# Patient Record
Sex: Male | Born: 1970 | Race: White | Hispanic: No | Smoking: Never smoker
Health system: Northeastern US, Academic
[De-identification: ages and names within clinical notes are randomized; demographics above are authoritative.]

## PROBLEM LIST (undated history)

## (undated) ENCOUNTER — Encounter

---

## 2015-11-04 ENCOUNTER — Ambulatory Visit

## 2015-11-17 ENCOUNTER — Ambulatory Visit

## 2016-01-11 ENCOUNTER — Ambulatory Visit

## 2016-01-11 NOTE — Progress Notes (Signed)
Visit Type:  Annual Physical  Referring Provider:  [name]  Primary Provider:  [name]      History of Present Illness:  This very pleasant 45 year old Morris came in for an annual checkup. Since his last visit he stopped his blood pressure medications. He has been losing some hair in a male pattern and read that some of the pills might contribute to this. Nevertheless his blood pressure readings have been high. His biggest complaint is that of fatigue. He is able to work. He sleeps well at night. He does not give a typical symptoms consequent to possible sleep apnea. He denies any chest pain or palpitations. He would like to lose some weight. He would like his testosterone checked again. He has had no gouty attacks on the allopurinol. His bowels have been good. He is not happy that I am retiring.  He continues to be the primary caregiver to his young daughters ages 74 and 27.      Current Problems- Reviewed during today's visit  ARTHRITIS  LIBIDO, DECREASED  ANXIETY  GOUT  UMBILICAL HERNIA  COLECTOMY, HX OF  HYPERTENSION  DIVERTICULOSIS, COLON  FATTY LIVER DISEASE  HYPERCHOLESTEROLEMIA  ROTATOR CUFF TEAR  PILONIDAL CYST    Current Medications- Reviewed during today's visit  VIAGRA TAB 100MG  (SILDENAFIL CITRATE): 1/2 - 1 by mouth daily as needed 1 hour prior to intercourse  AMLODIPINE BESY-BENAZEPRIL HCL 10-40 MG CAPS: one p.o. daily  ALLOPURINOL 300 MG TABS: take one by mouth daily  Current Allergies- Reviewed during today's visit  NO KNOWN ALLERGIES        Review of Systems   General: marked fatigue. Nevertheless he gets everything done for himself, his daughters and the household  Eyes: Denies visual change or blurring, eye pain.   Ears/Nose/Throat: Denies earache, decreased hearing, difficulty swallowing.   Cardiovascular: Denies chest pain or pressure, palpitations, shortness of breath.   Respiratory: Denies dry cough, productive cough, shortness of breath, wheezing.   Gastrointestinal: Denies acid indigestion,  nausea, vomiting, diarrhea, abdominal pain, change in bowel habits, constipation, mucous or blood in stools.   Musculoskeletal: Denies muscle cramps or aches, muscle weakness, morning stiffness, joint pain, joint swelling.   Skin: Denies dry skin, rash, skin ulcers, suspicious lesions, hx of skin cancer.   Psychiatric: Denies anxiety, depression, insomnia.     Vital Signs     Weight: 238 lb. Height: 70.5  in.    BMI: 33.79  BSA: 2.26    Wt chg: -1  Weight: 239 lbs   BMI: 33.93 on 08/21/2014  Pulse rate: 57    Pulse rhythm: regular    Respirations: 14  On Oxygen? No  BP: 164/108 - off medication, sitting right arm      Patient is not experiencing pain    Comments: electrocardiogram shows a sinus bradycardia. There is minor T-wave blunting. No diagnostic changes noted  Medications and Allergies Reviewed    Signed: Luz Brazen MD....January 11, 2016 3:12 PM  PHQ 2    Over the last 2 weeks, how often have you been bothered by any of the following problems?  1. Little interest or pleasure in doing things:  0   - Not at all  2. Feeling down, depressed, or hopeless:  0   - Not at all        Physical Exam    General:      pleasant, slightly heavyset Morris in no acute distress.  Head:      normocephalic and atraumatic.  Eyes:      anicteric  Ears:      normal conversational hearing  Nose:      no deformity, discharge, inflammation, or lesions.    Mouth:      no deformity or lesions with good dentition.    Neck:      no masses, thyromegaly, or abnormal cervical nodes.    Chest Wall:      no deformities or breast masses noted.    Breasts:      no masses or gynecomastia noted.  slightly endomorphic  Lungs:      clear bilaterally to auscultation.    Heart:      non-displaced PMI, chest non-tender; regular rate and rhythm, S1, S2 without murmurs, rubs, or gallops  Abdomen:      well healed scar from surgery. Nontender  Genitalia:      normal male, testes descended bilaterally without masses, no hernias, no varicoceles noted.   circumcised.    Msk:      no deformity or scoliosis noted of thoracic or lumbar spine.    Pulses:      pulses normal in all 4 extremities.    Extremities:      no clubbing, cyanosis, edema, or deformity noted with normal full range of motion of all joints.    Neurologic:      no focal deficits, cranial nerves II-XII grossly intact with normal sensation, reflexes, coordination, muscle strength and tone.    Skin:      intact without lesions or rashes.  tattoos on right arm and upper back  Cervical Nodes:      no significant adenopathy.    Axillary Nodes:      no significant adenopathy.    Inguinal Nodes:      no significant adenopathy.    Psych:      declines feeling depressed but he certainly has complaints of low energy level    Test Results:     WBC:     5.9    (08/28/2014)  Hemoglobin:    14.6    (08/28/2014)  Hematocrit:    41.9    (08/28/2014)  MCV:     92.7    (08/28/2014)  MCH:     32.3    (08/28/2014)  PLatelets:    189    (08/28/2014)  ESR:     12    (09/25/2013)  TSH Ult:    0.77    (08/28/2014)  Vit D 25-OH:    30    (01/20/2010)  Total Prot:    7.6    (08/28/2014)  Albumin:    4.4    (08/28/2014)  AST:     30    (08/28/2014)  ALT:     66    (08/28/2014)  ALK PHOS:    57    (08/28/2014)  Total Bilirubin:   0.4    (08/28/2014)  Total Cholesterol:   190    (08/28/2014)  LDL:     96    (08/28/2014)  LDL Direct:    121    09/25/2013)  HDL:     23    (08/28/2014)  Non HDL:    167    08/28/2014)  Triglycerides:    356    (08/28/2014)  Cardiac CRP:    2.7 mg/L    (01/20/2010)  Glucose:    98    (08/28/2014)  Sodium:    141    (08/28/2014)  Potassium:  4.4    (08/28/2014)  Chloride:    104    (08/28/2014)  Bicarbonate:    23    (08/28/2014)  BUN:     20    (08/28/2014)  Creatinine:    1.2    (08/28/2014)  eGFR:     65    (08/28/2014)  Serum Calcium:   9.3    (08/28/2014)  Uric Acid:    4.1    (08/28/2014)       Assessment and Plan:      ~ LIBIDO, DECREASED (R37):    will recheck a testosterone and thyroid  level     ~ GOUT (M10.9):    no problems on allopurinol alone     ~ HYPERTENSION (I10):    terrible blood pressure control off medication. He is willing to resume        Medications Removed Today:   PREDNISONE 10 MG TABS (PREDNISONE) 3 by mouth daily for 5 days for gout attack  PROBENECID 500 MG TABS (PROBENECID) one by mouth twice a day  METOPROLOL SUCCINATE 100 MG  TB24 (METOPROLOL SUCCINATE) take one by mouth each day    New/Revised  Medications Today:   AMLODIPINE BESY-BENAZEPRIL HCL 10-40 MG CAPS (AMLODIPINE BESY-BENAZEPRIL HCL) one p.o. daily          Care Plan  Plan: check labs and hormone level. Resume Lotrel. Weight reduction.    Prescriptions:  ALLOPURINOL 300 MG TABS (ALLOPURINOL) take one by mouth daily  #90 x 3   Entered and Authorized by: Luz Brazen MD   Signed by: Luz Brazen MD on 01/11/2016   Method used: Print then Give to Patient   RxID: [id number]  VIAGRA TAB 100MG  (SILDENAFIL CITRATE) 1/2 - 1 by mouth daily as needed 1 hour prior to intercourse  #18 x 3   Entered and Authorized by: Luz Brazen MD   Signed by: Luz Brazen MD on 01/11/2016   Method used: Print then Give to Patient   RxID: [id number]  AMLODIPINE BESY-BENAZEPRIL HCL 10-40 MG CAPS (AMLODIPINE BESY-BENAZEPRIL HCL) one p.o. daily  #90 x 3   Entered and Authorized by: Luz Brazen MD   Signed by: Luz Brazen MD on 01/11/2016   Method used: Print then Give to Patient   RxID: [id number]

## 2016-01-11 NOTE — Progress Notes (Signed)
Orders Added    Orders:  Added new Test order of CBC -CBC Only (No Diff)** (CBCO) - Signed  Added new Test order of BMP - Basic Metabolic Panel (BMP) - Signed  Added new Test order of UAR -Routine Urine (Micro Reflex) (UAR) - Signed  Added new Test order of TSHR -TSHR with Reflex** (TSHR) - Signed  Added new Test order of TESTO - Testosterone (TESTO) - Signed  Added new Test order of URIC - Uric Acid (URIC) - Signed  Added new Test order of LIVER - Liver Function Panel (LIVER) - Signed  Added new Test order of LIPID -Lipid Panel** (LIPR) - Signed  Added new Test order of GLYCO - A1C** (GLYCO.) - Signed

## 2016-01-15 ENCOUNTER — Ambulatory Visit

## 2016-01-15 LAB — HX RTN. URINE WITH REFLEX
HX ASCORBIC ACID URINE: NEGATIVE
HX URINE BILE: NEGATIVE
HX URINE BLOOD: NEGATIVE
HX URINE ESTERASE: NEGATIVE
HX URINE GLUCOSE: NEGATIVE
HX URINE KETONES: NEGATIVE
HX URINE NITRITE: NEGATIVE
HX URINE PH: 6 (ref 5.0–8.0)
HX URINE PROTEIN: NEGATIVE
HX URINE SPECIFIC GRAVITY: 1.018 (ref 1.003–1.03)
HX UROBILINOGEN, URINE: NEGATIVE

## 2016-01-15 LAB — HX LIVER FUNCTION PANELX
HX ALBUMIN: 4.3 g/dL (ref 3.2–4.9)
HX ALKALINE PHOSPHATASE: 58 U/L (ref 30.0–117.0)
HX ALT: 72 U/L — ABNORMAL HIGH (ref 0.0–40.0)
HX AST: 38 U/L — ABNORMAL HIGH (ref 0.0–37.0)
HX DIRECT BILIRUBIN: <0.2 (ref 0.0–0.3)
HX TOTAL BILIRUBIN: 0.4 mg/dL (ref 0.2–1.2)
HX TOTAL PROTEIN: 7.7 g/dL (ref 6.5–8.4)

## 2016-01-15 LAB — HX BASIC METABOLIC PANELX
HX ANION GAP: 14 mmol/L (ref 9.0–19.0)
HX BICARBONATE: 23 mmol/L (ref 22.0–29.0)
HX BUN: 20 mg/dL (ref 6.0–20.0)
HX CALCIUM: 9.1 mg/dL (ref 8.5–10.5)
HX CHLORIDE: 99 mmol/L (ref 98.0–110.0)
HX CREATININE: 1.1 mg/dL (ref 0.4–1.2)
HX GLOMERULAR FILTRATION RATE: 72
HX GLUCOSE: 105 mg/dL — ABNORMAL HIGH (ref 70.0–100.0)
HX HEMOLYSIS INDEX: 16 mg/dL (ref 0.0–50.0)
HX ICTERIC INDEX: 1 (ref 0.0–2.0)
HX LIPEMIC INDEX: 14 mg/dL (ref 0.0–40.0)
HX POTASSIUM: 4.3 mmol/L (ref 3.6–5.3)
HX SODIUM: 136 mmol/L — ABNORMAL LOW (ref 137.0–146.0)

## 2016-01-15 LAB — HX  COMPLETE BLOOD COUNT
HX HEMATOCRIT: 44.3 % (ref 41.0–53.0)
HX HEMOGLOBIN: 15.3 g/dL (ref 13.5–17.5)
HX MEAN CORP.HEMO.CONC.: 34.5 g/dL (ref 31.0–37.0)
HX MEAN CORPUSCULAR HEMOGLOBIN: 32.3 pg (ref 26.0–34.0)
HX MEAN CORPUSCULAR VOLUME: 93.7 fL (ref 80.0–100.0)
HX MEAN PLATELET VOLUME: 12.3 fL (ref 9.4–12.4)
HX PLATELET COUNT: 163 10*3/uL (ref 150.0–400.0)
HX RED BLOOD COUNT: 4.7 M/uL (ref 4.5–5.9)
HX RED CELL DISTRIBUTION WIDTH SD: 45.7 fL (ref 35.0–51.0)
HX WHITE BLOOD COUNT: 5.7 10*3/uL (ref 4.5–11.0)

## 2016-01-15 LAB — HX LIPID PANEL FASTINGX
HX CHD RISK ASSESMENT FACTORX: 7.7
HX CHOLESTEROL (LIPR): 192 mg/dL (ref <200)
HX HDL CHOLESTEROLX: 25 mg/dL — ABNORMAL LOW (ref 35.0–55.0)
HX LDL CHOLESTEROLX: 101 mg/dL (ref <130)
HX NON HDL CHOLESTEROLX: 167 mg/dL — ABNORMAL HIGH (ref <130)
HX TRIGLYCERIDES: 328 mg/dL — ABNORMAL HIGH (ref <150)

## 2016-01-15 LAB — HX URIC ACID (SERUM): HX URIC ACID (SERUM): 5.1 mg/dL (ref 3.4–7.0)

## 2016-01-15 LAB — HX TESTOSTERONE, TOTAL: HX TESTOSTERONE, TOTAL: 337 ng/dL (ref 249.0–836.0)

## 2016-01-15 LAB — HX GLYCOHEMOGLOBIN
HX ESTIMATED AVERAGE GLUCOSE: 100 mg/dL
HX GLYCOHEMOGLOBIN EQUIVALENT: 0.627
HX HEMOGLOBIN A1C: 5.1 % (ref 4.2–5.8)

## 2016-01-15 LAB — HX TSH WITH REFLEX: HX TSH WITH REFLEX: 0.94 u[IU]/mL (ref 0.27–4.2)

## 2016-01-18 ENCOUNTER — Ambulatory Visit

## 2016-01-18 NOTE — Progress Notes (Signed)
Luz Brazen, MD   [address]   [place], [place] [address]  Office: [phone] Fax: [phone]    January 18, 2016      Timothy Morris  [address]  [place], [place] [address]    Dear Timothy Morris,    I have received the results of your most recent labwork. The results are listed below:     Labs Your Value Normal Result Date   Total Cholesterol: 192 Goal: less than 200 01/15/2016   HDL (good cholesterol):  25 Normal male: 84 - 55   Normal male: 46 - 65 01/15/2016   LDL (bad cholesterol):  101 Goal: less than 130 01/15/2016   Triglycerides: 328 Goal: less than 200 01/15/2016   LDL-direct (bad cholesterol): 121 Goal: less than 130 09/25/2013   Cardiac CRP (helps predict risk of heart disease) 2.7 mg/L 0-1 = low risk  1-3 = average risk  >3 = high risk 01/20/2010   Hemoglobin A1C   (3 month sugar test) 5.1 Normal: 4.2  5.8 01/15/2016   Estimated Average Glucose  (3 month Average) 100 Goal: less than 150 01/15/2016   Urine microalbumin     Normal: < 20       Blood sugar 105 Normal Newborn to 10yr: 60 - 110   Normal 71yr and older: 70-100 01/15/2016   Creatinine (kidney function) 1.1 Normal: 0.4  1.2   01/15/2016   ALT (liver test) 72 Normal male: 0  40   Normal male: 0 - 31 01/15/2016   AST (liver test) 38 Normal male: 0  37   Normal male: 0 - 31 01/15/2016   Hematocrit   (blood count) 44.3 Normal male: 15  52   Normal male: 35- 47 01/15/2016   Vitamin D Level 30 Normal: 30 or more 01/20/2010   TSH (thyroid test)  Normal: 0.27  4.20    TSH (ultra thyroid test) 0.94 Normal: 0.27  4.20 01/15/2016   PSA (prostate test)  Normal: less than 4.0    Uric Acid (high in people with gout) 5.1 Normal male: 3.4 - 7.0   Normal male: 2.4 - 6.0 01/15/2016     Overall stable.  Slight elevation of Triglyceries and liver tests are consistent with some degree of fatty liver disorder.  Testosterone level is fine.  Weight loss and exercise will ameliorate,          Sincerely,        Luz Brazen MD

## 2016-03-08 ENCOUNTER — Ambulatory Visit: Admitting: Internal Medicine

## 2016-03-08 ENCOUNTER — Ambulatory Visit

## 2016-03-08 NOTE — Progress Notes (Signed)
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 N/A      Pt declined Flu and Tdap Injections today            Current Medications- Reviewed during today's visit  VIAGRA TAB 100MG  (SILDENAFIL CITRATE): 1/2 - 1 by mouth daily as needed 1 hour prior to intercourse  AMLODIPINE BESY-BENAZEPRIL HCL 10-40 MG CAPS: Daily for blood pressure  ALLOPURINOL 300 MG TABS: take one by mouth daily  HYDROCHLOROTHIAZIDE 12.5 MG  TABS: take one by mouth once daily in AM for blood pressure    Past Medical History  Hypertension  Gout   ED  Surgical History  Right Arthroscopy x 2   Right Rottor Cuff  RIH  Colectomy Sigmoid Dr [name]   Pilonodal Cyst  Family History  Father Healthy  Estranged  Mother Breast Cancer  RN at [employer]  Siblings  Sisters healthy  2017  Social History  Marital Status: Divorced.Marland KitchenHe ares for the children  Children:  2    Lives With: Childrens  Occupation:  [employer]       Risk Factors  Smoking Status:never smoked  Drug use: no  Alcohol use: no  HIV high risk behavior: no    Exercise: Yes  Exercise Comments:  job aerobic     Caffeine (drinks/day): 1  Sun exposure: rarely  Seatbelt use (%): 100  Family History MI in male age < 76: no  Family History MI in male age < 29: no          Review of Systems   General: Denies fever, chills, sweats, anorexia, fatigue, weakness, malaise, weight loss.   Eyes: Denies visual change or blurring, eye pain.   Ears/Nose/Throat: Denies earache, decreased hearing, difficulty swallowing.   Cardiovascular: Denies chest pain or pressure, palpitations, shortness of breath.   Respiratory: Denies dry cough, productive cough, shortness of breath, wheezing.   Gastrointestinal: Denies acid indigestion, nausea, vomiting, diarrhea, abdominal pain, change in bowel  habits, constipation, mucous or blood in stools.   Musculoskeletal: Complains of hx of gout. Denies muscle cramps or aches, muscle weakness, morning stiffness, joint pain, joint swelling. no joint issues since starting Allopurinol    Skin: Denies dry skin, rash, skin ulcers, suspicious lesions, hx of skin cancer.   Psychiatric: Denies anxiety, depression, insomnia.     Vital Signs     Weight: 237 lb. Height: 70.5  in.    BMI: 33.65  BSA: 2.26    Wt chg: -1  Weight: 238 lbs   BMI: 33.79 on 01/11/2016  Pulse rate: 62  On Oxygen? No  Pulse Ox (SpO2): 97 BP: 124/78 - large cuff, sitting left arm      Patient is not experiencing pain    Medications and Allergies Reviewed    Signed: [name].Marland KitchenMarland KitchenMarland KitchenSeptember 26, 2017 2:58 PM  PHQ 2    Over the last 2 weeks, how often have you been bothered by any of the following problems?  1. Little interest or pleasure in doing things:  0   - Not at all  2. Feeling down, depressed, or hopeless:  0   - Not at all        Physical Exam    General:  well developed, well nourished, in no acute distress.    Lungs:      clear bilaterally to auscultation.    Heart:      regular rhythm and normal rate.      Test Results:     Hemoglobin:    15.3    (01/15/2016)  Hematocrit:    44.3    (01/15/2016)  LDL:     101    (01/15/2016)  Hgb A1C:    5.1    (01/15/2016)  Glucose:    105    (01/15/2016)  Potassium:    4.3    (01/15/2016)  Creatinine:    1.1    (01/15/2016)       Assessment and Plan:      ~ GOUT (M10.9) - Improved    Medications:  ALLOPURINOL 300 MG TABS: take one by mouth daily.      ~ HYPERTENSION (I10) - Improved    blood pressure is monitored and maintained at optimal levels per JNC 8 Guidelines. Meds reviewed for appropriateness.   add HCTZ   Medications:  AMLODIPINE BESY-BENAZEPRIL HCL 10-40 MG CAPS: Daily for blood pressure,  ALLOPURINOL 300 MG TABS: take one by mouth daily.       Problems Reviewed  Med Compliance and SE's: Pt is compliant with meds with no side effects    Patient/Caregiver understand instructions and plan.    New/Revised  Medications Today:   AMLODIPINE BESY-BENAZEPRIL HCL 10-40 MG CAPS (AMLODIPINE BESY-BENAZEPRIL HCL) Daily for blood pressure  HYDROCHLOROTHIAZIDE 12.5 MG  TABS (HYDROCHLOROTHIAZIDE) take one by mouth once daily in AM for blood pressure            Patient Instructions    follow up up in three months    Prescriptions:  HYDROCHLOROTHIAZIDE 12.5 MG  TABS (HYDROCHLOROTHIAZIDE) take one by mouth once daily in AM for blood pressure  #90 x 4   Entered and Authorized by: [name] MD   Signed by: [name] MD on 03/08/2016   Method used: Electronically to      [id number] (retail)     [address]     [place], [place]  [address]      Ph: [phone]     Fax: [phone]   RxID: [id number]

## 2016-06-20 ENCOUNTER — Ambulatory Visit

## 2016-06-20 ENCOUNTER — Ambulatory Visit: Admitting: Internal Medicine

## 2016-06-20 LAB — HX LIVER FUNCTION PANELX
HX ALBUMIN: 4.3 g/dL (ref 3.2–4.9)
HX ALKALINE PHOSPHATASE: 70 U/L (ref 30.0–117.0)
HX ALT: 102 U/L — ABNORMAL HIGH (ref 0.0–40.0)
HX AST: 58 U/L — ABNORMAL HIGH (ref 0.0–37.0)
HX DIRECT BILIRUBIN: <0.2 (ref 0.0–0.3)
HX TOTAL BILIRUBIN: 0.4 mg/dL (ref 0.2–1.2)
HX TOTAL PROTEIN: 7.9 g/dL (ref 6.5–8.4)

## 2016-06-20 LAB — HX LIPID PANEL FASTINGX
HX CHD RISK ASSESMENT FACTORX: 7.9
HX CHOLESTEROL (LIPR): 205 mg/dL — ABNORMAL HIGH (ref <200)
HX HDL CHOLESTEROLX: 26 mg/dL — ABNORMAL LOW (ref 35.0–55.0)
HX NON HDL CHOLESTEROLX: 179 mg/dL — ABNORMAL HIGH (ref <130)
HX TRIGLYCERIDES: 549 mg/dL — ABNORMAL HIGH (ref <150)

## 2016-06-20 LAB — HX CREATININE & EGFRX
HX CREATININE: 1.1 mg/dL (ref 0.4–1.2)
HX GLOMERULAR FILTRATION RATE: 72
HX HEMOLYSIS INDEX: 10 mg/dL (ref 0.0–50.0)
HX ICTERIC INDEX: 1 (ref 0.0–2.0)
HX LIPEMIC INDEX: 53 mg/dL — ABNORMAL HIGH (ref 0.0–40.0)

## 2016-06-20 LAB — HX  COMPLETE BLOOD COUNT
HX HEMATOCRIT: 42.5 % (ref 41.0–53.0)
HX HEMOGLOBIN: 15.2 g/dL (ref 13.5–17.5)
HX MEAN CORP.HEMO.CONC.: 35.8 g/dL (ref 31.0–37.0)
HX MEAN CORPUSCULAR HEMOGLOBIN: 32.8 pg (ref 26.0–34.0)
HX MEAN CORPUSCULAR VOLUME: 91.8 fL (ref 80.0–100.0)
HX MEAN PLATELET VOLUME: 12.7 fL — ABNORMAL HIGH (ref 9.4–12.4)
HX PLATELET COUNT: 180 10*3/uL (ref 150.0–400.0)
HX RED BLOOD COUNT: 4.6 M/uL (ref 4.5–5.9)
HX RED CELL DISTRIBUTION WIDTH SD: 45.8 fL (ref 35.0–51.0)
HX WHITE BLOOD COUNT: 6.4 10*3/uL (ref 4.5–11.0)

## 2016-06-20 LAB — HX ELECTROLYTESX
HX ANION GAP: 12 mmol/L (ref 9.0–19.0)
HX BICARBONATE: 29 mmol/L (ref 22.0–29.0)
HX CHLORIDE: 101 mmol/L (ref 98.0–110.0)
HX POTASSIUM: 4.1 mmol/L (ref 3.6–5.3)
HX SODIUM: 142 mmol/L (ref 137.0–146.0)

## 2016-06-20 LAB — HX BUN: HX BUN: 14 mg/dL (ref 6.0–20.0)

## 2016-06-20 LAB — HX GLUCOSEX: HX GLUCOSE: 86 mg/dL (ref 70.0–100.0)

## 2016-06-20 LAB — HX LDL CHOLESTEROL (DIRECT): HX LDL CHOLESTEROL (DIRECT): 103 mg/dL (ref <130)

## 2016-06-20 NOTE — Progress Notes (Signed)
Visit Type:  Follow-up Visit  Referring Provider:  [name]  Primary Provider:  [name] MD      History of Present Illness:  Timothy Morris is a 47 Years Old Male who presents today for: Follow up  Specialists seen since last visit? None      Pt declined Flu and Tdap Injections today      Past Medical History  Hypertension  Gout   ED  Surgical History  Right Arthroscopy x 2   Right Rottor Cuff  RIH  Colectomy Sigmoid Dr [name]   Pilonodal Cyst  Family History  Father Healthy  Estranged  Mother Breast Cancer  RN at [employer]  Siblings  Sisters Healthy  2018  Social History  Marital Status: Divorced.Marland KitchenHe for the children  Children:  2    Lives With: Childrens  Occupation:  [employer]       Risk Factors  Smoking Status:never smoked  Drug use: no  Alcohol use: no  HIV high risk behavior: no    Exercise: Yes  Exercise Comments:  job aerobic     Caffeine (drinks/day): 1  Sun exposure: rarely  Seatbelt use (%): 100  Family History MI in male age < 11: no  Family History MI in male age < 52: no            Vital Signs     Weight: 246 lb. Height: 70.5  in.    BMI: 34.92  BSA: 2.29    Wt chg: 9  Weight: 237 lbs   BMI: 33.65 on 03/08/2016  Pulse rate: 66  On Oxygen? No  Pulse Ox (SpO2): 98 BP: 164/99 - large cuff, sitting right arm      Patient is not experiencing pain    Medications and Allergies Reviewed    Signed: [name]....June 20, 2016 3:10 PM  PHQ 2    Over the last 2 weeks, how often have you been bothered by any of the following problems?  1. Little interest or pleasure in doing things:  0   - Not at all  2. Feeling down, depressed, or hopeless:  0   - Not at all             Assessment and Plan:

## 2016-06-21 ENCOUNTER — Ambulatory Visit: Admitting: Internal Medicine

## 2016-06-21 NOTE — Telephone Encounter (Signed)
Phone Note -     Outgoing Call    Initial call taken by: [name] MD,  June 21, 2016 7:29 AM  Summary of Call: call and let this patient know all of the recent blood tests were normal except the cholesterol which is high  He should lose weight as we discussed and this will be rechecked in 6 months to see if medication to lower the cholesterol will be necessary         Follow-up #1  Action: Phone call completed, Patient Notified  By: Talbert Cage Lubeck ~ June 21, 2016 9:32 AM

## 2016-12-19 ENCOUNTER — Ambulatory Visit

## 2016-12-19 ENCOUNTER — Ambulatory Visit: Admitting: Internal Medicine

## 2016-12-19 NOTE — Progress Notes (Signed)
Visit Type:  Follow-up Visit  Referring Provider:  [name]  Primary Provider:  [name] MD      History of Present Illness:  Timothy Morris is a 46 Years Old Male who presents today for: Follow up fpor hypertension  Recently by following a weight loss program "[employer]" he has lost over 25 pounds in the last 3 months    Specialists seen since last visit? None      prior to visit chart was reviewed and needed clinical screening, lab testing,  discussed with medical assistant        Current Medications- Reviewed during today's visit  VIAGRA 100 MG ORAL TABLET (SILDENAFIL CITRATE): 1/2 - 1 by mouth daily as needed 1 hour prior to intercourse  AMLODIPINE BESY-BENAZEPRIL HCL 10-40 MG ORAL CAPSULE: Daily for blood pressure  ALLOPURINOL 300 MG ORAL TABLET: take one by mouth daily  HYDROCHLOROTHIAZIDE 12.5 MG ORAL TABLET: take one by mouth once daily in AM for blood pressure    Past Medical History  Hypertension  Gout   ED  Surgical History  Right Arthroscopy x 2   Right Rottor Cuff  RIH  Colectomy Sigmoid Dr [name]   Pilonodal Cyst  Family History  Father Healthy  Estranged  Mother Breast Cancer  RN at [employer]  Siblings  Sisters Healthy  2018  Social History  Marital Status: Divorced.Marland KitchenHe for the children  Children:  2    Lives With: Childrens  Occupation:  [employer]       Risk Factors  Tobacco User: no  Smoking Status:never smoked  Drug use: no  Alcohol use: no  HIV high risk behavior: no    Exercise: Yes  Exercise Comments:  job aerobic     Caffeine (drinks/day): 1  Sun exposure: rarely  Seatbelt use (%): 100  Family History MI in male age < 70: no  Family History MI in male age < 45: no          Review of Systems   General: Denies fever, chills, sweats, anorexia, fatigue, weakness, malaise, weight loss.   Cardiovascular: Denies chest pain or pressure, palpitations, shortness of breath.   Respiratory: Denies dry cough, productive cough, shortness of breath, wheezing.   Gastrointestinal: Denies acid indigestion,  nausea, vomiting, diarrhea, abdominal pain, change in bowel habits, constipation, mucous or blood in stools.   Musculoskeletal: Complains of morning stiffness, back pain. Denies muscle cramps or aches, muscle weakness, joint pain, joint swelling. mid and lower back pain     Vital Signs     Patient: 46 Years Old Male  Height:  70.5 in.  Weight: 209 lbs      Wt Chg: -37 since 06/20/2016  BMI:  29.67        34.92 on 06/20/2016  BP:  124/79 left arm, large cuff, seated     164/99 on 06/20/2016   Pulse:  72         Pulse Ox: 98 %  On Oxygen: No    Patient is experiencing Pain  Location: back/hand    Type: atrain/cramping   Duration: intermittant    Medications and Allergies Reviewed    Signed: [name]....December 19, 2016 2:24 PM    PHQ 2    Over the last 2 weeks, how often have you been bothered by any of the following problems?  1. Little interest or pleasure in doing things:  0   - Not at all  2. Feeling down, depressed, or hopeless:  0   -  Not at all        Physical Exam    General:      well developed, well nourished, in no acute distress.   Lungs:      clear bilaterally to auscultation.    Heart:      regular rhythm and normal rate.    Msk:      no deformity or scoliosis noted of thoracic or lumbar spine.  decreased ROM.  flexion "tight"          Assessment and Plan:      ~ HYPERTENSION (I10) :    today's blood pressure was good and meets JNC 8 guidelines.  Patient is compliant with medications and lifestyle recommendations.  No change to current regimen made today        ~ BACK PAIN, ACUTE (M54.89) :    stretching program provided       Problems Reviewed            Patient Instructions    Follow up in 6 months  Limit Salt intake to 3 grams daily  Exercise at aerobic capacity 150 minutes weekly  A healthy Body Mass Index is 26 or lower  Follow a Mediterranean Diet Plan  Please be sure to register and correspond with me  through the [facility] Patient Portal at [email or web address]  Help Number  [phone]                 ]

## 2017-01-31 ENCOUNTER — Ambulatory Visit

## 2017-02-03 ENCOUNTER — Ambulatory Visit

## 2017-02-03 ENCOUNTER — Ambulatory Visit: Admitting: Internal Medicine

## 2017-02-03 LAB — HX GLUCOSEX: HX GLUCOSE: 83 mg/dL (ref 70.0–100.0)

## 2017-02-03 LAB — HX  COMPLETE BLOOD COUNT
HX HEMATOCRIT: 42.1 % (ref 41.0–53.0)
HX HEMOGLOBIN: 14.3 g/dL (ref 13.5–17.5)
HX MEAN CORP.HEMO.CONC.: 34 g/dL (ref 31.0–37.0)
HX MEAN CORPUSCULAR HEMOGLOBIN: 31.5 pg (ref 26.0–34.0)
HX MEAN CORPUSCULAR VOLUME: 92.7 fL (ref 80.0–100.0)
HX MEAN PLATELET VOLUME: 13.3 fL — ABNORMAL HIGH (ref 9.4–12.4)
HX PLATELET COUNT: 171 10*3/uL (ref 150.0–400.0)
HX RED BLOOD COUNT: 4.5 M/uL (ref 4.5–5.9)
HX RED CELL DISTRIBUTION WIDTH SD: 45.5 fL (ref 35.0–51.0)
HX WHITE BLOOD COUNT: 6 10*3/uL (ref 4.5–11.0)

## 2017-02-03 LAB — HX CALCIUM: HX CALCIUM: 9.5 mg/dL (ref 8.5–10.5)

## 2017-02-03 LAB — HX ELECTROLYTESX
HX ANION GAP: 17 mmol/L (ref 9.0–19.0)
HX BICARBONATE: 23 mmol/L (ref 22.0–29.0)
HX CHLORIDE: 103 mmol/L (ref 98.0–110.0)
HX POTASSIUM: 4.4 mmol/L (ref 3.5–5.1)
HX SODIUM: 143 mmol/L (ref 137.0–146.0)

## 2017-02-03 LAB — HX CREATININE & EGFRX
HX CREATININE: 1.2 mg/dL (ref 0.4–1.2)
HX GLOMERULAR FILTRATION RATE: 65
HX HEMOLYSIS INDEX: 5 mg/dL (ref 0.0–50.0)
HX ICTERIC INDEX: 1 (ref 0.0–2.0)
HX LIPEMIC INDEX: 13 mg/dL (ref 0.0–40.0)

## 2017-02-03 LAB — HX BUN: HX BUN: 21 mg/dL — ABNORMAL HIGH (ref 6.0–20.0)

## 2017-02-03 LAB — HX LIVER FUNCTION PANELX
HX ALBUMIN: 4.5 g/dL (ref 3.2–4.9)
HX ALKALINE PHOSPHATASE: 56 U/L (ref 30.0–117.0)
HX ALT: 22 U/L (ref 0.0–40.0)
HX AST: 23 U/L (ref 0.0–37.0)
HX DIRECT BILIRUBIN: <0.2 (ref 0.0–0.3)
HX TOTAL BILIRUBIN: 0.3 mg/dL (ref 0.2–1.2)
HX TOTAL PROTEIN: 7.4 g/dL (ref 6.5–8.4)

## 2017-02-03 LAB — HX URIC ACID (SERUM): HX URIC ACID (SERUM): 6.6 mg/dL (ref 3.4–7.0)

## 2017-02-03 NOTE — Progress Notes (Signed)
Visit Type:  Follow-up Visit  Referring Provider:  [name]  Primary Provider:  [name] MD      History of Present Illness:  Timothy Morris is a 46 Years Old Male who presents today for: Follow up  really becasue he is worried that a diet supplement he is taking to help him lose weight may be dangerous  We went over the product last visit and determined it was mostly caffeine at high doses  He would like blood tests to be done to insure no metabolic damage has been done  Also he is having more or less chronic back pain He has been seeing a chiropractor who told him after xray that "his back was loaded with arthritis" and wants to see him weekly for the foreseeable future   He does feels better after their sessions but it only last a day or so He is not on any rehab PT program     Specialists seen since last visit? None        Current Medications- Reviewed during today's visit  VIAGRA 100 MG ORAL TABLET (SILDENAFIL CITRATE): 1/2 - 1 by mouth daily as needed 1 hour prior to intercourse  AMLODIPINE BESY-BENAZEPRIL HCL 10-40 MG ORAL CAPSULE: Daily for blood pressure  ALLOPURINOL 300 MG ORAL TABLET: take one by mouth daily  HYDROCHLOROTHIAZIDE 12.5 MG ORAL TABLET: take one by mouth once daily in AM for blood pressure    Past Medical History  Hypertension  Gout   ED  Surgical History  Right Arthroscopy x 2   Right Rottor Cuff  RIH  Colectomy Sigmoid Dr [name]   Pilonodal Cyst  Family History  Father Healthy  Estranged  Mother Breast Cancer  RN at [employer]  Siblings  Sisters Healthy  2018  Social History  Marital Status: Divorced.Marland KitchenHe for the children  Children:  2    Lives With: Childrens  Occupation:  [employer]       Risk Factors  Tobacco User: no  Smoking Status:never smoked  Passive smoke exposure: No    Drug use: no  Alcohol use: no  HIV high risk behavior: no    Exercise: Yes  Exercise Comments:  job aerobic     Caffeine (drinks/day): 1  Sun exposure: rarely  Seatbelt use (%): 100  Family History MI in male age  < 74: no  Family History MI in male age < 79: no        Review of Systems   General: Denies fever, chills, sweats, anorexia, fatigue, weakness, malaise, weight loss. never felt better   Eyes: Denies visual change or blurring, eye pain.   Ears/Nose/Throat: Denies earache, decreased hearing, difficulty swallowing.   Cardiovascular: Denies chest pain or pressure, palpitations, shortness of breath.   Respiratory: Denies dry cough, productive cough, shortness of breath, wheezing.   Gastrointestinal: Denies acid indigestion, nausea, vomiting, diarrhea, abdominal pain, change in bowel habits, constipation, mucous or blood in stools.   Musculoskeletal: Complains of back pain. Denies muscle cramps or aches, muscle weakness, morning stiffness, joint pain, joint swelling. back xray done ?arthritis  Skin: Denies dry skin, rash, skin ulcers, suspicious lesions.   Psychiatric: Denies anxiety, depression, insomnia.     Vital Signs     Patient: 46 Years Old Male  Height:  70.5 in.  Weight: 201 lbs      Wt Chg: -8 since 12/19/2016  BMI:  28.54        29.67 on 12/19/2016  BP:  114/70 left arm, normal  cuff, seated     124/79 on 12/19/2016   Pulse:  55         Pulse Ox: 97 %  On Oxygen: No    Patient is experiencing Pain  Location: back    Type: stabbing   Duration: constant    Medications and Allergies Reviewed    Signed: [name].Marland KitchenMarland KitchenMarland KitchenAugust 24, 2018 1:11 PM    PHQ 2    Over the last 2 weeks, how often have you been bothered by any of the following problems?  1. Little interest or pleasure in doing things:  0   - Not at all  2. Feeling down, depressed, or hopeless:  0   - Not at all        Physical Exam    General:      well developed, well nourished, in no acute distress.   Lungs:      clear bilaterally to auscultation.    Heart:      regular rhythm and normal rate.    Neurologic:      no focal deficits  Skin:      intact without lesions or rashes.  tattoos on right arm and upper back  Psych:      declines feeling depressed  but he certainly has complaints of low energy level         Assessment and Plan:     As for the supplemet I told him that he may be getting a extra sense of well being from the high dose of caffiene which may be addictive and problematic as time goes on, and since he is at  [employer] this would be a good time to cut back on the product        ~ BACK PAIN, LUMBAR (M54.5) :    suggested home PT program through handout provided nad that yoga might be a great exercise to get incolved in  Orders: Lumbar Spine Xray - LSPINE with follow up per results      ~ HYPERTENSION (I10) :    today's blood pressure was good and meets JNC 8 guidelines.  Patient is compliant with medications and lifestyle recommendations.  No change to current regimen made today         Problems Reviewed            Patient Instructions      Limit Salt intake to 3 grams daily  Exercise at aerobic capacity 150 minutes weekly  A healthy Body Mass Index is 26 or lower  Follow a Mediterranean Diet Plan  Please be sure to register and correspond with me  through the [facility] Patient Portal at [email or web address]  Help Number [phone]

## 2017-02-04 ENCOUNTER — Ambulatory Visit: Admitting: Internal Medicine

## 2017-02-04 ENCOUNTER — Ambulatory Visit

## 2017-02-04 NOTE — Telephone Encounter (Signed)
Phone Note -       Initial call taken by: [name] MD,  February 04, 2017 7:54 AM  Initial Details of Call:  call and let this patient know all of the recent blood tests were normal  and the back xray showed only milder arthritis         Follow-up #1  Action: Phone call completed, Patient Notified  By: Fonnie Jarvis [place] ~ February 06, 2017 1:27 PM

## 2017-05-01 ENCOUNTER — Ambulatory Visit: Admitting: Medical

## 2017-05-01 ENCOUNTER — Ambulatory Visit

## 2017-05-02 ENCOUNTER — Ambulatory Visit

## 2017-05-02 NOTE — Telephone Encounter (Signed)
Phone Note -     Outgoing Call    Follow up Call:: Post ER  Initial call taken by: Theodoro Kos,  May 02, 2017 8:58 AM  Detail: paronychia left index finger  Summary of Call: pt went to er has infection on finger they popped whatever was there he feels fine    Action Taken: Phone Call Completed

## 2017-05-08 ENCOUNTER — Ambulatory Visit

## 2017-05-08 NOTE — Progress Notes (Signed)
Main [address]   [address], [place] [address]  Office: [phone] Fax: [phone]              May 08, 2017    Dear Vernia Buff,    I am following up on your visit to the emergency room on 05-01-17.   Please contact my office to set up an appointment if you need further follow up.   It appears from the report I received, that your symptoms could have been treated in a more convenient and less costly location of care.    As your Primary Care Physician, I strive to provide you with the highest quality and most convenient care possible.  During my office hours, I will make every effort to see you on the same day when you are sick or I will instruct you what to do until I can see you.      I also want to make you aware that I, or my covering physician, are available to you at all times. If you are sick after hours, during the night, on weekends or holidays, please call my number, leave a message with the answering service and my on-call covering physician will call you back to review your symptoms over the phone.  The covering physician may offer you a treatment plan or will advise you if they think you should go to our Urgent Care Center or the Emergency Room.     The [facility] are very convenient and offer care for non-emergent needs when I am not available.  The Center offers quick access to care and for most health plans, the co-pay is much lower than the co-pay for an emergency room visit.  Following are the locations of our centers:     [facility], [place] at [facility]   Phone: [phone]  [address] Floor ([address])  Open every day  Monday - Friday 9 am - 9 pm  Weekends & Holidays - 9 am - 5 pm    [facility] [place] / [address]  Phone: [phone]  [address], [place]  [address]  Open every day  Monday - Friday 8 am - 7pm  Weekends & Holidays - 9am - 5pm    As always,  for life-threatening emergencies, call 911 or GO IMMEDIATELY TO THE NEAREST EMERGENCY ROOM.    Sincerely,    Dr. Haydee Salter

## 2017-06-20 ENCOUNTER — Ambulatory Visit

## 2017-06-20 NOTE — Progress Notes (Signed)
Visit Type:  Follow-up Visit  Referring Provider:  [name]  Primary Provider:  [name] MD      History of Present Illness:  Timothy Morris is a 47 Years Old Male who presents today for: Follow up for blood pressure check   Specialists seen since last visit? None    States he is here to follow up on Blood pressure. He does not check at home. He is active at work on his feet all day. He does not exercise outside of work. His diet has not been good with the holidays but states he is now trying to eat more consciously.     No complaints with paronychia of left index finger, quickly resolved after visit to ED.     Pt declined Flu Injection today      Past Medical History  Hypertension  Gout   ED  Surgical History  Right Arthroscopy x 2   Right Rottor Cuff  RIH  Colectomy Sigmoid Dr [name]   Pilonodal Cyst  Family History  Father Healthy  Estranged  Mother Breast Cancer  RN at [employer]  Siblings  Sisters Healthy  2018  Social History  Marital Status: Divorced.  Children:  2    Lives With: Childrens  Occupation:  [employer]       Risk Factors  Tobacco User: no  Smoking Status:never smoked  Passive smoke exposure: No    Drug use: no  Alcohol use: no  HIV high risk behavior: no    Exercise: Yes  Exercise Comments:  job aerobic     Caffeine (drinks/day): 1  Sun exposure: rarely  Seatbelt use (%): 100  Family History MI in male age < 67: no  Family History MI in male age < 92: no        Review of Systems   General: Denies fever, chills, sweats, anorexia, fatigue, weakness, malaise, weight loss.   Eyes: Denies visual change or blurring, eye pain.   Ears/Nose/Throat: Denies earache, decreased hearing, difficulty swallowing.   Cardiovascular: Denies chest pain or pressure, palpitations, shortness of breath.   Respiratory: Denies dry cough, productive cough, shortness of breath, wheezing.   Gastrointestinal: Denies acid indigestion, nausea, vomiting, diarrhea, abdominal pain, change in bowel habits, constipation, mucous or  blood in stools.   Genitourinary: Denies dysuria, decreased urinary stream, nocturia, erectile dysfunction, testicular pain or masses.   Musculoskeletal: Denies muscle cramps or aches, muscle weakness, morning stiffness, joint swelling. Joint pain "all over" unchanged from past     Vital Signs     Patient: 47 Years Old Male  Height:  70.5 in.  Weight: 219 lbs      Wt Chg: 18 since 02/03/2017  BMI:  31.09        28.54 on 02/03/2017  BP:  124/78 left arm, normal cuff, seated     114/70 on 02/03/2017   Pulse:  66         Pulse Ox: 98 %  On Oxygen: No    Patient is not experiencing pain    Medications and Allergies Reviewed    Signed: [name]....June 20, 2017 8:16 AM    PHQ 2    Over the last 2 weeks, how often have you been bothered by any of the following problems?  1. Little interest or pleasure in doing things:  0   - Not at all  2. Feeling down, depressed, or hopeless:  0   - Not at all    Laboratory Data  Glucose: 94    Lipid Panel   Cholesterol: 224  HDL: 35  LDL: 135  Triglycerides: 265  Cholesterol/HDL Ratio: 6.3   New Orders:  Patient Encounter [[id number]]  TDaP (Boostrix, Adacel) [[id number]]  OV Est Level III [[id number]]        Vaccines Ordered: Tdap  Ordering Provider:  [name] MD  Vaccine Counselling by Provider     Vaccines Given  Tdap     Dose:  0.5 mL  Given By:  [name]  Manufacturer:  [employer] P[employer]     Lot Number:  [id number]     Expiration Date:  05/28/2019  Vaccine Type:  Adacel [CVX115]  NDC Number:  [id number]  VIS Version Provided, Date:  06/20/2017     Source:  Private Purchase  Route:  IM     Site:  Right Deltoid    Nursing comments: Adacel Injection was administered by Dr Nathaneil Canary and documented by Cecilie Kicks [place]        Physical Exam    General:      well developed, well nourished, in no acute distress.    Head:      normocephalic and atraumatic.    Eyes:      PERRL/EOM intact, conjunctiva and sclera clear with out nystagmus.    Chest Wall:      no deformities  or breast masses noted.    Lungs:      clear bilaterally to auscultation.    Heart:      non-displaced PMI, chest non-tender; regular rate and rhythm, S1, S2 without murmurs, rubs, or gallops  Abdomen:       normal bowel sounds; no hepatosplenomegaly no ventral,umbilical hernias or masses noted.    Pulses:      pulses normal in all 4 extremities.    Skin:      intact without lesions or rashes.  tattoos on right arm and upper back  Psych:      declines feeling depressed but he certainly has complaints of low energy level         Assessment and Plan:        ~ OBESITY (E66.9)   Counseled on diet and exercise. Has gained 18 lbs since last visit in August.    GOUT (M10.9)    Well controlled. Continue Allopurinol    HYPERTENSION (I10)  BP 154/81 today. JNC8 guidelines suggest goal <140/90. Stated takes amlodipine and HCTZ consistently. Counseled on diet and exercise changes to help reach goa. Medication adjustment not suggested at this time given mild elevation above goal in office, he does not check at home. ASCVD 10 yr risk 8.0%. USPSTF guidelines suggest 10% for statin initiation, hold for now.               Patient Instructions    Follow Up in 6 month    Limit Salt intake to 3 grams daily  Exercise at aerobic capacity 150 minutes weekly  A healthy Body Mass Index is 26 or lower  Follow a Mediterranean Diet Plan  Please be sure to register and correspond with me  through the [facility] Patient Portal at [email or web address]  Help Number [phone]         Medications:  HYDROCHLOROTHIAZIDE 12.5 MG ORAL TABLET (HYDROCHLOROTHIAZIDE) take one by mouth once daily in AM for blood pressure  #90[Tablet] x 3   Route:ORAL   Entered and Authorized by: [name] MD   Signed by: [name] MD on 06/20/2017   Method used:  Electronically to      [id number] (retail[facility]     [address]     [place], [place]  [address]     Ph: [phone] or [phone]     Fax: [phone]   Note to Pharmacy: Route: ORAL;     RxID: [id number]  AMLODIPINE BESY-BENAZEPRIL HCL 10-40 MG ORAL CAPSULE (AMLODIPINE BESY-BENAZEPRIL HCL) Daily for blood pressure  #90[Capsule] x 3   Route:ORAL   Entered and Authorized by: [name] MD   Signed by: [name] MD on 06/20/2017   Method used: Electronically to      [id number]ail[facility]     [address]     [place], [place]  [address]     Ph: [phone] or [phone]     Fax: [phone]   Note to Pharmacy: Route: ORAL;    RxID: [id number]

## 2017-07-11 ENCOUNTER — Ambulatory Visit

## 2017-07-11 NOTE — ED Provider Notes (Signed)
Emergency Report      Imported By: Theodoro Kos 07/11/2017 4:13:54 PM    _____________________________________________________________________    External Attachment:      Type: Image      Comment: External Document

## 2017-09-01 ENCOUNTER — Ambulatory Visit

## 2017-11-27 ENCOUNTER — Ambulatory Visit

## 2017-12-18 ENCOUNTER — Ambulatory Visit

## 2017-12-18 NOTE — Progress Notes (Signed)
Main [address] [facility]   [address], [place] [address]  Office: [phone] Fax: [phone]                12/18/2017    Timothy Morris  [address]   [place], [place]  [address]      Dear Mr. [place]:    We had an appointment reserved for you today, and we were sorry  not to see you.    Since the doctor felt it was important to see you, please call our office  as soon as it is convenient so we may reschedule your appointment.      Sincerely,      [facility] [employer]

## 2018-01-02 ENCOUNTER — Ambulatory Visit

## 2018-01-02 ENCOUNTER — Ambulatory Visit: Admitting: Emergency Medicine

## 2018-01-03 ENCOUNTER — Ambulatory Visit

## 2018-01-03 NOTE — Telephone Encounter (Signed)
Phone Note -     Outgoing Call    Follow up Call:: Post ER  Initial call taken by: Fonnie Jarvis [place],  January 03, 2018 11:12 AM  Call placed to: Patient  Detail: follow up urgent care [facility]   Summary of Call: tried to reach patient due to he was in the urgent care yesterday due to right finger pain left message for patient to call back the office     Action Taken: Left Message for Patient    Follow-up #1  Details: spoke with patient states he broke his finger but is doing okay will call us back if he needs anything   Action: Phone call completed  By: Fonnie Jarvis [place] ~ January 04, 2018 8:30 AM

## 2018-01-12 ENCOUNTER — Ambulatory Visit

## 2018-01-12 NOTE — Progress Notes (Signed)
Visit Type:  Follow-up Visit  Referring Provider:  [name]  Primary Provider:  [name] MD      History of Present Illness:  Timothy Morris is a 47 Years Old Male who presents today for: Follow up after wndwow came down on his middle finger and he suffered a subungalhrmatoma and distal phalnyx fracture   Hematoma was drained and finger splinted  He has taken the splint off because he is unable to work with it on      Specialists seen since last visit? None      Findings:            AP view right hand and coned down radiographs of the third digit  were      obtained. There is an acute nondisplaced fracture seen distal phalanx      third digit right hand.. There is associated soft tissue swelling. No      other fractures seen.            Impression:            Acute nondisplaced fracture distal phalanx third digit right hand.            Dictated: 01/03/18 0719      Current Medications- Reviewed during today's visit  VIAGRA 100 MG ORAL TABLET (SILDENAFIL CITRATE): 1/2 - 1 by mouth daily as needed 1 hour prior to intercourse  AMLODIPINE BESY-BENAZEPRIL HCL 10-40 MG ORAL CAPSULE: Daily for blood pressure  ALLOPURINOL 300 MG ORAL TABLET: take one by mouth daily  HYDROCHLOROTHIAZIDE 12.5 MG ORAL TABLET: take one by mouth once daily in AM for blood pressure      Past Medical History  Hypertension  Gout   ED    Surgical History  Right Arthroscopy x 2   Right Rottor Cuff  RIH  Colectomy Sigmoid Dr [name]   Pilonodal Cyst    Family History  Father Healthy  Estranged  Mother Breast Cancer  RN at [employer]  Siblings  Sisters Healthy  2018    Social History  Marital Status: Divorced.  Children:  2    Lives With: Childrens  Occupation:  [employer]       Risk Factors  Smoking Status: never smoked  Passive smoke exposure: No    Drug use: no  Alcohol use: no  HIV high risk behavior: no    Exercise: Yes  Exercise Comments:  job aerobic     Caffeine (drinks/day): 1  Sun exposure: rarely  Seatbelt use (%): 100  Family History MI in  male age < 64: no  Family History MI in male age < 48: no        Review of Systems   General: Denies fever, chills, sweats, anorexia, fatigue, weakness, malaise, weight loss.   Musculoskeletal: right third distal finger pain     Vital Signs     Patient: 47 Years Old Male  Height:  70.5 in.  Weight: 210 lbs      Wt Chg: -9 since 06/20/2017  BMI:  29.81        31.09 on 06/20/2017  BP:  121/77 right arm, large cuff, seated     124/78 on 06/20/2017   Pulse:  59         Pulse Ox: 99 %  On Oxygen: No    Patient is not experiencing pain    Medications and Allergies Reviewed    Signed: [name].Marland KitchenMarland KitchenMarland KitchenAugust  2, 2019 8:11 AM    PHQ 2  Over the last 2 weeks, how often have you been bothered by any of the following problems?  1. Little interest or pleasure in doing things:  0   - Not at all  2. Feeling down, depressed, or hopeless:  0   - Not at all        Physical Exam    General:      well developed, well nourished, in no acute distress.    Extremities:      R Middle Finger  dital aspect mildly swollen  slight eccymosis  no deformity  normal flexion and extension        Impression and Recommendations:     ~DISPLACED FRACTURE OF DISTAL PHALANX OF UNSPECIFIED FINGER, SUBSEQUENT ENCOUNTER FOR FRACTURE WITH MALUNION (Z61.096E)  he understands that if he does not wear splint there may be a malunion of the extensor tendon leading to mallet finger           Problems Reviewed  Orders:   Added new Service order of Patient Encounter ([id number]) - Signed  Added new Service order of OV Est Level III ([id number]) - Signed    Patient Instructions    Follow up for blood pressure check in no more than 6 months

## 2018-02-27 ENCOUNTER — Ambulatory Visit

## 2018-05-29 ENCOUNTER — Ambulatory Visit

## 2018-05-29 NOTE — Telephone Encounter (Signed)
Phone Note -     Patient    Call back at Ph1 [phone]  Initial call taken by: Theodoro Kos,  May 29, 2018 11:58 AM  Actual Caller: Patient  Call For: Physician  Initial Details of Call:  pt gout is acting up asking if he can get something called into pharmacy or does he need to come to office?    Reason for Call: Acute Illness    Follow-up #1  Details: med sent   By: [name] MD ~ May 29, 2018 12:26 PM    Action: Phone Call Completed, Patient Notified  By: Theodoro Kos ~ May 29, 2018 1:52 PM      Medications:  COLCHICINE 0.6 MG ORAL CAPSULE (COLCHICINE) 2 tabs by mouth at onset of gout, then 1 an hour later  #9[Capsule] x 3   Route:ORAL   Entered and Authorized by: [name] MD   Signed by: [name] MD on 05/29/2018   Method used: Electronically to      [id number]     [address]     [place], [place]  [address]     Ph: [phone] or [phone]     Fax: [phone]   Note to Pharmacy: Route: ORAL;    RxID: [id number]        Medications:  Added new medication of COLCHICINE 0.6 MG ORAL CAPSULE (COLCHICINE) 2 tabs by mouth at onset of gout, then 1 an hour later; Route: ORAL - Signed  Rx of COLCHICINE 0.6 MG ORAL CAPSULE (COLCHICINE) 2 tabs by mouth at onset of gout, then 1 an hour later; Route: ORAL  #9[Capsule] x 3;  Signed;  Entered by: [name] MD;  Authorized by: [name] MD;  Method used: [id number] [address] [place], [place]  [address], Ph: [phone] or [phone], Fax: [phone]; Note to Pharmacy: Route: ORAL;

## 2018-06-27 ENCOUNTER — Ambulatory Visit

## 2018-07-09 ENCOUNTER — Ambulatory Visit

## 2018-07-09 NOTE — ED Provider Notes (Signed)
 [facility] Encounter      Imported By: Julious Oka [place] 07/10/2018 11:47:59 AM    _____________________________________________________________________    External Attachment:      Type: Image      Comment: External Document

## 2018-07-10 ENCOUNTER — Ambulatory Visit

## 2018-07-11 ENCOUNTER — Ambulatory Visit

## 2018-07-11 ENCOUNTER — Ambulatory Visit: Admitting: Family

## 2018-07-11 LAB — HX  COMPLETE BLOOD COUNT
HX HEMATOCRIT: 44.1 % (ref 39.0–53.0)
HX HEMOGLOBIN: 14.9 g/dL (ref 13.0–17.5)
HX MEAN CORP.HEMO.CONC.: 33.8 g/dL (ref 31.0–37.0)
HX MEAN CORPUSCULAR HEMOGLOBIN: 31.8 pg (ref 26.0–34.0)
HX MEAN CORPUSCULAR VOLUME: 94 fL (ref 80.0–100.0)
HX MEAN PLATELET VOLUME: 12.1 fL (ref 9.4–12.4)
HX NUCLEATED RBC %: 0 % (ref 0.0–0.0)
HX PLATELET COUNT: 213 10*3/uL (ref 150.0–400.0)
HX RED BLOOD COUNT: 4.69 10*6/uL (ref 4.2–5.9)
HX RED CELL DISTRIBUTION WIDTH SD: 45.4 fL (ref 35.0–51.0)
HX WHITE BLOOD COUNT: 5.8 10*3/uL (ref 4.0–11.0)

## 2018-07-11 NOTE — Progress Notes (Signed)
 Visit Type:  Annual Physical  Referring Provider:  [name]  Primary Provider:  [name] MD      History of Present Illness:  Timothy Morris is a 48 Years Old Male who presents today for: CPE    #1 Patient in today for follow up on blood pressure. Denies any c/p, SOB, Headaches or dizziness.  Denies any side effects of medications.  Patient last had eye exam  2 months ago   Feels bp normal at home but may be elevated today due to lower back pain.   #2 Lower back pain saw dr. [name] yesterday at [facility] who ordered PT for him.    Will be doing PT to see if helpfull  Had xray  X-Ray Evaluation:   L.S. Spine 2 views  Evaluation: 2 view x-rays of the lumbar spine show mild L3 on 4 retrolisthesis with multilevel degenerative disc disease and anterior osteophytes from L1-L4  Assessment: Multilevel lumbar degenerative changes  Pre-visit  discussed with other staff members to plan and provide appropriate testing  and management for patient.        Specialists seen since last visit? Ortho-Dr [name]      Flu Injection declined        Current Medications- Reviewed during today's visit  VIAGRA 100 MG ORAL TABLET (SILDENAFIL CITRATE): 1/2 - 1 by mouth daily as needed 1 hour prior to intercourse  AMLODIPINE BESY-BENAZEPRIL HCL 10-40 MG ORAL CAPSULE: Daily for blood pressure  ALLOPURINOL 300 MG ORAL TABLET: take one by mouth daily  HYDROCHLOROTHIAZIDE 12.5 MG ORAL TABLET: take one by mouth once daily in AM for blood pressure  COLCHICINE 0.6 MG ORAL CAPSULE: 2 tabs by mouth at onset of gout, then 1 an hour later      Past Medical History  Hypertension  Gout   ED    Surgical History  Right Arthroscopy x 2   Right Rottor Cuff  RIH  Colectomy Sigmoid Dr [name]   Pilonodal Cyst    Family History  Father Healthy  Estranged  Mother Breast Cancer  RN at [employer]  Siblings  Sisters Healthy  2018    Social History  Marital Status: Divorced.  Children:  2    Lives With: [facility] [place]   Occupation:  [employer] and son       Risk Factors  Smoking  Status: never smoked  Passive smoke exposure: No    Drug use: no  Alcohol use: no  HIV high risk behavior: no    Exercise: Yes  Exercise Comments:  job aerobic     Caffeine (drinks/day): 1  Sun exposure: rarely  Seatbelt use (%): 100  Family History MI in male age < 12: no  Family History MI in male age < 37: no  Last Cholesterol:   224 (06/20/2017 8:15:15 AM)  Last HDL:   35 (06/20/2017 8:15:15 AM)  Last LDL:   135 (06/20/2017 8:15:15 AM)        Review of Systems   General: Denies fever, chills, sweats, anorexia, fatigue, weakness, malaise, weight loss. eye exam 2 months ago  Dentist 2 months  Eyes: Denies visual change or blurring, eye pain.   Ears/Nose/Throat: Denies earache, decreased hearing, difficulty swallowing.   Cardiovascular: Denies chest pain or pressure, palpitations, shortness of breath.   Respiratory: Denies dry cough, productive cough, shortness of breath, wheezing.   Gastrointestinal: Denies acid indigestion, nausea, vomiting, diarrhea, abdominal pain, change in bowel habits, constipation, mucous or blood in stools.   Genitourinary: Denies  dysuria, decreased urinary stream, nocturia, erectile dysfunction, testicular pain or masses.   Musculoskeletal: Complains of back pain. Denies muscle cramps or aches, muscle weakness, morning stiffness, joint pain, joint swelling.   Skin: Denies dry skin, rash, skin ulcers, suspicious lesions.   Neurologic: Denies memory loss, parasthesias, dizziness, headaches, transient weakness.   Psychiatric: Denies anxiety, depression, insomnia.   Endocrine: Denies skin changes, hair loss, weight gain, weight loss, cold intolerance, heat intolerance, polyuria, polydipsia, loss of libido.   Heme/Lymphatic: Denies easy bruising, fatigue, unusual bleeding, fevers, night sweats.     Vital Signs     Patient: 48 Years Old Male  Height:  70.5 in.  Weight: 227 lbs      Wt Chg: 17 since 01/12/2018  BMI:  32.23        29.81 on 01/12/2018  BP:  138/88 left arm, normal cuff, seated      121/77 on 01/12/2018     148/92  Pulse:  70         Pulse Ox: 97 %  On Oxygen: No    Patient is experiencing Pain  Location: back    Type: chronic     Medications and Allergies Reviewed    Signed: [name]....July 11, 2018 3:35 PM    PHQ 2    Over the last 2 weeks, how often have you been bothered by any of the following problems?  1. Little interest or pleasure in doing things:  0   - Not at all  2. Feeling down, depressed, or hopeless:  0   - Not at all          Physical Exam    General:      well developed obese , well nourished, in no acute distress.    Head:      normocephalic and atraumatic.    Eyes:      PERRL/EOM intact, conjunctiva and sclera clear with out nystagmus.    Ears:      TM's intact and clear with normal canals with grossly normal hearing.    Nose:      no deformity, discharge, inflammation, or lesions.    Mouth:      no deformity or lesions with good dentition.    Neck:      no masses, thyromegaly, or abnormal cervical nodes.    Chest Wall:      no deformities or breast masses noted.    Lungs:      clear bilaterally to auscultation.    Heart:      non-displaced PMI, chest non-tender; regular rate and rhythm, S1, S2 without murmurs, rubs, or gallops  Abdomen:       normal bowel sounds; no hepatosplenomegaly no ventral,umbilical hernias or masses noted.    Rectal:      normal exam.    Genitalia:      normal male, testes descended bilaterally without masses, no hernias, no varicoceles noted.    Msk:      Pain in bilat  paraspinal area with flexion  Straight leg raises negative for radiculopathy pain.    No pain with dorsi or plantar flexion of feet. Reflexes plus 1-2.  Able to walk on heels and toes without difficulty.  No swelling or ecchymosis.     Pulses:      pulses normal in all 4 extremities.    Extremities:      no clubbing, cyanosis, edema, or deformity noted with normal full range of motion of all joints.  Neurologic:      no focal deficits, cranial nerves II-XII grossly  intact with normal sensation, reflexes, coordination, muscle strength and tone.    Skin:      intact without lesions or rashes.    Cervical Nodes:      no significant adenopathy.    Axillary Nodes:      no significant adenopathy.    Inguinal Nodes:      no significant adenopathy.    Psych:      alert and cooperative; normal mood and affect; normal attention span and concentration.           Assessment and Plan:     1. PREVENTATIVE HEALTH CARE (Z00.00)    - Patient histories, medications,diagnoses, providers, as well as functionl status, mental status, HRA, educational needs and barriers reviewed.  Follow up in one year for annual wellness exam.   Patient will follow up for acute, ongoing and chronic issues as scheduled and needed.   Routine screening was reviewed per guidelines by USPSTF      2. OBESITY (BMI = 30-39.9) (E66.9)    Discussed with patient options to loose weight.  Recommended [place] diet/ 1600 calorie diet and  Exercise 150 minutes per week.   Offered referral to nutritionist.       3. HYPERTENSION (I10)    Bp goal < 140/90  blood pressure will be monitored and maintained at optimal levels per JNC 8 Guidelines. Meds reviewed for appropriateness.  Discussed with patient lifestyle modification, diet, exercise and to continue with current medications as prescribed. Dicussed with patient to take bp at home and record.       4. LOWER BACK PAIN (M54.5)    Will follow up with PT and orthopedic        Problems Reviewed  Patient/Caregiver understand instructions and plan.    Changes to Medication List Documented Today:   AMLODIPINE BESY-BENAZEPRIL HCL 10-40 MG ORAL CAPSULE (AMLODIPINE BESY-BENAZEPRIL HCL) Daily for blood pressure; Route: ORAL    Patient Instructions    Recommend a  Mediterranean diet.  Record bp outside office and bring readings to office.  Eat low fat diet.  Exercise at least 150 minutes per week.  Lose weight for better management of blood pressure.  Yearly eye exam  Optimal bp  readings should be < 140/90.  Follow up in 6 months.   Avoidance of salt    Dentist exam q 6 months  Eye exams  Use Seatbelt  Wear sunscreen  A healthy Body Mass Index is 25 or Less  Please be sure to register and correspond with Dr Nathaneil Canary through the [facility] Patient Portal at [email or web address]  Help Number [phone]   Call if symptoms worsen.  Take Motrin 800 mg three times daily with food.  Follow up in 2 weeks if not better.  May try otc Muscle Rubs

## 2018-07-12 ENCOUNTER — Ambulatory Visit: Admitting: Family

## 2018-07-12 ENCOUNTER — Ambulatory Visit

## 2018-07-12 LAB — HX CREATININE & EGFR
HX CREATININE: 1.36 mg/dL — ABNORMAL HIGH (ref 0.55–1.3)
HX GLOMERULAR FR AFRICAN AMERICAN: 70
HX GLOMERULAR FR NON AFRICAN AMER: 61

## 2018-07-12 LAB — HX ELECTROLYTES
HX ANION GAP: 5 (ref 3.0–11.0)
HX BICARBONATE: 29 mmol/L (ref 21.0–32.0)
HX CHLORIDE: 106 mmol/L (ref 98.0–110.0)
HX POTASSIUM: 4 mmol/L (ref 3.6–5.2)
HX SODIUM: 140 mmol/L (ref 136.0–146.0)

## 2018-07-12 LAB — HX BUN: HX BUN: 21 mg/dL — ABNORMAL HIGH (ref 6.0–20.0)

## 2018-07-12 LAB — HX PROSTATE SPECIFIC ANTIGEN SCR: HX PROSTATE SPECIFIC ANTIGEN SCR: 0.51 ng/mL (ref 0.0–4.0)

## 2018-07-12 NOTE — Telephone Encounter (Signed)
 Phone Note -       Initial call taken by: Thersa Salt NP,  July 12, 2018 7:44 AM  Initial Details of Call:  Please call and inform patient that blood work was normal.  Thanks .ts          Follow-up #1  Action: Phone call completed, Patient Notified  By: Julious Oka [place] ~ July 12, 2018 8:28 AM

## 2018-08-01 ENCOUNTER — Ambulatory Visit

## 2018-08-14 ENCOUNTER — Ambulatory Visit

## 2018-08-28 ENCOUNTER — Ambulatory Visit: Admitting: Orthopaedic Surgery

## 2018-08-28 ENCOUNTER — Ambulatory Visit

## 2018-09-03 ENCOUNTER — Ambulatory Visit

## 2018-09-11 ENCOUNTER — Ambulatory Visit

## 2018-09-17 ENCOUNTER — Ambulatory Visit

## 2018-09-27 ENCOUNTER — Ambulatory Visit

## 2018-09-27 NOTE — Progress Notes (Signed)
 Visit Type:  telehealth-acute  Referring Provider:  [name]  Primary Provider:  [name] MD      History of Present Illness:  Timothy Morris is a 48 Year Old Male who presents with sore throat for 1 week    This real-time, interactive virtual Telehealth encounter was done by:    phone  Two patient identifiers were used and confirmed.   The patient was at home  Greater than 50% of the time was spent devoted to counseling/ coordinating care.  The patient has consented to this encounter type.  Total minutes spent:      COVID screen:  COVID common symptoms:       Shortness of breath?   NO Fever ?  no Cough? (Dry?)  no  myalgia or fatigue?  no  Loss of sense of smell or taste ?     Date start of symptoms:  Symptoms:   Risks:  Contact with a confirmed or suspected case of COVID -19:       NO   Are you a healthcare provider? NO   Have you been in a healthcare facility in the past 14 days?  NO              Past Medical History  Hypertension  Gout   ED    Surgical History  Right Arthroscopy x 2   Right Rottor Cuff  RIH  Colectomy Sigmoid Dr [name]   Pilonodal Cyst    Family History  Father Healthy  Estranged  Mother Breast Cancer  RN at [employer]  Siblings  Sisters Healthy  2020     Social History  Marital Status: Divorced.  Children:  2    Lives With: [facility] [place]   Occupation:  [employer] and son       Risk Factors  Tobacco User: no  Smoking Status: never smoked  Passive smoke exposure: No    Drug use: no  Alcohol use: no  HIV high risk behavior: no    Exercise: Yes  Exercise Comments:  job aerobic     Caffeine (drinks/day): 1  Sun exposure: rarely  Seatbelt use (%): 100  Family History MI in male age < 3: no  Family History MI in male age < 88: no        Review of Systems   General: Denies fever, chills, sweats, anorexia, fatigue, weakness, malaise, weight loss.   Ears/Nose/Throat: Complains of sore throat. Denies earache, decreased hearing, difficulty swallowing.   Cardiovascular: Denies chest pain or pressure,  palpitations, shortness of breath.   Respiratory: Denies dry cough, productive cough, shortness of breath, wheezing.   Gastrointestinal: Denies acid indigestion, nausea, vomiting, diarrhea, abdominal pain, change in bowel habits, constipation, mucous or blood in stools.     Vital Signs     Patient: 48 Years Old Male  Height:  70.5 in.  Prev Weight:  227 lbs   BMI: 32.23 on 07/11/2018   Old BP:  138/88 on 07/11/2018    Comments: no fever           Physical Exam    General:       in no acute distress.          Impression and Recommendations:     ~SORE THROAT (ACUTE) (J02.9)  allergic or viral non Covid  reasured  will try OTC s and followup as needed         Orders:   Added new Service order of Patient Encounter ([id number]) -  Signed  Added new Service order of OV Est Level III ([id number]) - Signed    Patient Instructions    call if symptoms increase from today's       Wash your hands frequently.  Frequently wash your hands with an alcohol based hand rub or wash and /or with soap and water for 20 seconds.   Maintain social distancing.  6 feet distance between you and others.   Avoid touching your eyes nose and mouth.   Practice good respiratory hygiene  Cover your mouth and nose with your elbow or tissue when you cough or sneeze. Dispose of the use tissue immediately.   Stay home if you feel unwell. If you have a fever cough and difficulty breathing call

## 2018-10-11 ENCOUNTER — Ambulatory Visit

## 2018-11-19 ENCOUNTER — Ambulatory Visit

## 2018-11-26 ENCOUNTER — Ambulatory Visit

## 2018-11-28 ENCOUNTER — Ambulatory Visit

## 2018-11-29 ENCOUNTER — Ambulatory Visit

## 2018-11-29 ENCOUNTER — Ambulatory Visit: Admitting: Internal Medicine

## 2018-11-29 LAB — HX  COMPLETE BLOOD COUNT
HX HEMATOCRIT: 44 % (ref 39.0–53.0)
HX HEMOGLOBIN: 14.9 g/dL (ref 13.0–17.5)
HX MEAN CORP.HEMO.CONC.: 33.9 g/dL (ref 31.0–37.0)
HX MEAN CORPUSCULAR HEMOGLOBIN: 32.7 pg (ref 26.0–34.0)
HX MEAN CORPUSCULAR VOLUME: 96.5 fL (ref 80.0–100.0)
HX MEAN PLATELET VOLUME: 12.8 fL — ABNORMAL HIGH (ref 9.4–12.4)
HX NUCLEATED RBC %: 0 % (ref 0.0–0.0)
HX PLATELET COUNT: 179 10*3/uL (ref 150.0–400.0)
HX RED BLOOD COUNT: 4.56 10*6/uL (ref 4.2–5.9)
HX RED CELL DISTRIBUTION WIDTH SD: 48.2 fL (ref 35.0–51.0)
HX WHITE BLOOD COUNT: 6.4 10*3/uL (ref 4.0–11.0)

## 2018-11-29 LAB — HX TSH WITH REFLEX: HX TSH WITH REFLEX: 1.06 u[IU]/mL (ref 0.358–3.74)

## 2018-11-29 NOTE — Progress Notes (Signed)
 Visit Type:  Follow-up Visit  Referring Provider:  [name]  Primary Provider:  [name] MD      History of Present Illness:  Timothy Morris is a 48 Years Old Male who presents today for: Follow up  oat feels dry and mucous he    still has dry throat allergy meds for the last few weeks Also asking that his T levle be checked re fatigue decreased liido ED issues       Specialists seen since last visit? Orthopedic, PT chronc back pain    Smoking THC for Anxiety for months     prior to visit chart was reviewed and needed clinical screening, lab testing,  discussed with medical assistant            Past Medical History  Hypertension  Gout   ED    Surgical History  Right Arthroscopy x 2   Right Rottor Cuff  RIH  Colectomy Sigmoid Dr [name]   Pilonodal Cyst    Family History  Father Healthy  Estranged  Mother Breast Cancer  RN at [employer]  Siblings  Sisters Healthy  2020     Social History  Marital Status: Divorced.  Children:  2    Lives With: [facility] [place]   Occupation:  [employer] and son       Risk Factors  Tobacco User: no  Smoking Status: never smoked  Passive smoke exposure: No    Drug use: no  Alcohol use: no  HIV high risk behavior: no    Exercise: Yes  Exercise Comments:  job aerobic     Caffeine (drinks/day): 1  Sun exposure: rarely  Seatbelt use (%): 100  Family History MI in male age < 14: no  Family History MI in male age < 59: no        Review of Systems   General: Complains of fatigue, malaise. T Deficiency ?  Cardiovascular: Denies chest pain or pressure, palpitations, shortness of breath.   Respiratory: Denies dry cough, productive cough, shortness of breath, wheezing.   Genitourinary: Complains of urinary frequency, urinary hesitancy, erectile dysfunction.   Musculoskeletal: Denies muscle cramps or aches, muscle weakness, morning stiffness, joint pain, joint swelling.   Neurologic: Denies memory loss, parasthesias, dizziness, headaches, transient weakness.     Vital Signs     Patient: 48 Years Old  Male  Height:  70.5 in.  Weight: 231 lbs      Wt Chg: 4 since 07/11/2018  BMI:  32.79        32.23 on 07/11/2018  BP:  126/88 right arm, large cuff, seated     138/88 on 07/11/2018   Pulse:  65         Pulse Ox: 98 %  On Oxygen: No    Patient is experiencing Pain  Location: back    Type: chronic     Medications and Allergies Reviewed    Signed: [name]....November 29, 2018 2:00 PM    PHQ 2    Over the last 2 weeks, how often have you been bothered by any of the following problems?  1. Little interest or pleasure in doing things:  0   - Not at all  2. Feeling down, depressed, or hopeless:  0   - Not at all          Physical Exam    General:       in no acute distress.    Lungs:  clear bilaterally to auscultation.    Heart:      non-displaced PMI, chest non-tender; regular rate and rhythm, S1, S2 without murmurs, rubs, or gallops  Neurologic:      no focal deficits,        Impression and Recommendations:     ~HYPERTENSION (I10)  today's blood pressure was good and meets JNC 8 guidelines.  Patient is compliant with medications and lifestyle recommendations.  No change to current regimen made today.  Home blood pressure monitoring encouraged.       ~DECREASED LIBIDO (R68.82)    Medication(s): TADALAFIL 5 MG ORAL TABLET: Daily for BPH  Order(s): Testosterone; Total     ~OBESITY (BMI = 30-39.9) (E66.9)  Discussed making healthy lifestyle changes, weight loss,  increasing exercises, changing diet and  adherence to  these changes   discussed referral to  weight  management clinic-declined.       ~SCREENING, COLON CANCER (Z12.11)    Order(s): Gastroenterology Referral        Problems Reviewed  Med Compliance and SE's: Pt is compliant with meds with no side effects     Medications Removed Today:   HYDROCHLOROTHIAZIDE 12.5 MG ORAL TABLET (HYDROCHLOROTHIAZIDE) take one by mouth once daily in AM for blood pressure; Route: ORAL    Changes to Medication List Documented Today:   TADALAFIL 5 MG ORAL TABLET (TADALAFIL)  Daily for BPH; Route: ORAL  Orders:   Added new Service order of Patient Encounter ([id number]) - Signed  Added new Service order of OV Est Level IV ([id number]) - Signed  Added new Test order of LYTE -Electrolytes (LYTE) - Signed  Added new Test order of BS - Glucose** (BS) - Signed  Added new Test order of BUN - Blood Urea Nitrogen (BUN) - Signed  Added new Test order of CA - Calcium (CA) - Signed  Added new Test order of CBC -CBC Only (No Diff)** (CBCO) - Signed  Added new Test order of CRE -Creatinine, Blood (CRE) - Signed  Added new Test order of TSHR -TSHR with Reflex** [facility]) - Signed  Added new Test order of Testosterone, Total (TESTTOTAL) - Signed  Added new Referral order of Gastroenterology Referral (GI) - Signed    Patient Instructions    Stop Zyrtec  Stop Hydrochlorthiazide   Avoid Bladder Irritants   Smoking THC ?   Follow up in 3 months     Medications:  TADALAFIL 5 MG ORAL TABLET (TADALAFIL) Daily for BPH  #30[Tablet] x 3   Route:ORAL   Entered and Authorized by: [name] MD   Signed by: [name] MD on 11/29/2018   Method used: Electronically to      [id number] (retail)     [address]     [place], [place]  [address]     Ph: [phone] or [phone]     Fax: [phone]   Note to Pharmacy: Route: ORAL;    RxID: [id number]  Cancelled HYDROCHLOROTHIAZIDE 12.5 MG ORAL TABLET (HYDROCHLOROTHIAZIDE) take one by mouth once daily in AM for blood pressure  #90[Tablet] x 1   Route:ORAL   Entered and Authorized by: [name] MD   Signed by: [name] MD on 11/29/2018   Method used: Electronically to      [id number] (retail)     [address]     [place], [place]  [address]     Ph: [phone] or [phone]     Fax: [phone]   RxID: [id number]

## 2018-11-30 ENCOUNTER — Ambulatory Visit

## 2018-11-30 ENCOUNTER — Ambulatory Visit: Admitting: Internal Medicine

## 2018-11-30 LAB — HX CREATININE & EGFR
HX CREATININE: 1.15 mg/dL (ref 0.4–1.3)
HX GLOMERULAR FR AFRICAN AMERICAN: 81
HX GLOMERULAR FR NON AFRICAN AMER: 67

## 2018-11-30 LAB — HX ELECTROLYTES
HX ANION GAP: 6 mmol/L (ref 3.0–11.0)
HX BICARBONATE: 27 mmol/L (ref 21.0–32.0)
HX CHLORIDE: 103 mmol/L (ref 98.0–110.0)
HX POTASSIUM: 3.9 mmol/L (ref 3.6–5.2)
HX SODIUM: 136 mmol/L (ref 136.0–146.0)

## 2018-11-30 LAB — HX CALCIUM: HX CALCIUM: 9.6 mg/dL (ref 8.5–10.5)

## 2018-11-30 LAB — HX GLUCOSE: HX GLUCOSE: 83 mg/dL (ref 70.0–110.0)

## 2018-11-30 LAB — HX BUN: HX BUN: 19 mg/dL (ref 7.0–23.0)

## 2018-11-30 NOTE — Progress Notes (Signed)
 [facility] [place]   [address]   [place], [place] [address]  Office: [phone] Fax: [phone]  November 30, 2018        Timothy Morris  [address]  [place], [place]  [address]    Dear Mr.  Rudden:     Enclosed is important information regarding your scheduled procedure.  Dr. Hessie Diener has reviewed all of your medical information available and has reserved a time for your specified appointment.  Please note that this appointment requires a time slot at the endoscopy suite, booking of nursing staff, and (depending on the type of procedure) an anesthesiologist.  Cancelling your appointment on short notice, or not showing up for the appointment, leads to unnecessary downtime and cost and results in decreased access to timely care for our patients.  Try to avoid the cancellation of your appointment, if at all possible, by making the necessary arrangements needed in order to keep your appointment.  If you have to cancel ? Please call at least 5 business days ahead of time, to allow for another patient to be schedule in that time slot.  If you cancel on shorter notice, or do not show for your appointment, you may be billed a nominal administrative fee of $150.00 to help cover some of the incurred overhead costs.  We appreciate your cooperation and understanding in this matter.  Sincerely,  [facility]

## 2018-11-30 NOTE — Telephone Encounter (Signed)
 Phone Note -       Initial call taken by: Eddie Dibbles,  November 30, 2018 10:51 AM  Initial Details of Call:  pt cld to book screening colo with Dr. Claudius Sis scheduled 7/15 at 8am/7am arrival [facility], pt aware will need covid testing 3 days prior, FF to put order in 2 weeks before. Pls advise    PM - NO  DM - NO  BT - NO    SCHEDULED IN ENDO, JT        Follow-up #1  Details: Patient's medical records (problem list, medications, etc), recent laboratory tests, pertinent hospital/procedure documents reviewed.  Proceed with procedure as scheduled.   By: Hessie Diener MD ~ November 30, 2018 12:20 PM    Details: mailed prep instructions to patient  By: Eddie Dibbles ~ November 30, 2018 1:00 PM

## 2018-11-30 NOTE — Progress Notes (Signed)
 [facility] [place]   [address]  [place], [place] [address]  Phone: [phone]  Fax: [phone]     November 30, 2018      Timothy Morris  [address]  [place], [place] [address]      Dear Mr. [place]:    Your appointment has been made for you. Please see details below.  Please bring a photo ID and your insurance card to your appointment.      Appointment Type: Colonoscopy  Facility/Provider Phone Number: [phone] x [phone]  Date: 12/26/2018   Day of Week: Wednesday  Time: 8:00 AM  Arrival Time: 7:00 AM      Appointment Details:   Dr. Hessie Diener  [facility]  [address]  [place], [place] [address]  4th Floor Endo Unit    ***Our Insurance Coordination Department will be reaching out to you 3-5 days prior to your procedure to pre-register you for your appointment. During this phone call your demographic and insurance information will be confirmed and/or updated.  At this time they will also make you aware of any financial responsibilities you may have, such as co-payment, co-insurance or deductible.     Payments can be made over the phone at the time of preregistration.  Otherwise, payment is expected at check in on the day of your procedure.     If you have not heard from a member of the Insurance Coordination Department within 48 hours of your procedure please contact them at [phone].    You may also reach out to your insurance company with any questions regarding your financial responsibility      Patient Instructions for Miralax Colonoscopy Preparation:    PURCHASE THESE OVER THE COUNTER LAXATIVES:  1. GATORADE (96 ounces): lemonade, orange, clear, blue, lemon-lime (purchase three 32 OZ bottles)  2. DULCOLAX 5mg  tablets (two tablets)  3. MIRALAX BOTTLE 238 grams     IF YOU ARE TAKING BLOOD THINNERS (PLAVIX, XARELTO, OR COUMADIN), DIABETES MEDICATIONS (INSULIN OR TABLETS), HEART MEDICATIONS (DIGOXIN), OR IF YOU HAVE A PACEMAKER OR DEFIBRILLATOR (AICD):  PLEASE NOTIFY  us WELL IN ADVANCE OF THE PROCEDURE TO RECEIVE SPECIFIC INSTRUCTIONS.    THE DAY BEFORE YOUR COLONOSCOPY: CLEAR LIQUIDS ONLY. ABSOLUTLEY NO SOLID FOOD    Drink only clear liquids.  Examples of clear liquids:  Water, clear fruit juices such as apple or white grape, chicken or beef bouillon, Jell-O (NO RED, GREEN, OR GRAPE), Gatorade, or similar sports drink (NO RED, GREEN OR PURPLE), popsicles (NO RED, GREEN, OR GRAPE), clear soft drinks (Sprite, ginger ale, 7UP etc.), coffee/tea  without cream.     NO MILK OR MILK PRODUCTS, NO ORANGE JUICE, NO RED, GREEN, OR GRAPE JELLO OR JUICES    3 PM:  Take 2 Dulcolax tablets   5PM-10PM:  Pour the  entire bottle of Miralax in a large pitcher filled with the three bottles of Gatorade. Mix well to dissolve all of the Miralax.  Pour the solution back into the three bottles. Begin drinking the first two bottles of the solution. You may drink it cold or room temperature, whichever you prefer. If you need a break, that's okay. You can stop for 30 minutes and resume.   MIDNIGHT:  ***Very Important-Please read carefully***  After you have completed the two 32 OZ bottles of Gatorade, you must finish the remaining bottle by 3AM. Split dosing preparations are especially important because they help ensure the best clean out over conventional preps.  Consuming the second dose later allows for clean out of residual stool because your body continues to produce stool.**    THE DAY OF YOUR COLONOSCOPY:  FOR AM PROCEDURES:  You may take your morning medications with a sip of water.     FOR AFTERNOON PROCEDURES:  You may drink clear liquids until 6AM.  NO SOLID FOOD.  You may take your morning medications with a sip of water.     **If you typically have caffeine, and are prone to caffeine headaches, you may have a  small cup of coffee or tea  WITHOUT milk or cream in the morning.   You should have this at least four hours before your arrival time.  **    REMEMBER:  The preparation is very  important.  An adequate clean out allows for the best evaluation of your entire colon.  During the prep, using baby wipes may ease some of your discomfort (if any). Applying Vaseline or similar to the anal area can also help with any discomfort.    *If you become ill with cold or flu symptoms, you should call the office immediately and speak to the nurse.*    All patients must make arrangements to be picked up and accompanied home by a friend or relative at time of discharge.  You will not be allowed to go home by yourself, including public transportation, or taxi.  You should NOT plan on working or driving the rest of the day due to the sedation given at the procedure.     You will be able to return home approximately 3 hours from your arrival time.         Sincerely,    Hessie Diener, M.D.

## 2018-12-02 ENCOUNTER — Ambulatory Visit: Admitting: Internal Medicine

## 2018-12-02 LAB — HX TESTOSTERONE, TOTAL, MS: HX TESTOSTERONE, TOTAL, MS: 381 ng/dL (ref 250.0–1100.0)

## 2018-12-02 NOTE — Telephone Encounter (Signed)
 Phone Note -       Initial call taken by: [name] MD,  December 02, 2018 3:33 PM  Initial Details of Call:  call and let this patient know all of the recent blood tests were normal        Follow-up #1  Action: Phone call completed, Patient Notified  By: Fonnie Jarvis [place] ~ December 03, 2018 8:16 AM

## 2018-12-04 LAB — HX TESTOSTERONE, TOTAL, MS: HX TESTOSTERONE, TOTAL, MS: 392 ng/dL (ref 250.0–1100.0)

## 2018-12-10 ENCOUNTER — Ambulatory Visit

## 2018-12-11 ENCOUNTER — Ambulatory Visit

## 2018-12-11 NOTE — Telephone Encounter (Signed)
 Phone Note -     Outgoing Call    Initial call taken by: Eugenia Mcalpine,  December 11, 2018 1:27 PM  Summary of Call: SWP, confirmed appt date, time/arrival, transportation, prep instructions  pt will be scheduled for covid 19 test.

## 2018-12-11 NOTE — Telephone Encounter (Signed)
 Phone Note -       Initial call taken by: Eugenia Mcalpine,  December 11, 2018 1:32 PM              Orders:  Added new Test order of Covid 19 [place] (COVID19TUFTS) - Signed

## 2018-12-12 ENCOUNTER — Ambulatory Visit

## 2018-12-12 NOTE — Telephone Encounter (Signed)
----   Converted from flag ----  ---- 12/12/2018 8:53 AM, Romilda Garret wrote:  BOOKED 12/23/18 9:20AM    ---- 12/11/2018 1:37 PM, Eugenia Mcalpine wrote:  COVID-19 Drive Up Test Order  Authorized by:  Hessie Diener MD  Ordered by:  Eugenia Mcalpine  Comments: Dr Hessie Diener, colo, dos  12/26/18             Covid test date 12/23/18    Patient: Timothy Morris  DOB: 01/01/1971  ID: [id number]  ------------------------------

## 2018-12-17 ENCOUNTER — Ambulatory Visit

## 2018-12-23 ENCOUNTER — Ambulatory Visit

## 2018-12-24 ENCOUNTER — Ambulatory Visit

## 2018-12-24 NOTE — Telephone Encounter (Signed)
 Phone Note -       Initial call taken by: Darden Palmer,  December 24, 2018 11:27 AM  Actual Caller: Patient  Initial Details of Call:  pt called and cxled his colonosco[py on 7/15 because he has to go out of town, did not go for [employer] testing. Declined to rebook at this time, will call back when he returns, Taken out of pM and endo. jh

## 2018-12-31 ENCOUNTER — Ambulatory Visit

## 2019-01-25 ENCOUNTER — Ambulatory Visit

## 2019-02-10 ENCOUNTER — Ambulatory Visit

## 2019-02-10 NOTE — ED Provider Notes (Signed)
 [facility]-Emergency Report (ED to Hosp-Admission)      Imported By: Julious Oka [place] 02/21/2019 10:52:52 AM    _____________________________________________________________________    External Attachment:      Type: Image      Comment: External Document

## 2019-02-11 ENCOUNTER — Ambulatory Visit

## 2019-02-11 LAB — UNMAPPED LAB RESULTS

## 2019-02-11 LAB — TRIGLYCERIDES (EXT): Triglycerides (EXT): 229 mg/dL — ABNORMAL HIGH (ref <150)

## 2019-02-12 LAB — UNMAPPED LAB RESULTS

## 2019-02-14 ENCOUNTER — Ambulatory Visit

## 2019-02-14 NOTE — Discharge Summary (Signed)
 Discharge Summary- [facility]      Imported By: Fonnie Jarvis [place] 03/02/2019 8:16:53 AM    _____________________________________________________________________    External Attachment:      Type: Image      Comment: External Document

## 2019-02-14 NOTE — Discharge Summary (Signed)
 [facility]-Discharge Summary      Imported By: Julious Oka [place] 03/06/2019 9:14:19 AM    _____________________________________________________________________    External Attachment:      Type: Image      Comment: External Document

## 2019-02-15 ENCOUNTER — Ambulatory Visit

## 2019-02-15 NOTE — Telephone Encounter (Signed)
 Phone Note -     Outgoing Call    Follow up Call:: Post Hosp  Initial call taken by: Talbert Cage [place],  February 15, 2019 10:16 AM  Call placed to: Patient  Summary of Call: [facility] d/c on 02/14/19.  Patient's stomach is still upset.  BP is still elevated. Appt. scheduled for 02/19/19.

## 2019-02-19 ENCOUNTER — Ambulatory Visit

## 2019-02-19 NOTE — Progress Notes (Signed)
spoke with Arline Asp at Dr. Mellody Life office; set pt up for a telehealth appointment first for 02/28/19 @ 4:00pm. Telehealth first, and if he needs further testing, they will make another appointment for him ..................................................................Marland KitchenJulious Oka [place]  February 19, 2019 2:50 PM      Observations:  Added new observation of ENBSRVTYPENC: Quick Note (02/19/2019 14:48)

## 2019-02-19 NOTE — Progress Notes (Signed)
Visit Type:  Hospital Discharge Follow up  Referring Provider:  Ameri  Primary Provider:  [name] MD      History of Present Illness:  Timothy Morris is a 48 Years Old Male who presents today for: Hospital Discharge Follow up  he was [facility] to 02/14/2019 with pancreatitis work up did not determine cause   in the course of the work up melena was noted but ultilately it attributed to peptp Bismol and false +; after colonoscopy and endoscopy were found to be normal   Still anot back to work but due to [employer] as aoopesed to this illness    Now feeling back to 100%     Specialists seen since last visit? Orthopedic, Colonoscopy\ and endoscopy  @ [facility] due to constipation and co black stool       Flu Injection declined        Past Medical History  Hypertension  Gout   ED    Surgical History  Right Arthroscopy x 2   Right Rottor Cuff  RIH  Colectomy Sigmoid Dr [name]   Pilonodal Cyst    Family History  Father Healthy  Estranged  Mother Breast Cancer  RN at [employer]  Siblings  Sisters Healthy  2020     Social History  Marital Status: Divorced.  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   Ears/Nose/Throat: has a funny taste in his mouth and strnage sensation in the back of his throat that despite many noral exams and reassurances he still worries is cancer   Would like to see ENT   Cardiovascular: Denies chest pain or pressure, palpitations, shortness of breath.   Respiratory:  Denies dry cough, productive cough, shortness of breath, wheezing.   Gastrointestinal: Denies acid indigestion, nausea, vomiting, diarrhea, abdominal pain, change in bowel habits, constipation, mucous or blood in stools.     Vital Signs     Patient: 48 Years Old Male  Height:  70.5 in.  Weight: 231 lbs        BMI:  32.79        32.79 on 11/29/2018  BP:  123/77 left arm, large cuff, seated     126/88 on 11/29/2018   Pulse:  72         Pulse Ox: 96 %  On Oxygen: No    Patient is not experiencing pain    Comments: Taking a med from [place] but name unknown  Medications and Allergies Reviewed    Signed: [name].Marland KitchenMarland KitchenMarland KitchenSeptember  8, 2020 2:00 PM    PHQ 2    Over the last 2 weeks, how often have you been bothered by any of the following problems?  1. Little interest or pleasure in doing things:  0   - Not at all  2. Feeling down, depressed, or hopeless:  0   - Not at all          Physical Exam    General:      well developed, well nourished, in no acute distress.    Mouth:      no deformity or lesions with good dentition.    Neck:      no masses, thyromegaly, or abnormal cervical nodes.    Lungs:      clear bilaterally to auscultation.    Heart:      non-displaced PMI, chest non-tender; regular rate and rhythm, S1, S2 without murmurs, rubs, or gallops  Abdomen:       normal bowel sounds; no hepatosplenomegaly no ventral,umbilical hernias or masses noted.    Neurologic:      no focal deficits,  Psych:      alert and cooperative; normal mood and affect; normal attention span and concentration.          Impression and Recommendations:     ~PANCREATITIS (K85.90)  resolved etiology undetermined   noted more on imaging then lab derangments   now completely resolved   is stiicking tol ow fat diet and abstaning from etoh at least for the next 6 months       ~THROAT IRRITATION (J39.2)  ?  Order(s): Otolaryngology Referral          Medications Removed Today:   ALLOPURINOL 300 MG ORAL TABLET (ALLOPURINOL) take one by mouth  daily; Route: ORAL  COLCHICINE 0.6 MG ORAL CAPSULE (COLCHICINE) 2 tabs by mouth at onset of gout, then 1 an hour later; Route: ORAL  TADALAFIL 5 MG ORAL TABLET (TADALAFIL) Daily for BPH; Route: ORAL  Orders:   Added new Service order of Patient Encounter ([id number]) - Signed  Added new Referral order of Otolaryngology Referral (OTOL) - Signed  Added new Service order of Transitional Care Manage Service 7 Day Discharge [phone]) [phone]) - Signed    Patient Instructions    follow up in 3 months to repeat labs     Medications:  AMLODIPINE BESY-BENAZEPRIL HCL 10-40 MG ORAL CAPSULE (AMLODIPINE BESY-BENAZEPRIL HCL) Daily for blood pressure  #90[Capsule] x 3   Route:ORAL   Entered and Authorized by: [name] MD   Signed by: [name] MD on 02/19/2019   Method used: Electronically to      [id number] (retail)     [address]     [place], [place]  [address]     Ph: [phone] or [phone]     Fax: [phone]   Note to Pharmacy: Route: ORAL;    RxID: [id number]  Cancelled ALLOPURINOL 300 MG ORAL TABLET (ALLOPURINOL) take one by mouth daily  #90[Tablet] x 1   Route:ORAL   Entered by: [name]   Authorized by: Thersa Salt NP   Signed by: [name] MD on 02/19/2019   Method used: Electronically to      [facility] (retail)     [address]     [place], [place]  [address]     Ph: [phone] or [phone]     Fax: [phone]   RxID: [id number]  Cancelled COLCHICINE 0.6 MG ORAL CAPSULE (COLCHICINE) 2 tabs by mouth at onset of gout, then 1 an hour later  #9[Capsule] x 3   Route:ORAL   Entered by: [name]   Authorized by: [name] MD   Signed by: [name] MD on 02/19/2019   Method used: Electronically to  [facility] ([facility]ail[facility]     [address]     [place], [place]  [address]     Ph: [phone] or [phone]     Fax: [phone]   RxID: [id number]  Cancelled TADALAFIL 5 MG ORAL TABLET (TADALAFIL) Daily for BPH  #30[Tablet] x  3   Route:ORAL   Entered by: [name]   Authorized by: [name] MD   Signed by: [name] MD on 02/19/2019   Method used: Electronically to      [facility] ([facility]ail[facility]     [address]     [place], [place]  [address]     Ph: [phone] or [phone]     Fax: [phone]   RxID: [id number]          Transitional Care Management  Discharge Physician: Hospitalist  Discharge Date: 02/14/2019  Reviewed: 02/15/2019  Diagnosis on discharge: PANCREATITIS (ICD-577.0) (EXH37-J69.67)  Date of Interactive contact (2 business days post D/C) 02/15/2019  Phone  Date of 7-day or 14-day face-to-face visit: 02/19/2019  Family and/or caretaker present at visit: No  Medications on Discharge: No  Medication Changes: None  Diagnostic tests reviewed/disposition: Yes  Disease/illness education: Yes  Home health/community services discussion/referrals: No  Establishment or re-establishment of referral orders for community resources: No  Discussion with other health care providers: No  Assessment and support of treatment regimen adherence: Yes  Education for self-management, independent living, and activities of daily living: Yes

## 2019-02-28 ENCOUNTER — Ambulatory Visit

## 2019-03-21 ENCOUNTER — Ambulatory Visit

## 2019-05-22 ENCOUNTER — Ambulatory Visit

## 2019-05-22 ENCOUNTER — Ambulatory Visit: Admitting: Internal Medicine

## 2019-05-22 LAB — HX BASIC METABOLIC PANEL
HX ANION GAP: 8 (ref 3.0–11.0)
HX BICARBONATE: 25 mmol/L (ref 21.0–32.0)
HX BUN: 19 mg/dL (ref 6.0–20.0)
HX CALCIUM: 9.4 mg/dL (ref 8.5–10.5)
HX CHLORIDE: 107 mmol/L (ref 98.0–110.0)
HX CREATININE: 1.04 mg/dL (ref 0.55–1.3)
HX GLOMERULAR FR AFRICAN AMERICAN: >90
HX GLOMERULAR FR NON AFRICAN AMER: 83
HX GLUCOSE: 88 mg/dL (ref 70.0–110.0)
HX POTASSIUM: 4.2 mmol/L (ref 3.6–5.2)
HX SODIUM: 140 mmol/L (ref 136.0–146.0)

## 2019-05-22 LAB — HX  COMPLETE BLOOD COUNT
HX HEMATOCRIT: 44.3 % (ref 39.0–53.0)
HX HEMOGLOBIN: 15.3 g/dL (ref 13.0–17.5)
HX MEAN CORP.HEMO.CONC.: 34.5 g/dL (ref 31.0–37.0)
HX MEAN CORPUSCULAR HEMOGLOBIN: 32.4 pg (ref 26.0–34.0)
HX MEAN CORPUSCULAR VOLUME: 93.9 fL (ref 80.0–100.0)
HX MEAN PLATELET VOLUME: 12.9 fL — ABNORMAL HIGH (ref 9.4–12.4)
HX NUCLEATED RBC %: 0 % (ref 0.0–0.0)
HX PLATELET COUNT: 206 10*3/uL (ref 150.0–400.0)
HX RED BLOOD COUNT: 4.72 10*6/uL (ref 4.2–5.9)
HX RED CELL DISTRIBUTION WIDTH SD: 44.9 fL (ref 35.0–51.0)
HX WHITE BLOOD COUNT: 5.7 10*3/uL (ref 4.0–11.0)

## 2019-05-22 LAB — HX C REACTIVE PROTEIN (QUANT): HX C REACTIVE PROTEIN (QUANT): 1.18 mg/dL — ABNORMAL HIGH (ref 0.0–0.8)

## 2019-05-22 LAB — HX LIVER FUNCTION PANEL
HX ALBUMIN: 4.2 g/dL (ref 3.2–5.0)
HX ALKALINE PHOSPHATASE: 72 U/L (ref 30.0–117.0)
HX ALT: 70 U/L — ABNORMAL HIGH (ref 6.0–55.0)
HX AST: 34 U/L (ref 6.0–40.0)
HX DIRECT BILIRUBIN: <0.1 (ref 0.0–0.3)
HX TOTAL BILIRUBIN: 0.4 mg/dL (ref 0.2–1.2)
HX TOTAL PROTEIN: 7.8 g/dL (ref 6.0–8.4)

## 2019-05-22 LAB — HX HEPATITIS C VIRAL ANTIBODY: HX HEPATITIS C VIRAL ANTIBODY: NONREACTIVE

## 2019-05-22 LAB — HX AMYLASE: HX AMYLASE: 93 U/L (ref 23.0–115.0)

## 2019-05-22 LAB — HX URIC ACID (SERUM): HX URIC ACID (SERUM): 7.3 mg/dL — ABNORMAL HIGH (ref 2.6–7.0)

## 2019-05-22 NOTE — Progress Notes (Signed)
Visit Type:  Follow-up Visit  Referring Provider:  [name]  Primary Provider:  [name] MD      History of Present Illness:  Timothy Morris is a 48 Years Old Male who presents today for: Follow up ;  he was admitted to [facility] 02/10/2019 to 02/14/2019 with pancreatitis; the work up did not determine cause of melena that was described at [facility]  He has had no further GI sx since then      A colonoscopy and endoscopy were found to be normal although a polyp was reportedly removed but the pathology note known  He wil track that down    Specialists seen since last visit? ENT-telehealth /Thrush placed on nystatin whch maybe helped a bit ; but stil having pain burning in the back of his throat   coffee seems to especially aggravate it;  he is seeing a dentist next week  for follow up and he will have them look as well  Agility Seeing Kapasi for facet injection lumbar radiculoapathy;     prior to visit chart was reviewed and needed clinical screening, lab testing,  discussed with medical assistant'        Current Medications- Reviewed during today's visit  VIAGRA 100 MG ORAL TABLET (SILDENAFIL CITRATE): 1/2 - 1 by mouth daily as needed 1 hour prior to intercourse  AMLODIPINE BESY-BENAZEPRIL HCL 10-40 MG ORAL CAPSULE: Daily for blood pressure      Past Medical History  Hypertension  Gout   ED    Surgical History  Right Arthroscopy x 2   Right Rottor Cuff  RIH  Colectomy Sigmoid Dr [name]   Pilonodal Cyst    Family History  Father Healthy  Estranged  Mother Breast Cancer  RN at [employer]  Siblings  Sisters Healthy  2020     Social History  Marital Status: Divorced.  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   Cardiovascular: Denies chest pain or pressure, palpitations, shortness of breath.   Respiratory: Denies dry cough, productive cough, shortness of breath, wheezing.   Gastrointestinal: Denies acid indigestion, nausea, vomiting, diarrhea, abdominal pain, change in bowel habits, constipation, mucous or blood in stools.     Vital Signs     Patient: 48 Years Old Male  Height:  70.5 in.  Weight: 229 lbs      Wt Chg: -2 since 02/19/2019  BMI:  32.51        32.79 on 02/19/2019  BP:  137/71 left arm, large cuff, seated     123/77 on 02/19/2019   Pulse:  65         Pulse Ox: 98 %  On Oxygen: No    Patient is experiencing Pain  Location: back    Type: chronic     Medications and Allergies  Reviewed    Signed: [name].Marland KitchenMarland KitchenMarland KitchenDecember  9, 2020 11:10 AM    PHQ 2    Over the last 2 weeks, how often have you been bothered by any of the following problems?  1. Little interest or pleasure in doing things:  0   - Not at all  2. Feeling down, depressed, or hopeless:  0   - Not at all          Physical Exam    General:      well developed, well nourished, in no acute distress.    Mouth:      tongue normal no deformity or lesions with good dentition.    Lungs:      clear bilaterally to auscultation.    Heart:      regular rhythm, normal rate and no murmurs.    Abdomen:       normal bowel sounds; no hepatosplenomegaly no ventral,umbilical hernias or masses noted.          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 per labs      ~GOUT (M10.9)  Improved. low purine diet   Order(s): URIC - Uric Acid     ~HYPERTENSION (I10)   today's blood pressure was good and meets JNC 8 guidelines.  Patient is compliant with medications and lifestyle recommendations.  No change to current regimen made today.  Home blood pressure monitoring encouraged.          Orders:   Added new Service order of Patient Encounter ([id number]) - Signed  Added new Service order of OV Est Level IV ([id number]) - Signed  Added new Service order of Venipuncture ([id number]) - Signed  Added new Test order of BMP - Basic Metabolic Panel (BMP) - Signed  Added new Test order of LIVER - Liver Function Panel (LIVER) - Signed  Added new Test order of CBC -CBC Only (No Diff)** (CBCO) - Signed  Added new Test order of AMY - Amylase (AMY) - Signed  Added new Test order of HEP C Antibody (HEPC) - Signed  Added new Test order of URIC - Uric Acid (URIC) - Signed  Added new Test order of CRP - C Reactive Protein (CRP) - Signed    Patient Instructions    call if symptoms increase from today's   follow up blood pressure check in 6 months

## 2019-05-23 ENCOUNTER — Ambulatory Visit

## 2019-05-23 ENCOUNTER — Ambulatory Visit: Admitting: Internal Medicine

## 2019-05-23 NOTE — Telephone Encounter (Signed)
 Phone Note -       Initial call taken by: [name] MD,  May 23, 2019 7:25 AM  Initial Details of Call:  call and let this patient know all of the recent blood tests were normal        Follow-up #1  Action: Phone call completed, Patient Notified  By: Fonnie Jarvis [place] ~ May 23, 2019 8:29 AM

## 2019-05-23 NOTE — Progress Notes (Signed)
 Colonoscopy done @ [facility]-Repeat in 1 year

## 2019-09-18 ENCOUNTER — Ambulatory Visit

## 2019-09-18 NOTE — Telephone Encounter (Signed)
 Phone Note -       Call back at Cell [phone]  Initial call taken by: Julious Oka [place],  September 18, 2019 1:40 PM  Initial Details of Call:  pt states that his daughter just tested positive today, and he is scheduled to have his covid vaccine tomorrow. What should he do? Please advise.      Follow-up #1  Details: wear mask get vaccine  By: [name] MD ~ September 18, 2019 4:11 PM    Action: Phone call completed, Patient Notified  By: Julious Oka [place] ~ September 18, 2019 4:13 PM

## 2019-09-19 ENCOUNTER — Ambulatory Visit: Admitting: Internal Medicine

## 2019-09-19 ENCOUNTER — Ambulatory Visit: Admitting: Infectious Disease

## 2019-09-19 NOTE — Progress Notes (Signed)
 COVID-19 Pfizer 30 mcg  [CVX:208 [id number]]  SERIES:  1  ADMINISTERED:  09/19/2019  ADMINISTERED BY:  LYN BLANCHARD-CAESAR  SITE:  Right Deltoid  OBSERVED FOR 15 MINUTES:  Y  TRANSFERRED TO THE ED:  N  REACTION:  N  REACTION COMMENTS:

## 2019-10-08 ENCOUNTER — Ambulatory Visit

## 2019-10-08 ENCOUNTER — Ambulatory Visit: Admitting: Internal Medicine

## 2019-10-08 NOTE — Telephone Encounter (Signed)
 Phone Note -     Patient    Routine    Call back at Ph1 [phone]  Initial call taken by: Link Snuffer,  October 08, 2019 11:43 AM  Initial Details of Call:  pt called to make NP ENT appt for sore throat that has been going on for a year. booked 5.11.21

## 2019-10-08 NOTE — Progress Notes (Signed)
 Visit Type:  Annual Physical  Referring Provider:  [name]  Primary Provider:  [name] MD      History of Present Illness:  Timothy Morris is a 49 Years Old Male who presents today for: CPE    Specialists seen since last visit? ENT re sore thraot dry mouth tongue pain  TH visit etiology felt perhaps due to GER however empirical treatment not helpful Pt would like follow up         ** Pt had Positive Covid test on 09/22/2019 **  two weeks after his fist Covid Vaccine   His sx were GI loose stools per patient   He want to know if he can postpone the next Covid vaccine in favor facet injections in his back which he needs to relieve the chronic back pain so he can get back to work he has been out of work for the last 6 months or more     Medications Prior to this Visit  VIAGRA 100 MG ORAL TABLET (SILDENAFIL CITRATE) 1/2 - 1 by mouth daily as needed 1 hour prior to intercourse  AMLODIPINE BESY-BENAZEPRIL HCL 10-40 MG ORAL CAPSULE (AMLODIPINE BESY-BENAZEPRIL HCL) Daily for blood pressure; Route: ORAL    Past Medical History  Hypertension  Gout   ED    Surgical History  Right Arthroscopy x 2   Right Rottor Cuff  RIH  Colectomy Sigmoid Dr [name]  2011  (Diverticulitis)  Pilonodal Cyst    Family History  Father Healthy  Estranged  Mother COPD Breast Cancer  RN at [employer]  Siblings  Sisters Healthy  2021    Social History  Marital Status: Divorced.  Children:  2    Lives With:, [place]   Occupation: Unemployed      Risk Factors  Tobacco User: no  Smoking Status: never smoked  Passive smoke exposure: No    Drug use: no  Alcohol use: no  HIV high risk behavior: no    Exercise: Yes  Exercise Comments:  job aerobic     Caffeine (drinks/day): 1  Sun exposure: rarely  Seatbelt use (%): 100  Family History MI in male age < 17: no  Family History MI in male age < 3: no        Review of Systems   General: Denies fever, chills, sweats, anorexia, fatigue, weakness, malaise, weight loss.   Eyes: Denies visual change or blurring,  eye pain.   Ears/Nose/Throat: Denies earache, decreased hearing, difficulty swallowing. mosly in the am but through the day sore throat  described as dry sore scratchy  can't produce spit some days worse  than others since last April  Also tongue always coated with grey film  despite tongue scrubbing   Seen by Dentist recently no explanation for this provided   Cardiovascular: Denies chest pain or pressure, palpitations, shortness of breath.   Respiratory: Denies dry cough, productive cough, shortness of breath, wheezing.   Gastrointestinal: Denies acid indigestion, nausea, vomiting, diarrhea, abdominal pain, change in bowel habits, constipation, mucous or blood in stools.   Musculoskeletal: Denies muscle cramps or aches, muscle weakness, morning stiffness, joint pain, joint swelling.   Skin: Denies dry skin, rash, skin ulcers, suspicious lesions.   Psychiatric: Denies anxiety, depression, insomnia.     Vital Signs     Patient: 49 Years Old Male  Height:  70.5 in.  Weight: 236 lbs      Wt Chg: 7 since 05/22/2019  BMI:  33.50  32.51 on 05/22/2019  Old BP:  137/71 on 05/22/2019    151/107 left arm, large cuff, seated    175/113 left arm, seated, home this AM  Pulse:  81         Pulse Ox: 97 %  On Oxygen: No    Patient is experiencing Pain  Location: back    Type: chronic     Medications and Allergies Reviewed    Signed: [name].Marland KitchenMarland KitchenMarland KitchenApril 27, 2021 10:08 AM    PHQ 2    Over the last 2 weeks, how often have you been bothered by any of the following problems?  1. Little interest or pleasure in doing things:  0   - Not at all  2. Feeling down, depressed, or hopeless:  0   - Not at all    Laboratory Data    Lipid Panel   Cholesterol: 217  HDL: 30  Triglycerides: 452  Cholesterol/HDL Ratio: 7.4   New Orders:  Patient Encounter [[id number]]  Sleep Study [Sleep]  Otolaryngology Referral [OTOL]  Prev, Est, (40-64) [[id number]]          Physical Exam    General:      anxious  Head:      normocephalic and  atraumatic.    Eyes:      PERRL/EOM intact, conjunctiva and sclera clear with out nystagmus.    Ears:      TM's intact and clear with normal canals with grossly normal hearing.    Mouth:      no deformity or lesions with good dentition.    Neck:      no masses, thyromegaly, or abnormal cervical nodes.    Chest Wall:      no deformities or breast masses noted.    Lungs:      clear bilaterally to auscultation.    Heart:      regular rhythm, normal rate and no murmurs.    Abdomen:       normal bowel sounds; no hepatosplenomegaly no ventral,umbilical hernias or masses noted.    Genitalia:      normal male, testes descended bilaterally without masses, no hernias, no varicoceles noted.  circumcised.    Msk:      no deformity or scoliosis noted of thoracic or lumbar spine.    Extremities:      no clubbing, cyanosis, edema, or deformity noted with normal full range of motion of all joints.    Neurologic:      no focal deficits,   Skin:      intact without lesions or rashes.    Cervical Nodes:      no significant adenopathy.    Psych:      anxious.      Test Results:     WBC:     5.7    (05/22/2019)  Hemoglobin:    15.3    (05/22/2019)  Hematocrit:    44.3    (05/22/2019)  MCV:     93.9    (05/22/2019)  MCH:     32.4 PGM    (05/22/2019)  PLatelets:    206 K/UL    (05/22/2019)  TSH Ult:    1.060 MCLU/ML    (11/29/2018)  Total Prot:    7.8    (05/22/2019)  Albumin:    4.2    (05/22/2019)  AST:     34    (05/22/2019)  ALT:     70    (05/22/2019)  ALK PHOS:  72    (05/22/2019)  LDL:     135    (06/20/2017)  Hgb A1C:    5.1    (01/15/2016)  Glucose:    88    (05/22/2019)  Sodium:    140    (05/22/2019)  Potassium:    4.2    (05/22/2019)  Chloride:    107    (05/22/2019)  Bicarbonate:    25    (05/22/2019)  BUN:     19    (05/22/2019)  Creatinine:    1.04    (05/22/2019)  eGFR:     83    (05/22/2019)  Serum Calcium:   9.4    (05/22/2019)      Impression and Recommendations:     ~PREVENTATIVE HEALTH CARE (Z00.00)  This exam was  unremarkable   Commitment made to follow healthy lifestyle going forward  USPTF measures  have been addressed     ~HYPERLIPIDEMIA (E78.5)    Medication(s): ATORVASTATIN CALCIUM 40 MG ORAL TABLET: Take one by mouth daily to lower cholesterol     ~HYPERTENSION (I10)  today's blood pressure was high .  Patient is compliant with medications and lifestyle recommendations.  Add B Blocker in part due to suspected contribution of anxiety Home blood pressure monitoring encouraged.       ~OBESITY (BMI = 30-39.9) (E66.9)  Discussed making healthy lifestyle changes, weight loss,  increasing exercises, changing diet and  adherence to  these changes         ~SORE THROAT (ACUTE) (J02.9)  ??   Some of thse sx sound c/w sleep apnes so I will set up sleep study for home     Order(s): Otolaryngology Referral          Changes to Medication List Documented Today:   METOPROLOL SUCCINATE ER 50 MG ORAL TABLET EXTENDED RELEASE 2 (METOPROLOL SUCCINATE) Take one by mouth once daily for heart and blood pressure; Route: ORAL  ATORVASTATIN CALCIUM 40 MG ORAL TABLET (ATORVASTATIN CALCIUM) Take one by mouth daily to lower cholesterol; Route: ORAL  Orders:   Added new Service order of Patient Encounter ([id number]) - Signed  Added new Test order of Sleep Study (Sleep) - Signed  Added new Referral order of Otolaryngology Referral (OTOL) - Signed  Added new Service order of Prev, Est, (40-64) ([id number]) - Signed    Patient Instructions    follow up in one month    Medications:  ATORVASTATIN CALCIUM 40 MG ORAL TABLET (ATORVASTATIN CALCIUM) Take one by mouth daily to lower cholesterol  #90[Tablet] x 4   Route:ORAL   Entered and Authorized by: [name] MD   Signed by: [name] MD on 10/08/2019   Method used: Electronically to      [id number] (retail)     [address]     [place], [place]  [address]     Ph: [phone] or [phone]     Fax: [phone]   Note to Pharmacy: Route: ORAL;    RxID: [id number]  METOPROLOL SUCCINATE ER  50 MG ORAL TABLET EXTENDED RELEASE 2 (METOPROLOL SUCCINATE) Take one by mouth once daily for heart and blood pressure  #90[Tablet] x 3   Route:ORAL   Entered and Authorized by: [name] MD   Signed by: [name] MD on 10/08/2019   Method used: Electronically to      [facility] (retail)     [address]     [place], [place]  [address]     Ph: [phone]  or [phone]     Fax: [phone]   Note to Pharmacy: Route: ORAL;SAVINGS FOR NON-COVERED MEDICATIONS-For claims: [id number] [id number] Group:[id number] IR:[id number];            Questions: [employer] [phone]    RxID: [id number]        SLEEP STUDY ORDER FORM:  Please attach clinical notes and fax to [id number]  [facility]      [address], [place], [place] [address]  Tel: [phone]  Fax: [phone]    Patient: Timothy Morris    DOB: 11/29/1970    Height: 70.5 in   Weight:  236 lb   BMI:  33.50  Home: [phone]    Cell: [phone]    Work: [phone]      Address: [address] [place], [place] [address]  Primary Care Provider:  [name] MD  Insurance: [place]    ID:  [id number]    Group:  [id number]        SECTION A: Study Type:       SECTION B: APPROPRIATE TYPE OF TEST   * Proceed with HSAT [phone]) if criteria not met.  Home Sleep Apnea Testing (HSAT) (95806)/(G0399)    SECTION C: SUSPECTED DIAGNOSIS AND ICD-10 CODE (REQUIRED)   G47.30 SLEEP APNEA unspecified    SECTION D: REASON FOR TESTING   Loud snoring (along with other symptoms)  Gasps or snorts in sleep  10    Comorbid Medical Conditions    ** Contraindications for HSAT  Hypertension    SECTION E: ORDERING PHYSICIAN   Physician Name:  [name] MD  Address:  [address], [place], [place] [address]  Tel:  [phone]    Fax:  [phone]

## 2019-10-11 ENCOUNTER — Ambulatory Visit

## 2019-10-11 NOTE — Progress Notes (Signed)
 Orders:  Added new Referral order of Physiatry Referral (PHYS) - Signed    .refsum

## 2019-10-15 ENCOUNTER — Ambulatory Visit

## 2019-10-21 ENCOUNTER — Ambulatory Visit

## 2019-10-22 ENCOUNTER — Ambulatory Visit

## 2019-10-22 NOTE — Progress Notes (Signed)
 Dear Dr. [name],        I had the pleasure of seeing your patient, Timothy Morris, at [facility].  Please see my evaluation below.  Thank you for the opportunity to participate in the care of your patient.        Best regards,    Orland Jarred, MD      History of Present Illness  PCP: [name] MD  Visit Reason New Visit  Chief Complaint: Throat Complaint    49 year old male presents today for evaluation of his throatand voice.  He has noted some moderate dryness in his mouth and throat associated with some mild intermittent throat irritation and pain for the past 1 year.  He also notes intermittent associated hoarseness.  However, he denies dyspnea or dysphagia.  He drinks caffeinated beverages regularly and enjoys carbonated water frequently.  He is fairly careful with his diet otherwise.  He occasionally gets heartburn, but takes nothing for this regularly.  He denies fever, chills, or any constitutional symptoms.  He smokes marijuana regularly for medical reasons, but denies any tobacco or alcohol use.        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) (XFQ72-U57.9)  THROAT PAIN, CHRONIC (ICD-784.1) (ICD10-R07.0)  HYPERLIPIDEMIA (ICD-272.4) (ICD10-E78.5)  OBESITY (BMI = 30-39.9) (ICD-278.00) (ICD10-E66.9)  GOUT (ICD-274.9) (ICD10-M10.9)  HYPERTENSION (ICD-401.1) (ICD10-I10)      Medication List: (Reviewed)  VIAGRA 100 MG ORAL TABLET (SILDENAFIL CITRATE) 1/2 - 1 by mouth daily as needed 1 hour prior to intercourse  AMLODIPINE BESY-BENAZEPRIL HCL 10-40 MG ORAL CAPSULE (AMLODIPINE BESY-BENAZEPRIL HCL) Daily for blood pressure; Route: ORAL  METOPROLOL SUCCINATE ER 50 MG ORAL TABLET EXTENDED RELEASE 2 (METOPROLOL SUCCINATE) Take one by mouth once daily for heart and blood pressure; Route: ORAL  ATORVASTATIN CALCIUM 40 MG ORAL TABLET (ATORVASTATIN CALCIUM) Take one by mouth daily to lower cholesterol;  Route: ORAL      Allergies Reviewed  Allergies    No Known Allergies  Past Medical History: Hypertension  Gout   ED  Past Surgical History: Right Arthroscopy x 2   Right Rottor Cuff  RIH  Colectomy Sigmoid Dr [name]  2011  (Diverticulitis)  Pilonodal Cyst  Past Family History: Father Healthy  Estranged  Mother COPD Breast Cancer  RN at [employer]  Siblings  Sisters Healthy  2021  Social History: Marital Status: Divorced.  Children:  2    Lives With:, [place]   Occupation: Unemployed        Tobacco use: never smoked  Alcohol use: no    Review Of Systems  General: Denies weight loss, fevers, chills, night sweats, fatigue.   Neurologic: Denies tingling,numbness,weakness,dizziness,frequent headaches.   Psychiatric: Denies anxiety, depression, claustrophobia.   Eyes: Denies loss of vision, double vision, dry eye(s), itching.   Ears/Nose/Throat: Complains of sore throat, dry mouth. Denies frequent nose bleeds, sore teeth, hearing loss, nasal congestion.   Cardiovascular: Denies palpitations, chest pain.   Respiratory: Complains of cough. Denies shortness of breath, wheezing.   Gastrointestinal: Denies abdominal pain, vomiting, change in bowel habits.   Hematology: Denies easy bruising, bleeding, swollen lymph nodes.   Genitourinary: Denies blood in urine, burning with urination, genital lesions/sores.   Musculoskeletal: Complains of joint pain, back pain, neck pain. Denies muscle pain.   Skin: Denies rash, itching, scalp lesions, hair loss.   Endocrine: Denies excessive thirst, heat/cold intolerance.       Vital Signs   Weight: 236 lb. (107.27  kg.)    Height: 70.5 in. (179.07 cm.)  BMI: 33.50    Jeffie Pollock ......................Oct 22, 2019 11:34 AM      General:  Awake and alert.  No distress.  Voice is strong.  No stridor.  Gait is normal.  Skin: Normal turgor, no lesions.  Neurological: Cranial nerves intact bilaterally. Facial motor function is symmetric bilaterally.  Facial sensation equal bilaterally in  all divisions.    Eyes: EOMI, PERRLA, conjunctiva clear, no nystagmus.  Ears: Mastoid and auricle normal bilaterally.  External auditory canal patent bilaterally.  Tympanic membrane intact and mobile bilaterally. Tuning forks are normal.  Nose/face: No external deformity.  Septum is midline. Normal mucous membranes. Normal turbinates, no anterior masses.  No sinus tenderness.  Oral cavity/oropharynx: Dentition adequate.  Normal gingiva and mucous membranes. Floor of mouth is soft and tongue is mobile.  Hard palate normal.  Soft palate normal.  Oropharynx symmetric without mass effect or mucosal lesion. No TMJ tenderness or crepitus.  Neck: Full range of motion. Larynx and trachea midline.  Parotid gland and submandibular gland normal bilaterally.  Thyroid is normal size and anodular. No lymphadenopathy.  Carotid pulses 2+ bilaterally.    PROCEDURE NOTE  Procedure: Fiber-optic laryngoscopy.  Following decongestion and topical anesthesia with Afrin and lidocaine solution, the fiber-optic endoscope was used to examine   larynx and pharynx   which could not be viewed with a mirror because of patient's anatomy and active gag reflex  Indications: Throat pain  Findings: Pharyngeal mucosa appears normal. Base of tongue appears normal bilaterally.  Vallecula is clear.  Hypopharyngeal walls and piriform sinuses appear normal.  Postcricoid region is inflamed.  Epiglottis is normal.  AE folds normal.  True vocal cords are mobile bilaterally.  Arytenoids and posterior true cords are erythematous and edematous bilaterally.  No mucosal lesions.               Assessment and Plan:      ~XEROSTOMIA       ~LARYNGOPHARYNGEAL REFLUX       ~THROAT PAIN, CHRONIC      .  There is no evidence of mass or infection.  His symptoms may be related to fungal pharyngeal reflux.  I recommended a reflux dietary and behavioral regimen.  He will begin omeprazole 20 mg twice daily.  He will return in 2 months.  We will consider further diagnostic  testing as needed at that time for any persistent complaints.  Orland Jarred MD, FACS    Problems Reviewed  Patient/Caregiver understand instructions and plan.

## 2019-10-29 ENCOUNTER — Ambulatory Visit: Admitting: Internal Medicine

## 2019-10-29 ENCOUNTER — Ambulatory Visit: Admitting: Infectious Disease

## 2019-10-29 NOTE — Progress Notes (Signed)
COVID-19 Pfizer 30 mcg  [CVX:208 NDC:[id number]]  SERIES:  2  ADMINISTERED:  10/29/2019  ADMINISTERED BY:  MARGUERITE ZINCK, RN  SITE:  Left Deltoid  OBSERVED FOR 15 MINUTES:  Y  TRANSFERRED TO THE ED:  N  REACTION:  N  REACTION COMMENTS:

## 2019-11-19 ENCOUNTER — Ambulatory Visit

## 2019-11-19 NOTE — Progress Notes (Signed)
Visit Type:  Follow-up Visit  Referring Provider:  [name]  Primary Provider:  [name] MD      History of Present Illness:  Timothy Morris is a 49 Years Old Male who presents today for: Follow up BP At last visit he asked for a chance to lose weight before adding more meds  He has done that  He feels well\    Specialists seen since last visit? ENT, evalaution for xerostomia felt to be reflux related He is taking PPI with some benefit    prior to visit chart was reviewed and needed clinical screening, lab testing,  discussed with medical assistant\      Medications Prior to this Visit  VIAGRA 100 MG ORAL TABLET (SILDENAFIL CITRATE) 1/2 - 1 by mouth daily as needed 1 hour prior to intercourse  AMLODIPINE BESY-BENAZEPRIL HCL 10-40 MG ORAL CAPSULE (AMLODIPINE BESY-BENAZEPRIL HCL) Daily for blood pressure; Route: ORAL  METOPROLOL SUCCINATE ER 50 MG ORAL TABLET EXTENDED RELEASE 2 (METOPROLOL SUCCINATE) Take one by mouth once daily for heart and blood pressure; Route: ORAL  ATORVASTATIN CALCIUM 40 MG ORAL TABLET (ATORVASTATIN CALCIUM) Take one by mouth daily to lower cholesterol; Route: ORAL    Past Medical History  Hypertension  Gout   ED    Surgical History  Right Arthroscopy x 2   Right Rottor Cuff  RIH  Colectomy Sigmoid Dr [name]  2011  (Diverticulitis)  Pilonodal Cyst    Family History  Father Healthy  Estranged  Mother COPD Breast Cancer  RN at [employer]  Siblings  Sisters Healthy  2021    Social History  Marital Status: Divorced.  Children:  2    Lives With:, [place]   Occupation: Unemployed      Risk Factors  Tobacco User: no  Smoking Status: never smoked  Passive smoke exposure: No    Drug use: no  Alcohol use: no  HIV high risk behavior: no    Exercise: Yes  Exercise Comments:  job aerobic     Caffeine (drinks/day): 1  Sun exposure: rarely  Seatbelt use (%): 100  Family History MI in male age < 49: no  Family History MI in male age < 14: no        Review of Systems   General: Denies fever, chills, sweats,  anorexia, fatigue, weakness, malaise, weight loss.   Cardiovascular: Denies chest pain or pressure, palpitations, shortness of breath.   Respiratory: Denies dry cough, productive cough, shortness of breath, wheezing.   Gastrointestinal: Denies acid indigestion, nausea, vomiting, diarrhea, abdominal pain, change in bowel habits, constipation, mucous or blood in stools.     Vital Signs     Patient: 49 Years Old Male  Height:  70.5 in.  Weight: 231 lbs      Wt Chg: -5 since 10/22/2019  BMI:  32.79        33.50 on 10/22/2019  BP:  130/88 left arm, large cuff, seated     137/71 on 05/22/2019   Pulse:  60         Pulse Ox: 98 %  On Oxygen: No    Patient is experiencing Pain  Location: back    Type: chronic   Are you having trouble paying for your medications?  No    Medications and Allergies Reviewed    Signed: [name]....November 19, 2019 10:52 AM    PHQ 2    Over the last 2 weeks, how often have you been bothered by any of  the following problems?  1. Little interest or pleasure in doing things:  0   - Not at all  2. Feeling down, depressed, or hopeless:  0   - Not at all          Physical Exam    General:      well developed, well nourished, in no acute distress.    Lungs:      clear bilaterally to auscultation.    Heart:      regular rhythm, normal rate and no murmurs.    Neurologic:      no focal deficits,   Psych:      alert and cooperative; normal mood and affect; normal attention span and concentration.      Test Results:     LDL:     135    (06/20/2017)  Hgb A1C:    5.1    (01/15/2016)  Glucose:    88    (05/22/2019)  Potassium:    4.2    (05/22/2019)  Creatinine:    1.04    (05/22/2019)      Impression and Recommendations:     ~HYPERTENSION (I10)  today's blood pressure was good and meets JNC 8 guidelines.  Patient is compliant with medications and lifestyle recommendations.  No change to current regimen made today.  Home blood pressure monitoring encouraged.    Medication(s): AMLODIPINE BESY-BENAZEPRIL  HCL 10-40 MG ORAL CAPSULE: Daily for blood pressure, METOPROLOL SUCCINATE ER 50 MG ORAL TABLET EXTENDED RELEASE 2: Take one by mouth once daily for heart and blood pressure  Order(s): OV Est Level III        Problems Reviewed  Orders:   Added new Service order of Patient Encounter ([id number]) - Signed  Added new Service order of OV Est Level III ([id number]) - Signed    Patient Instructions    follow up in 6 months

## 2019-11-28 ENCOUNTER — Ambulatory Visit

## 2019-11-28 NOTE — ED Provider Notes (Signed)
 [facility] - Emergency Report      Imported By: Julious Oka Dodson 12/02/2019 11:35:25 AM    _____________________________________________________________________    External Attachment:      Type: Image      Comment: External Document

## 2019-11-29 ENCOUNTER — Ambulatory Visit

## 2019-12-20 ENCOUNTER — Ambulatory Visit

## 2019-12-20 NOTE — Progress Notes (Signed)
Orders:  Added new Referral order of Orthopedics Referral (ORTHO) - Signed    .refsum

## 2019-12-20 NOTE — Telephone Encounter (Signed)
 Phone Note -     Patient    Routine    Initial call taken by: Lauren L. Bagarella,  December 20, 2019 8:30 AM  Initial Details of Call:  Called patient to confirm follow up appt on 12/23/19. Patient cancled appt and declined a reschedule at this time.

## 2019-12-23 ENCOUNTER — Ambulatory Visit

## 2020-01-10 ENCOUNTER — Ambulatory Visit

## 2020-01-13 ENCOUNTER — Ambulatory Visit

## 2020-01-13 NOTE — Progress Notes (Signed)
 Orders:  Added new Referral order of Orthopedics Referral (ORTHO) - Signed    .refsum

## 2020-01-20 ENCOUNTER — Ambulatory Visit

## 2020-01-21 ENCOUNTER — Ambulatory Visit

## 2020-02-21 ENCOUNTER — Ambulatory Visit

## 2020-02-29 ENCOUNTER — Ambulatory Visit

## 2020-03-10 ENCOUNTER — Ambulatory Visit

## 2020-03-25 ENCOUNTER — Ambulatory Visit

## 2020-04-08 ENCOUNTER — Ambulatory Visit

## 2020-04-15 ENCOUNTER — Ambulatory Visit

## 2020-05-15 ENCOUNTER — Ambulatory Visit

## 2020-05-18 ENCOUNTER — Ambulatory Visit: Admitting: Internal Medicine

## 2020-05-18 ENCOUNTER — Ambulatory Visit

## 2020-05-18 NOTE — Progress Notes (Signed)
 Visit Type:  Follow-up Visit  Referring Provider:  [name]  Primary Provider:  [name] MD      History of Present Illness:  Timothy Morris is a 49 Years Old Male who presents today for: a Follow up visit for blood pressure check    still having back pain and sciatica such that he can nont work  Waiting on appt for ablation procedure "they are going to burn the nerve"    Also having discomfort back of his throat no better after seeing ENT and being told it was reflux and placed o PPI  Want another ENT opinion    Specialists seen since last visit?Dr. Dianah Field  [facility]    prior to visit chart was reviewed and needed clinical screening, lab testing,  discussed with medical assistant'      Medications Prior to this Visit  VIAGRA 100 MG ORAL TABLET (SILDENAFIL CITRATE) 1/2 - 1 by mouth daily as needed 1 hour prior to intercourse  AMLODIPINE BESY-BENAZEPRIL HCL 10-40 MG ORAL CAPSULE (AMLODIPINE BESY-BENAZEPRIL HCL) Daily for blood pressure; Route: ORAL  METOPROLOL SUCCINATE ER 50 MG ORAL TABLET EXTENDED RELEASE 2 (METOPROLOL SUCCINATE) Take one by mouth once daily for heart and blood pressure; Route: ORAL  ATORVASTATIN CALCIUM 40 MG ORAL TABLET (ATORVASTATIN CALCIUM) Take one by mouth daily to lower cholesterol; Route: ORAL    Past Medical History  Hypertension  Gout   ED    Surgical History  Right Arthroscopy x 2   Right Rottor Cuff  RIH  Colectomy Sigmoid Dr [name]  2011  (Diverticulitis)  Pilonodal Cyst    Family History  Father Healthy  Estranged  Mother COPD Breast Cancer  RN at [employer]  Siblings  Sisters Healthy  2021    Social History  Marital Status: Divorced.  Children:  2    Lives With:, [place]   Occupation: Unemployed  Short Order Cook      Risk Factors  Tobacco User: no  Smoking Status: never smoked  Passive smoke exposure: No    Drug use: no  Alcohol use: no  HIV high risk behavior: no    Exercise: Yes  Exercise Comments:  job aerobic     Caffeine (drinks/day): 1  Sun exposure:  rarely  Seatbelt use (%): 100  Family History MI in male age < 46: no  Family History MI in male age < 84: no        Review of Systems   General: Denies fever, chills, sweats, anorexia, fatigue, weakness, malaise, weight loss.   Cardiovascular: Denies chest pain or pressure, palpitations, shortness of breath.   Respiratory: Denies dry cough, productive cough, shortness of breath, wheezing.     Vital Signs     Patient: 49 Years Old Male  Height:  70.5 in.  Weight: 240 lbs      Wt Chg: 9 since 11/19/2019  BMI:  34.07        32.79 on 11/19/2019  BP:  134/88 left arm, large cuff, seated     130/88 on 11/19/2019   Temp:  98.29  F    temporal  Pulse:  68         Pulse Ox: 96 %  On Oxygen: No    Patient is not experiencing pain  Are you having trouble paying for your medications?  No    Medications and Allergies Reviewed    Signed: Fonnie Jarvis Green.Marland KitchenMarland KitchenMarland KitchenDecember  6, 2021 11:42 AM    PHQ 2  Over the last 2 weeks, how often have you been bothered by any of the following problems?  1. Little interest or pleasure in doing things:  0   - Not at all  2. Feeling down, depressed, or hopeless:  0   - Not at all        Vaccines the patient has declined   Flu Vaccine: Patient declined         Physical Exam    General:      well developed, well nourished, in no acute distress.    Head:      normocephalic and atraumatic.    Ears:      TM's intact and clear with normal canals with grossly normal hearing.    Mouth:      no deformity or lesions with good dentition.    Lungs:      clear bilaterally to auscultation.    Heart:      regular rhythm, normal rate and no murmurs.    Msk:      no deformity or scoliosis noted of thoracic or lumbar spine.    Neurologic:      no focal deficits, sit to stand normal gait normal   Skin:      intact without lesions or rashes.    Psych:      anxious.          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 no symptoms thought to be angina.    Medications appropriate for prevention of ASPVD    Medication(s): ATORVASTATIN CALCIUM 40 MG ORAL TABLET: Take one by mouth daily to lower cholesterol     ~HYPERTENSION (I10)  today's blood pressure was good and meets JNC 8 guidelines.  Patient is compliant with medications and lifestyle recommendations.  No change to current regimen made today.  Home blood pressure monitoring encouraged.          Problems Reviewed  Med Compliance and SE's: Pt is compliant with meds with no side effects   Patient/Caregiver understand instructions and plan.    Changes to Medication List Documented Today:   Carafate 1 gram tablet (sucralfate) 1 tablet by mouth twice a day   omeprazole 20 mg capsule,delayed release(DR/EC) (omeprazole) Take 1 capsule by mouth once a day   Orders:   Added new Service order of Patient Encounter ([id number]) - Signed  Added new Test order of Sleep Study (Sleep) - Signed  Added new Service order of OV Est Level IV ([id number]) - Signed  Added new Service order of Venipuncture ([id number]) - Signed  Added new Test order of BMP - Basic Metabolic Panel (BMP) - Signed  Added new Test order of LIPID -Lipid Panel** (LIPR) - Signed  Added new Test order of LIVER - Liver Function Panel (LIVER) - Signed  Added new Test order of CBC -CBC Only (No Diff)** (CBCO) - Signed  Added new Test order of CRP - C Reactive Protein (CRP) - Signed    Patient Instructions    folllow up in 6 months    Medications:  omeprazole 20 mg capsule,delayed release(DR/EC) (omeprazole) Take 1  capsule by mouth once a day   #90 capsule x 3   Entered and Authorized by: [name] MD   Signed by: [name] MD  on 05/18/2020   Method used: Signed not sent   RxID: [id number]  Carafate 1 gram tablet (sucralfate) 1 tablet by mouth twice a day   #60 tablet x 0   Entered and Authorized by: [name] MD   Signed by: [name] MD on 05/18/2020   Method used: Electronically to        [id number] [address], [place], [place] [address], Ph: [phone] Fax: [phone]     Indications: LARYNGOPHARYNGEAL REFLUX   RxID: [id number]          ][Sleep Study Order Form]

## 2020-05-19 ENCOUNTER — Ambulatory Visit

## 2020-05-19 ENCOUNTER — Ambulatory Visit: Admitting: Internal Medicine

## 2020-05-19 LAB — HX BASIC METABOLIC PANEL
HX ANION GAP: 3 (ref 3.0–11.0)
HX BICARBONATE: 29 mmol/L (ref 21.0–32.0)
HX BUN: 15 mg/dL (ref 6.0–20.0)
HX CALCIUM: 9.5 mg/dL (ref 8.5–10.5)
HX CHLORIDE: 105 mmol/L (ref 98.0–110.0)
HX CREATININE: 1.05 mg/dL (ref 0.55–1.3)
HX GLOMERULAR FR AFRICAN AMERICAN: >90
HX GLOMERULAR FR NON AFRICAN AMER: 82
HX GLUCOSE: 89 mg/dL (ref 70.0–110.0)
HX POTASSIUM: 4.3 mmol/L (ref 3.6–5.2)
HX SODIUM: 137 mmol/L (ref 136.0–146.0)

## 2020-05-19 LAB — HX  COMPLETE BLOOD COUNT
HX HEMATOCRIT: 46.4 % (ref 39.0–53.0)
HX HEMOGLOBIN: 15.9 g/dL (ref 13.0–17.5)
HX MEAN CORP.HEMO.CONC.: 34.3 g/dL (ref 31.0–37.0)
HX MEAN CORPUSCULAR HEMOGLOBIN: 32.8 pg (ref 26.0–34.0)
HX MEAN CORPUSCULAR VOLUME: 95.7 fL (ref 80.0–100.0)
HX MEAN PLATELET VOLUME: 12.4 fL (ref 9.4–12.4)
HX NUCLEATED RBC %: 0 % (ref 0.0–0.0)
HX PLATELET COUNT: 195 10*3/uL (ref 150.0–400.0)
HX RED BLOOD COUNT: 4.85 10*6/uL (ref 4.2–5.9)
HX RED CELL DISTRIBUTION WIDTH SD: 45 fL (ref 35.0–51.0)
HX WHITE BLOOD COUNT: 7.1 10*3/uL (ref 4.0–11.0)

## 2020-05-19 LAB — HX LIVER FUNCTION PANEL
HX ALBUMIN: 4.3 g/dL (ref 3.2–5.0)
HX ALKALINE PHOSPHATASE: 74 U/L (ref 30.0–117.0)
HX ALT: 116 U/L — ABNORMAL HIGH (ref 6.0–55.0)
HX AST: 55 U/L — ABNORMAL HIGH (ref 6.0–40.0)
HX DIRECT BILIRUBIN: 0.1 mg/dL (ref 0.0–0.3)
HX TOTAL BILIRUBIN: 0.5 mg/dL (ref 0.2–1.2)
HX TOTAL PROTEIN: 8.1 g/dL (ref 6.0–8.4)

## 2020-05-19 LAB — HX LIPID PANEL FASTING
HX CHOLESTEROL (LIPR): 148 mg/dL (ref <=200)
HX HDL CHOLESTEROL: 32 mg/dL — ABNORMAL LOW (ref >=40)
HX LDL CHOLESTEROL: 41 mg/dL (ref <=130)
HX TRIGLYCERIDES: 376 mg/dL — ABNORMAL HIGH (ref <=150)

## 2020-05-19 LAB — HX C REACTIVE PROTEIN (QUANT): HX C REACTIVE PROTEIN (QUANT): <0.29 (ref 0.0–0.8)

## 2020-05-19 NOTE — Telephone Encounter (Signed)
 Phone Note -       Initial call taken by: [name] MD,  May 19, 2020 6:35 AM  Initial Details of Call:  call and let this patient know all of the recent blood tests were normal    except liver enzymes elevated due to excess fat in the liver  Follow low fat low sugar diet   repeat liver tests  in one year       Follow-up #1  Action: Phone call completed, Patient Notified  By: Julious Oka Mecosta ~ May 19, 2020 8:56 AM

## 2020-05-19 NOTE — Progress Notes (Signed)
Problems:  Added new problem of FATTY LIVER DISEASE (ICD-571.8) (ICD10-K76.0)

## 2020-06-02 ENCOUNTER — Ambulatory Visit

## 2020-06-02 NOTE — Telephone Encounter (Signed)
 Phone Note -       Initial call taken by: Orson Aloe,  June 02, 2020 3:47 PM  Initial Details of Call:  Sleep sched sleep cons w/ Dr. Bufford Buttner on Fri 10/02/20@1 :30pm    Susp sleep apnea    Loud snoring   ESS 10  obesity

## 2020-07-01 ENCOUNTER — Ambulatory Visit: Admitting: Internal Medicine

## 2020-07-01 NOTE — Progress Notes (Signed)
 COVID-19 Pfizer 30 mcg  [CVX:208 NDC:]  SERIES:  3  ADMINISTERED:  07/01/2020  ADMINISTERED BY:  Regino Schultze, RN  SITE:  Left Deltoid  OBSERVED FOR 15 MINUTES:  N  TRANSFERRED TO THE ED:  N  REACTION:  N  REACTION COMMENTS:

## 2020-08-12 ENCOUNTER — Ambulatory Visit

## 2020-08-12 NOTE — Progress Notes (Addendum)
Orders:  Added new Referral order of Physiatry Referral (PHYS) - Signed

## 2020-08-18 ENCOUNTER — Ambulatory Visit: Admitting: Internal Medicine

## 2020-08-18 NOTE — Progress Notes (Addendum)
Orders:  Added new Referral order of Sleep Medicine Referral (SLEEPMED) - Signed

## 2020-08-19 ENCOUNTER — Ambulatory Visit: Admitting: Pulmonary Disease

## 2020-08-19 ENCOUNTER — Ambulatory Visit

## 2020-08-19 NOTE — Progress Notes (Addendum)
 SLEEP STUDY ORDER FORM:  Please attach clinical notes and fax to [phone]  [facility]      [address] [place], [place] [address]  Tel: [phone]  Fax: [phone]    Patient: Timothy Morris    DOB: [birth year]    Height: 70.5 in   Weight:  240 lb   BMI:  34.07  Home: [phone]    Cell: [phone]    Work: [phone]      Address: [address] [place], [place] [address]  Primary Care Provider:  [name] MD  Insurance: [employer]    ID:  [id number]    Group:  [id number]        SECTION A: Study Type:       SECTION B: APPROPRIATE TYPE OF TEST   * Proceed with HSAT [phone]) if criteria not met.  Diagnostic PSG study* (95810) if study positive, schedule titration study (CPAP, BiPAP, or ASV as recommended - 76811)    SECTION C: SUSPECTED DIAGNOSIS AND ICD-10 CODE (REQUIRED)   G47.33 OBSTRUCTIVE SLEEP APNEA  G47.10 HYPERSOMNIA    SECTION D: REASON FOR TESTING   Loud snoring (along with other symptoms)    Comorbid Medical Conditions    ** Contraindications for HSAT  Hypertension    Additional Information   Special Instructions/Physician Notes:  HST if PSG denied    SECTION E: ORDERING PHYSICIAN   Physician Name:  Clearence Cheek MD  Address:  [address], [place], [place] [address]  Tel:  [phone]    Fax:  [phone]

## 2020-08-19 NOTE — Progress Notes (Addendum)
 Chief Complaint:  Sleep Evaluation  History of Present Illness:  Timothy Morris is a 50 years old male who comes in for a consult of sleep apnea.      Says main issue is his "Dry mouth", wakes up with dry mouth, says he has dry mouth all day, constantly is chewing gum to help with dry mouth.  Is not sure of his snoring, lives alone.  Has some daytinne fatigue.  No mornign headaches.  Wakes up a few times: says due to back pain.    No significant weight change.        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)  THROAT PAIN, CHRONIC (ICD10-R07.0)  HYPERLIPIDEMIA (ICD10-E78.5)  OBESITY (BMI = 30-39.9) (ICD10-E66.9)  GOUT (ICD10-M10.9)  HYPERTENSION (ICD10-I10)    Current Medications- Reviewed during today's visit  VIAGRA 100 MG ORAL TABLET (SILDENAFIL CITRATE): 1/2 - 1 by mouth daily as needed 1 hour prior to intercourse  AMLODIPINE BESY-BENAZEPRIL HCL 10-40 MG ORAL CAPSULE: Daily for blood pressure  METOPROLOL SUCCINATE ER 50 MG ORAL TABLET EXTENDED RELEASE 2: Take one by mouth once daily for heart and blood pressure  ATORVASTATIN CALCIUM 40 MG ORAL TABLET: Take one by mouth daily to lower cholesterol  Carafate 1 gram tablet (sucralfate): 1 tablet by mouth twice a day   Omeprazole 20 mg capsule,delayed release(DR/EC): Take 1 capsule by mouth once a day     Current Allergies  NO KNOWN ALLERGIES          General: Complains of some fatigue, sleep disorder.   Dry mouth  Dry eyes  Back pain.  Ears/Nose/Throat: Denies nasal congestion, difficulty swallowing, sore throat.   Cardiovascular: Denies chest pain or pressure, palpitations.   Respiratory: Denies cough, shortness of breath, wheezing.   Gastrointestinal:  Denies acid indigestion, nausea, vomiting, change in bowel habits.   Urologic: No Complains of nocturia.  Musculoskeletal: Positive for back pain.    Vital Signs     Patient: 50 Years Old Male  Height:  70.5 in.  Weight: 247 lbs  6 oz       Wt Chg: 7.38 since 05/18/2020  BMI:  35.12        34.07 on 05/18/2020  BP:  151/65     134/88 on 05/18/2020   Pulse:  65         Are you having trouble paying for your medications?  Yes    Comments: Current weight is 247.6.  Medications Reviewed           Epworth Sleepiness Study   Sitting and reading: 2  Watching TV: 2  Sitting inactive in a public place: 0  As a passenger in a car for an hour without break: 2  Lying down to rest in the afternoon when circumstances permit: 1  Sitting and talking to someone: 0  Sitting quietly after a lunch without alcohol: 1  In a car, while stopped for a few minutes in traffic: 0  Total Epworth Sleepiness Score: 8        Physical Exam  Constitutional: Pleasant male, comfortable at rest.  Was wearing a mask.  Ears: gross hearing intact.   Nose: non-tender sinuses.   Neck: no adenopathy, no masses.   Pulmonary: clear to auscultation.   Cardiac: S1 S2 normal.  Extremities: no edema.   Neurologic: Grossly nonfocal.   Psych: Oriented to all spheres, affect and mood appropriate, normal interaction.     Assessment and Plan:     1.  Suspect Sleep Apnea:  Due to his h/o  ? Snoring,   Some Daytime fatigue  Obesity  H/o HTN    Plan: Will schedule sleep study.  In the mean time, Timothy Morris was advised to avoid sleeping in a supine position as snoring/sleep apnea is worse in the supine position.    I also discussed the complications of untreated sleep apnea mainly consisting of Excessive Daytime Sleepiness, HTN and Arrythmias etc.  Briefly discussed the treatment options, CPAP, ENT, Dental evaluation and Weight loss.    2. H/O HTN: Discussed BP and Obstructive Sleep Apnea association, patients with untreated sleep apnea are know to have a higher incidence of  HTN.  At this time,  Timothy Morris will continue with  current medicaitons as well as pursue sleep apnea evaluation.    3. H/O GERD: Patients wtih untreated sleep apnea may have a higher incidence of GERD, plan at this time is to continue with current management and pursue sleep apnea evaluaion.    4. Obesity: Weight and sleep apnea link was discussed, Obese/Overweight patients are known to have a higher incidence of sleep apnea, Timothy Morris was advised to monitor his portion size, add daily walks as his activity.    5. 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    F/U: After sleep study.

## 2020-08-24 ENCOUNTER — Ambulatory Visit

## 2020-08-24 NOTE — Progress Notes (Signed)
 Sleep Center Verification/Authorization Work Sheet Date:  08/24/2020    NPI# [id number]  Tax ID# [id number]    Ordering Provider:  [facility] MEHTA  NPI:  [id number]  Procedure:  95810 PSG  Diagnosis:  G47.33 / G47.10                                                   **COMORBIDITIES: HTN / BMI > 33  Symptoms:  Loud snoring / ESS 8    Patient Name: Timothy Morris  DOB: 10-14-1970  Insurance:  [employer]  (P):  [phone]  Policy #:  [id number]    Verification Insurance:  09/03/2020 > Active via [employer]   Out of Pocket Cost to patient:  30% co-ins after DED$1000($0 rem)  PA Required:  Yes  If PA needed obtain from:  [employer]    Approved:  [id number] 09/03/20 - 12/02/20  Date Completed:  09/03/2020        Created By Orson Aloe on 08/24/2020 at 03:08 PM    Electronically Signed By Orson Aloe on 09/03/2020 at 08:04 PM

## 2020-09-03 ENCOUNTER — Ambulatory Visit

## 2020-09-08 NOTE — Teleconsult (Signed)
Phone Note -     Outgoing Call    Initial call taken by: [name] MD,  June 21, 2016 7:29 AM  Summary of Call: call and let this patient know all of the recent blood tests were normal except the cholesterol which is high  He should lose weight as we discussed and this will be rechecked in 6 months to see if medication to lower the cholesterol will be necessary         Follow-up #1  Action: Phone call completed, Patient Notified  By: Talbert Cage Lubeck ~ June 21, 2016 9:32 AM

## 2020-09-08 NOTE — Teleconsult (Signed)
Phone Note -     Outgoing Call    Follow up Call:: Post ER  Initial call taken by: Fonnie Jarvis [place],  January 03, 2018 11:12 AM  Call placed to: Patient  Detail: follow up urgent care [facility]   Summary of Call: tried to reach patient due to he was in the urgent care yesterday due to right finger pain left message for patient to call back the office     Action Taken: Left Message for Patient    Follow-up #1  Details: spoke with patient states he broke his finger but is doing okay will call us back if he needs anything   Action: Phone call completed  By: Fonnie Jarvis [place] ~ January 04, 2018 8:30 AM

## 2020-09-08 NOTE — Teleconsult (Signed)
Phone Note -     Patient    Call back at Ph1 [phone]  Initial call taken by: Theodoro Kos,  May 29, 2018 11:58 AM  Actual Caller: Patient  Call For: Physician  Initial Details of Call:  pt gout is acting up asking if he can get something called into pharmacy or does he need to come to office?    Reason for Call: Acute Illness    Follow-up #1  Details: med sent   By: [name] MD ~ May 29, 2018 12:26 PM    Action: Phone Call Completed, Patient Notified  By: Theodoro Kos ~ May 29, 2018 1:52 PM      Medications:  COLCHICINE 0.6 MG ORAL CAPSULE (COLCHICINE) 2 tabs by mouth at onset of gout, then 1 an hour later  #9[Capsule] x 3   Route:ORAL   Entered and Authorized by: [name] MD   Signed by: [name] MD on 05/29/2018   Method used: Electronically to      [id number]     [address]     [place], [place]  [address]     Ph: [phone] or [phone]     Fax: [phone]   Note to Pharmacy: Route: ORAL;    RxID: [id number]        Medications:  Added new medication of COLCHICINE 0.6 MG ORAL CAPSULE (COLCHICINE) 2 tabs by mouth at onset of gout, then 1 an hour later; Route: ORAL - Signed  Rx of COLCHICINE 0.6 MG ORAL CAPSULE (COLCHICINE) 2 tabs by mouth at onset of gout, then 1 an hour later; Route: ORAL  #9[Capsule] x 3;  Signed;  Entered by: [name] MD;  Authorized by: [name] MD;  Method used: [facility] to [id number] [address] [place], [place]  [address], Ph: [phone] or [phone], Fax: [phone]; Note to Pharmacy: Route: ORAL;

## 2020-09-08 NOTE — Progress Notes (Signed)
Luz Brazen, MD   [address]   [place], [place] [address]  Office: [phone] Fax: [phone]    January 18, 2016      Timothy Morris  [address]  [place], [place] [address]    Dear Timothy Morris,    I have received the results of your most recent labwork. The results are listed below:     Labs Your Value Normal Result Date   Total Cholesterol: 192 Goal: less than 200 01/15/2016   HDL (good cholesterol):  25 Normal male: 84 - 55   Normal male: 46 - 65 01/15/2016   LDL (bad cholesterol):  101 Goal: less than 130 01/15/2016   Triglycerides: 328 Goal: less than 200 01/15/2016   LDL-direct (bad cholesterol): 121 Goal: less than 130 09/25/2013   Cardiac CRP (helps predict risk of heart disease) 2.7 mg/L 0-1 = low risk  1-3 = average risk  >3 = high risk 01/20/2010   Hemoglobin A1C   (3 month sugar test) 5.1 Normal: 4.2  5.8 01/15/2016   Estimated Average Glucose  (3 month Average) 100 Goal: less than 150 01/15/2016   Urine microalbumin     Normal: < 20       Blood sugar 105 Normal Newborn to 10yr: 60 - 110   Normal 71yr and older: 70-100 01/15/2016   Creatinine (kidney function) 1.1 Normal: 0.4  1.2   01/15/2016   ALT (liver test) 72 Normal male: 0  40   Normal male: 0 - 31 01/15/2016   AST (liver test) 38 Normal male: 0  37   Normal male: 0 - 31 01/15/2016   Hematocrit   (blood count) 44.3 Normal male: 15  52   Normal male: 35- 47 01/15/2016   Vitamin D Level 30 Normal: 30 or more 01/20/2010   TSH (thyroid test)  Normal: 0.27  4.20    TSH (ultra thyroid test) 0.94 Normal: 0.27  4.20 01/15/2016   PSA (prostate test)  Normal: less than 4.0    Uric Acid (high in people with gout) 5.1 Normal male: 3.4 - 7.0   Normal male: 2.4 - 6.0 01/15/2016     Overall stable.  Slight elevation of Triglyceries and liver tests are consistent with some degree of fatty liver disorder.  Testosterone level is fine.  Weight loss and exercise will ameliorate,          Sincerely,        Luz Brazen MD

## 2020-09-08 NOTE — Teleconsult (Signed)
Phone Note -       Initial call taken by: [name] MD,  February 04, 2017 7:54 AM  Initial Details of Call:  call and let this patient know all of the recent blood tests were normal  and the back xray showed only milder arthritis         Follow-up #1  Action: Phone call completed, Patient Notified  By: Fonnie Jarvis [place] ~ February 06, 2017 1:27 PM

## 2020-09-17 ENCOUNTER — Encounter (HOSPITAL_BASED_OUTPATIENT_CLINIC_OR_DEPARTMENT_OTHER): Admitting: Pulmonary Disease

## 2020-09-28 ENCOUNTER — Encounter (HOSPITAL_BASED_OUTPATIENT_CLINIC_OR_DEPARTMENT_OTHER)

## 2020-09-30 ENCOUNTER — Encounter

## 2020-09-30 ENCOUNTER — Ambulatory Visit: Payer: PRIVATE HEALTH INSURANCE | Primary: Internal Medicine

## 2020-09-30 ENCOUNTER — Encounter (HOSPITAL_BASED_OUTPATIENT_CLINIC_OR_DEPARTMENT_OTHER): Admitting: Pulmonary Disease

## 2020-10-02 ENCOUNTER — Other Ambulatory Visit (INDEPENDENT_AMBULATORY_CARE_PROVIDER_SITE_OTHER)

## 2020-10-02 MED ORDER — metoprolol succinate XL (Toprol-XL) 50 mg 24 hr tablet
50 | ORAL_TABLET | Freq: Every day | ORAL | 1 refills | Status: DC
Start: 2020-10-02 — End: 2021-03-30

## 2020-10-12 ENCOUNTER — Ambulatory Visit: Payer: PRIVATE HEALTH INSURANCE | Primary: Internal Medicine

## 2020-10-14 ENCOUNTER — Other Ambulatory Visit (INDEPENDENT_AMBULATORY_CARE_PROVIDER_SITE_OTHER)

## 2020-10-14 ENCOUNTER — Ambulatory Visit: Payer: PRIVATE HEALTH INSURANCE | Primary: Internal Medicine

## 2020-10-14 MED ORDER — amLODIPine-benazepriL (Lotrel) 10-40 mg capsule
10-40 | Freq: Every day | ORAL | 2 refills | 60.00000 days | Status: DC
Start: 2020-10-14 — End: 2021-07-26

## 2020-10-21 IMAGING — MR COLUNA [HOSPITAL]^COLUNA CERVICAL
7 of 11 series · 28 of 48 positions shown · non-contrast
Comparison: none

[Series 1: localizador haste · axial · 12.0mm · 0.62mm/px · z∈[-27,+160]mm · 2 of 13 slices shown]
[im 1/13]
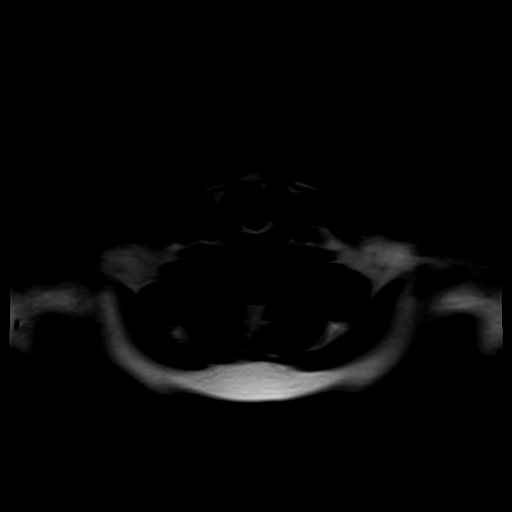
[im 13/13]
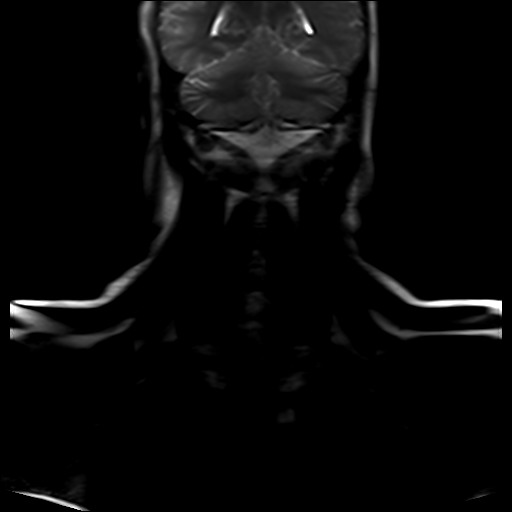

[Series 2: sag_t2_tse_p2 · sagittal · 3.0mm · 0.50mm/px · 2 of 12 slices shown]
[im 1/12]
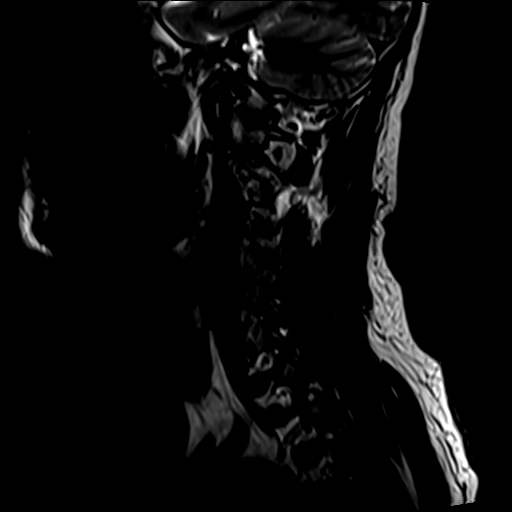
[im 12/12]
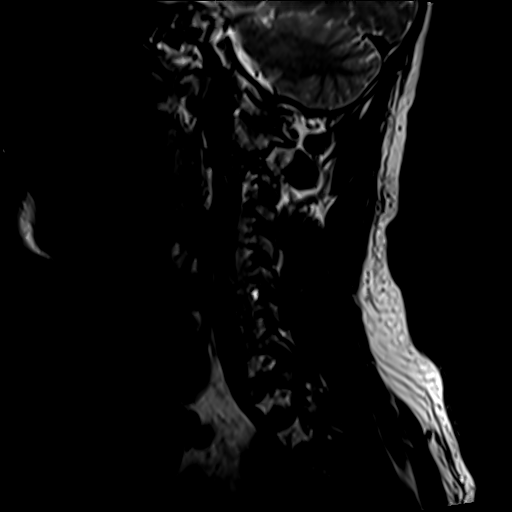

[Series 3: sag_t1_tse · sagittal · 3.0mm · 0.50mm/px · 3 of 12 slices shown]
[im 1/12]
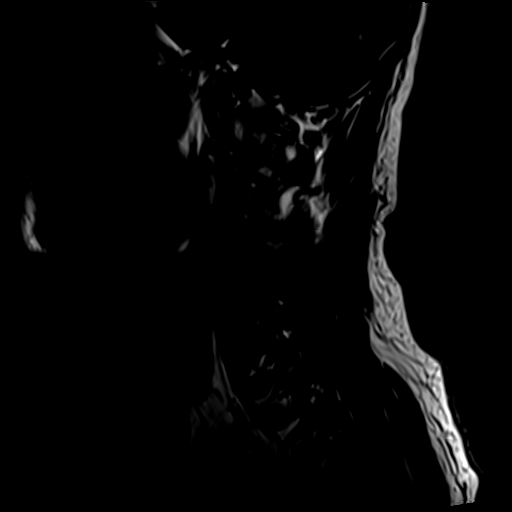
[im 6/12]
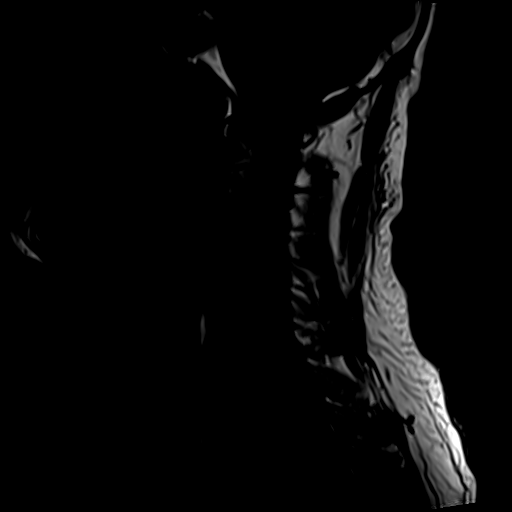
[im 12/12]
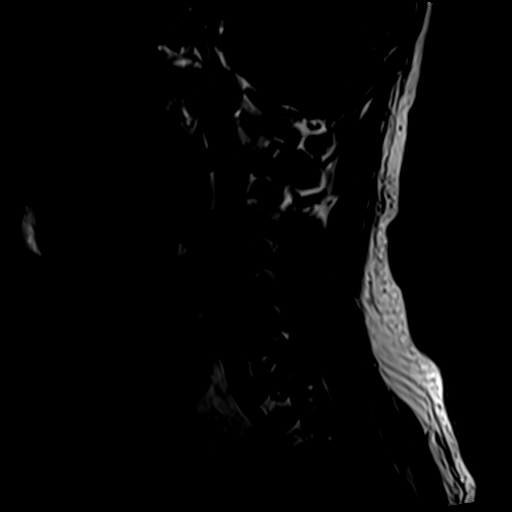

[Series 4: STIR · sagittal · 3.0mm · 0.50mm/px · 3 of 12 slices shown]
[im 1/12]
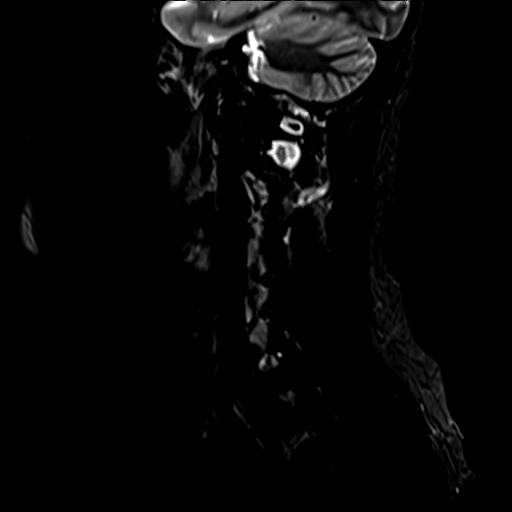
[im 6/12]
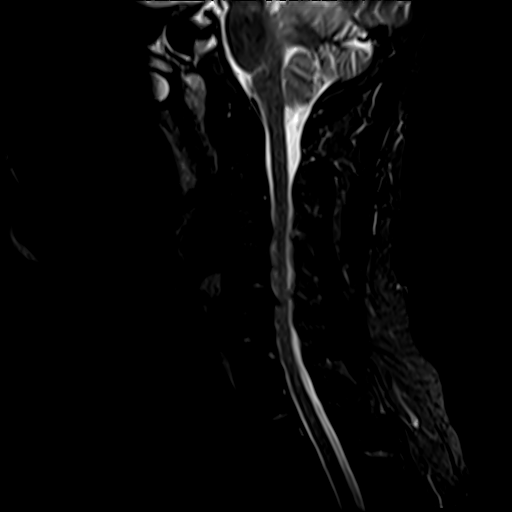
[im 12/12]
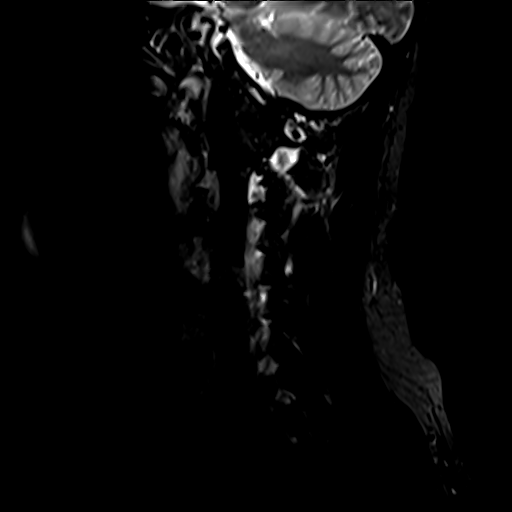

[Series 5: T2 · axial · 3.0mm · 0.74mm/px · z∈[-69,+35]mm · 7 of 28 slices shown]
[im 1/28]
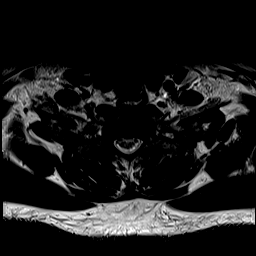
[im 5/28]
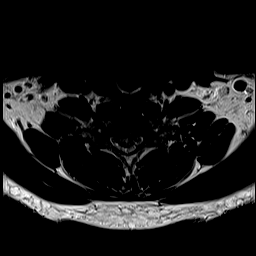
[im 10/28]
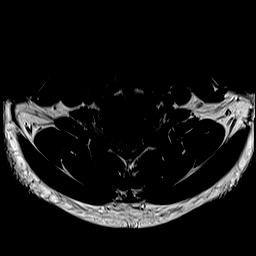
[im 14/28]
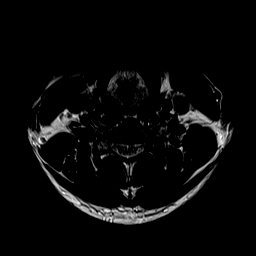
[im 19/28]
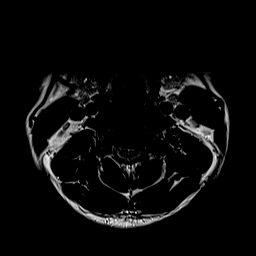
[im 23/28]
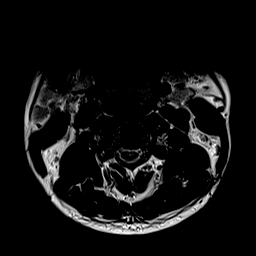
[im 28/28]
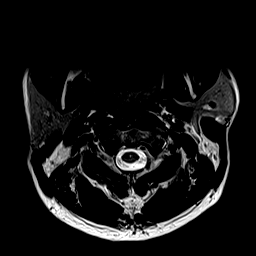

[Series 6: GRE · axial · 3.0mm · 0.62mm/px · z∈[-70,+34]mm · 7 of 28 slices shown]
[im 1/28]
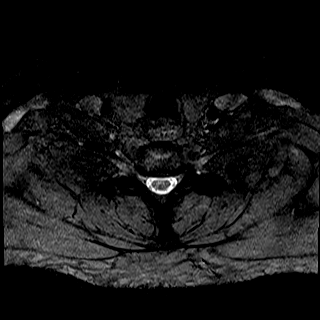
[im 5/28]
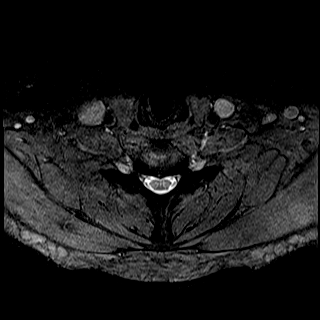
[im 10/28]
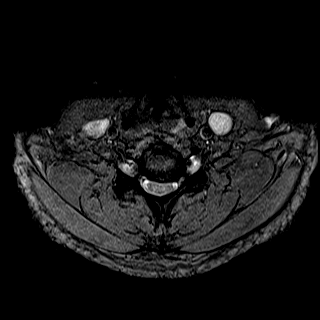
[im 14/28]
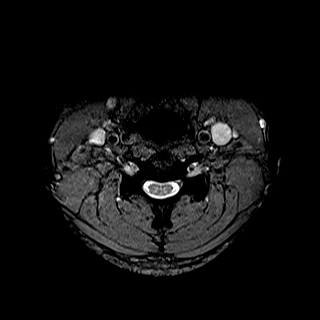
[im 19/28]
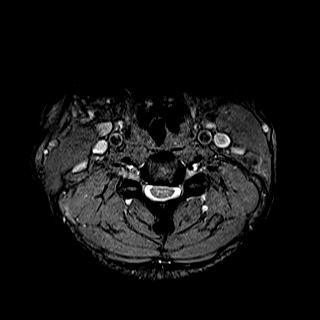
[im 23/28]
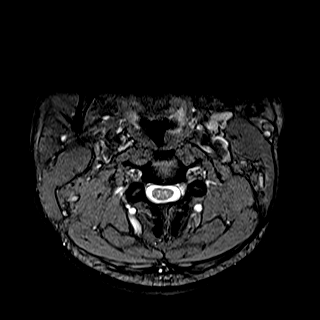
[im 28/28]
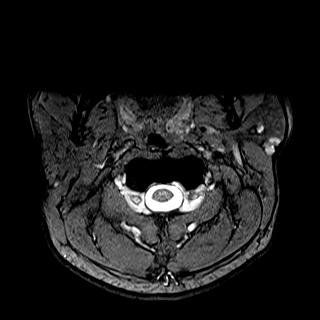

[Series 9: sagital_t2_quiet · sagittal · 4.0mm · 0.55mm/px · 4 of 16 slices shown]
[im 1/16]
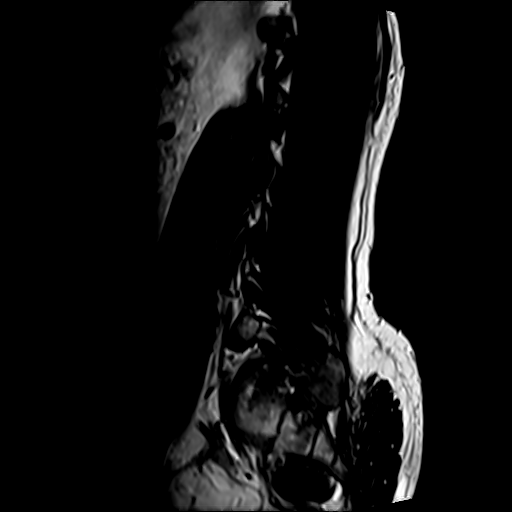
[im 6/16]
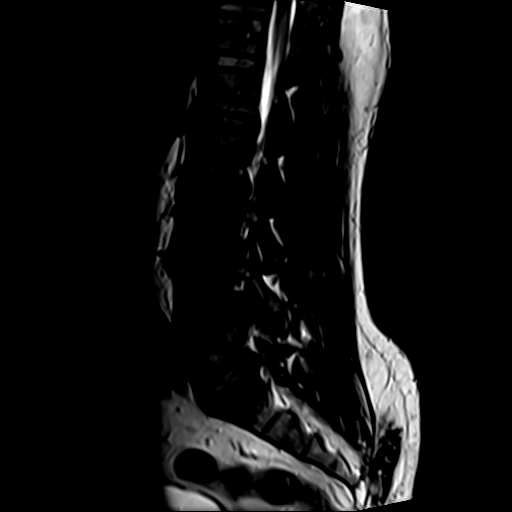
[im 11/16]
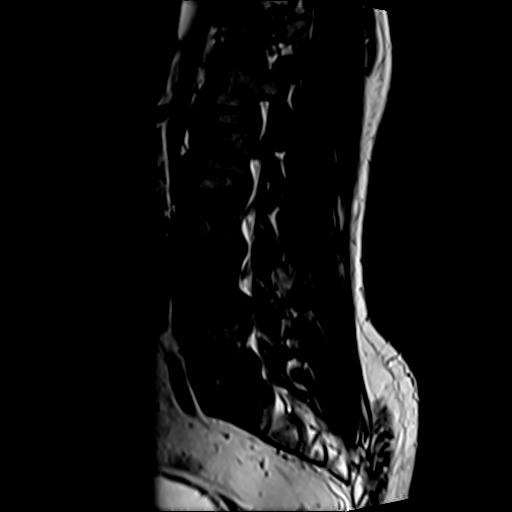
[im 16/16]
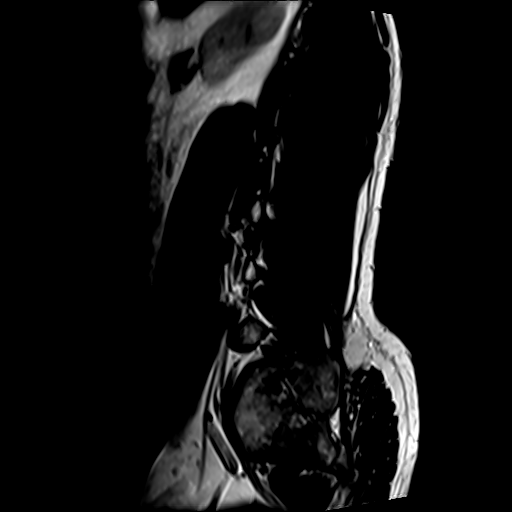

[28 of 48 positions shown; findings below may reference images not displayed]

Exame realizado em aparelho Magneton Essenza ([employer]) 1.5 Tesla com  sequências axiais
ponderadas em T2 e seqüências sagitais ponderadas em T1, T2, STIR.

Os seguintes aspectos foram observados:

- Retificação com ligeira inversão da curvatura fisiológica na posição de estudo e obliteração da
coluna liquórica anterior entre C3-C4, C5-C6 e C6-C7. 
RESSONÂNCIA MAGNÉTICA DA COLUNA CERVICAL
- Corpos vertebrais com morfologia preservada, apresentando sinais de espondilose, com
osteófitos e discreta anomalia de sinal nas margens anteriores de alguns platôs vertebrais.
- Redução da intensidade do sinal em T2 dos discos intervertebrais, inferindo desidratação
consequente a degeneração dos mesmos.
- Entre C3-C4, C4-C5, C5-C6 e C6-C7, observa-se complexos disco-osteofíticos postero-centrais
e postero-laterais de predomínio à esquerda, que comprime o saco dural e indenta a medula
ventralmente e que associados a uncoartrose e à artrose interapofisária, reduzem a
amplitude neuro-foraminal de maneira mais significativa do lado esquerdo.
- O canal vertebral ósseo, é de dimensões normais por toda a extensão estudada.
- Transição crânio-vertebral sem alterações.
- A medula cervical apresenta espessura, morfologia e intensidade de sinal preservados. 
- Musculatura paravertebral com morfologia e sinal preservados. 

Obs.: Pequena formação de aspecto cístico na base da língua à esquerda.

## 2020-10-26 ENCOUNTER — Institutional Professional Consult (permissible substitution): Admit: 2020-10-26 | Payer: PRIVATE HEALTH INSURANCE | Primary: Internal Medicine

## 2020-10-26 DIAGNOSIS — U071 COVID-19: Secondary | ICD-10-CM

## 2020-10-26 LAB — SARS COV 2 RNA BY PCR: COVID-19 (SARS-CoV-2 PCR): NOT DETECTED

## 2020-10-28 ENCOUNTER — Other Ambulatory Visit

## 2020-10-28 ENCOUNTER — Encounter

## 2020-10-28 ENCOUNTER — Ambulatory Visit: Admit: 2020-10-29 | Payer: PRIVATE HEALTH INSURANCE | Attending: Pulmonary Disease | Primary: Internal Medicine

## 2020-10-28 DIAGNOSIS — G4733 Obstructive sleep apnea (adult) (pediatric): Secondary | ICD-10-CM

## 2020-10-28 NOTE — Progress Notes (Signed)
Patient showed for sleep study

## 2020-11-13 ENCOUNTER — Encounter (INDEPENDENT_AMBULATORY_CARE_PROVIDER_SITE_OTHER): Admitting: Internal Medicine

## 2020-11-13 DIAGNOSIS — Z0000 Encounter for general adult medical examination without abnormal findings: Secondary | ICD-10-CM

## 2020-11-16 ENCOUNTER — Ambulatory Visit: Attending: Internal Medicine | Admitting: Internal Medicine

## 2020-11-16 ENCOUNTER — Ambulatory Visit
Admit: 2020-11-16 | Discharge: 2020-11-16 | Payer: PRIVATE HEALTH INSURANCE | Attending: Internal Medicine | Primary: Internal Medicine

## 2020-11-16 ENCOUNTER — Encounter (INDEPENDENT_AMBULATORY_CARE_PROVIDER_SITE_OTHER): Admitting: Internal Medicine

## 2020-11-16 ENCOUNTER — Encounter (INDEPENDENT_AMBULATORY_CARE_PROVIDER_SITE_OTHER): Admitting: Pediatrics

## 2020-11-16 ENCOUNTER — Other Ambulatory Visit

## 2020-11-16 VITALS — BP 138/84 | HR 59 | Wt 228.0 lb

## 2020-11-16 DIAGNOSIS — Z0000 Encounter for general adult medical examination without abnormal findings: Secondary | ICD-10-CM

## 2020-11-16 LAB — RAINBOW DRAW SST GOLD TOP

## 2020-11-16 LAB — CBC
Hematocrit: 44.4 % (ref 37.0–53.0)
Hemoglobin: 15.2 g/dL (ref 13.0–17.5)
MCH: 33 pg (ref 26.0–34.0)
MCHC: 34.2 g/dL (ref 31.0–37.0)
MCV: 96.5 fL (ref 80.0–100.0)
MPV: 12.5 fL — ABNORMAL HIGH (ref 9.1–12.4)
NRBC %: 0 % (ref 0.0–0.0)
NRBC Absolute: 0 10*3/uL (ref 0.00–2.00)
Platelets: 173 10*3/uL (ref 150–400)
RBC: 4.6 M/uL (ref 4.20–5.90)
RDW-CV: 13.1 % (ref 11.5–14.5)
RDW-SD: 46.5 fL (ref 35.0–51.0)
WBC: 5.9 10*3/uL (ref 4.0–11.0)

## 2020-11-16 LAB — BASIC METABOLIC PANEL
Anion Gap: 8 mmol/L (ref 3–14)
BUN: 18 mg/dL (ref 6–24)
CO2 (Bicarbonate): 26 mmol/L (ref 20–32)
Calcium: 9.6 mg/dL (ref 8.5–10.5)
Chloride: 106 mmol/L (ref 98–110)
Creatinine: 1.1 mg/dL (ref 0.55–1.30)
Glucose: 102 mg/dL (ref 70–139)
Potassium: 4.4 mmol/L (ref 3.6–5.2)
Sodium: 140 mmol/L (ref 135–146)
eGFRcr: 81 mL/min/{1.73_m2} (ref >=60)

## 2020-11-16 LAB — PSA TOTAL, SCREEN: PSA: 0.58 ng/mL (ref 0.00–4.00)

## 2020-11-16 LAB — LIPID PANEL
Cholesterol: 110 mg/dL (ref <=200)
HDL cholesterol: 32 mg/dL — ABNORMAL LOW (ref >=40)
LDL cholesterol, calculated: 36 mg/dL (ref 0–130)
Triglycerides: 210 mg/dL — ABNORMAL HIGH (ref <=150)

## 2020-11-16 LAB — HEPATIC FUNCTION PANEL
ALT: 64 U/L — ABNORMAL HIGH (ref 0–55)
AST: 35 U/L (ref 6–42)
Albumin: 4.1 g/dL (ref 3.2–5.0)
Alkaline phosphatase: 62 U/L (ref 30–130)
Bilirubin, direct: 0.1 mg/dL (ref 0.0–0.5)
Bilirubin, total: 0.4 mg/dL (ref 0.2–1.2)
Protein, total: 7.9 g/dL (ref 6.0–8.4)

## 2020-11-16 NOTE — Addendum Note (Signed)
Addended by: Vevelyn Francois on: 11/16/2020 05:56 PM     Modules accepted: Orders

## 2020-11-16 NOTE — Assessment & Plan Note (Signed)
Patient histories, medications, diagnoses, providers, as well as functional status, mental status,HRA, educational needs and barriers reviewed.Follow up in one year for annual wellness exam  Patient will follow up for acute, ongoing and chronic issues as scheduled and needed.

## 2020-11-16 NOTE — Assessment & Plan Note (Signed)
Patient taking statin, no signs or symptoms of atherosclerotic peripheral vascular disease.  No change in medications made.  Lipid profile pending

## 2020-11-16 NOTE — Assessment & Plan Note (Signed)
Today's blood pressure is within goal limit per [facility] guidelines.  Compliant with his medications.    Encouraged to maintain salt discipline and strove to lose a  Bit more weight.  Encouraged to check home blood pressures using the American Heart Association handout which is provided .

## 2020-11-16 NOTE — Progress Notes (Signed)
[facility] FAMILY [facility]ICINE [address]  [facility] FAMILY MEDICINE [address] MAIN  [address]  [place] [address]  Dept: [phone]  Dept Fax: [phone]     Patient ID: Timothy Morris is a 50 y.o. male who presents for Annual Exam.    Subjective   Timothy Morris is here for his annual physical.  He has a past medical history listed below.  His main concern overall is his ongoing low back pain which prevents him from being able to stand in any position for more than 6 hours without significant pain.  He has been seeing pain management and been receiving facet injections with only limited to no benefit.  He was finally approved by his insurance to have an ablation procedure done.  And that is scheduled for this week.    He is also complaining of issues related to his sore throat and postnasal drip?Marland Kitchen  Seeing GI and nose and throat in the last year trying to rectify this problem although he has had quite an extensive work-up there is no clear etiology for his distressing symptoms.  He does take a PPI on a regular basis.      Past Medical History:   Diagnosis Date   ? Erectile dysfunction    ? Fatty liver    ? History of acute pancreatitis    ? History of gout    ? Primary hypertension      Past Surgical History:   Procedure Laterality Date   ? INGUINAL HERNIA REPAIR     ? KNEE ARTHROSCOPY W/ DEBRIDEMENT Right     x2   ? LEFT COLECTOMY      Sigmoid   ? ROTATOR CUFF REPAIR Right      Family History   Problem Relation Name Age of Onset   ? Breast cancer Mother     ? No Known Problems Sister       Social History     Tobacco Use   ? Smoking status: Never Smoker   ? Smokeless tobacco: Never Used   Substance Use Topics   ? Alcohol use: Never   ? Drug use: Never     Review of Systems   Constitutional: Negative.         Caffeine fat burning patches to lose weight   HENT: Negative.         Continually issues with throat  Wakes in the AM with "hacking" up phlegm  Has been to GI and ENT without specific etiology.    PPI has not helpful with throat  issues     Respiratory: Negative.    Cardiovascular: Negative.    Gastrointestinal: Negative.    Musculoskeletal: Positive for arthralgias and back pain.        Seeing back specialist   Pending nerve ablation next week    Neurological: Negative.         No leg weakness   Psychiatric/Behavioral: Negative.        Objective   Visit Vitals  BP 138/84 (BP Location: Left arm, Patient Position: Sitting, BP Cuff Size: Adult)   Pulse 59   Wt 103 kg   SpO2 97%   BMI 32.25 kg/m?   BSA 2.26 m?       Physical Exam  Vitals and nursing note reviewed.   HENT:      Head: Normocephalic.      Right Ear: Tympanic membrane normal.      Left Ear: Tympanic membrane normal.  Nose: Nose normal.      Mouth/Throat:      Pharynx: Oropharynx is clear. No oropharyngeal exudate.   Cardiovascular:      Rate and Rhythm: Normal rate and regular rhythm.      Heart sounds: No murmur heard.  Pulmonary:      Effort: Pulmonary effort is normal.      Breath sounds: Normal breath sounds.   Abdominal:      General: Abdomen is flat.      Palpations: Abdomen is soft. There is no mass.   Genitourinary:     Penis: Normal.       Testes: Normal.      Comments: No Inguinal Hernia felt   Musculoskeletal:         General: Normal range of motion.      Cervical back: Normal range of motion.   Skin:     General: Skin is warm and dry.   Neurological:      General: No focal deficit present.      Mental Status: He is alert.   Psychiatric:         Mood and Affect: Mood normal.         Assessment/Plan   Orlyn was seen today for annual exam.  Preventative health care  Assessment & Plan:  Patient histories, medications, diagnoses, providers, as well as functional status, mental status,HRA, educational needs and barriers reviewed.Follow up in one year for annual wellness exam  Patient will follow up for acute, ongoing and chronic issues as scheduled and needed.      Orders:  -     CBC; Future  -     Basic metabolic panel; Future  -     Lipid panel; Future  -     Hepatic  function panel; Future  -     PSA, screen; Future  -     Venipuncture Need Phys Skill, Dx or Rx  Mixed hyperlipidemia  Assessment & Plan:  Patient taking statin, no signs or symptoms of atherosclerotic peripheral vascular disease.  No change in medications made.  Lipid profile pending  Orders:  -     CBC; Future  -     Basic metabolic panel; Future  -     Lipid panel; Future  -     Hepatic function panel; Future  -     PSA, screen; Future  -     Venipuncture Need Phys Skill, Dx or Rx  Primary hypertension  Assessment & Plan:  Today's blood pressure is within goal limit per [facility] guidelines.  Compliant with his medications.    Encouraged to maintain salt discipline and strove to lose a  Bit more weight.  Encouraged to check home blood pressures using the American Heart Association handout which is provided .     Orders:  -     CBC; Future  -     Basic metabolic panel; Future  -     Lipid panel; Future  -     Hepatic function panel; Future  -     PSA, screen; Future  -     Venipuncture Need Phys Skill, Dx or Rx  Fatty (change of) liver, not elsewhere classified  Assessment & Plan:  Patient has been avoiding liver irritants.  He has lost weight through a recently started diet program.  He is reminded to avoid alcohol.  Liver enzymes checked today  Orders:  -     CBC; Future  -  Basic metabolic panel; Future  -     Lipid panel; Future  -     Hepatic function panel; Future  -     PSA, screen; Future  -     Venipuncture Need Phys Skill, Dx or Rx  Other fatigue  -     Testosterone, Total LC/MS/MS; Future

## 2020-11-16 NOTE — Patient Instructions (Signed)
Follow Up in no more than 1 year  Limit Salt intake to 3 grams daily  Exercise at aerobic capacity 30 minutes daily   A healthy Body Mass Index is 26 or lower  Follow a Mediterranean Diet Plan or use learnhealthyfood.org

## 2020-11-16 NOTE — Assessment & Plan Note (Signed)
Patient has been avoiding liver irritants.  He has lost weight through a recently started diet program.  He is reminded to avoid alcohol.  Liver enzymes checked today

## 2020-11-17 ENCOUNTER — Telehealth (INDEPENDENT_AMBULATORY_CARE_PROVIDER_SITE_OTHER): Admitting: Internal Medicine

## 2020-11-17 NOTE — Telephone Encounter (Signed)
-----   Message from [name], MD sent at 11/17/2020  8:14 AM EDT -----  Call and let the patient know all of the labs were normal except mild abnormalities of liver enzymes and lipids  Both will improve with weight loss.  Can repeat in 6-12 months.

## 2020-11-17 NOTE — Telephone Encounter (Signed)
Pt notified

## 2020-11-17 NOTE — Telephone Encounter (Signed)
LEFT MESSAGE

## 2020-12-03 ENCOUNTER — Encounter (INDEPENDENT_AMBULATORY_CARE_PROVIDER_SITE_OTHER): Admitting: Internal Medicine

## 2020-12-04 ENCOUNTER — Other Ambulatory Visit

## 2020-12-04 ENCOUNTER — Ambulatory Visit
Admit: 2020-12-04 | Discharge: 2020-12-04 | Payer: PRIVATE HEALTH INSURANCE | Attending: Pulmonary Disease | Primary: Internal Medicine

## 2020-12-04 ENCOUNTER — Encounter (HOSPITAL_BASED_OUTPATIENT_CLINIC_OR_DEPARTMENT_OTHER): Admitting: Pulmonary Disease

## 2020-12-04 VITALS — BP 136/73 | HR 62 | Resp 16 | Ht 70.0 in | Wt 228.4 lb

## 2020-12-04 DIAGNOSIS — G4733 Obstructive sleep apnea (adult) (pediatric): Secondary | ICD-10-CM

## 2020-12-04 NOTE — Progress Notes (Signed)
Excessive daytime sleepiness  NO  Wake up feeling tired  SOMETIMES  Morning headaches  NO  Wake up gasping, chocking or dry mouth  DRY MOUTH   Frequent urination at night  AT LEAST ONCE  Nocturnal heartburn  NO  Change in mood  NO  Weight gain recently  NO  Bed partner? Any noticable snoring or apnea  NO

## 2020-12-04 NOTE — Progress Notes (Signed)
[facility]  [facility]  [address]  [place] [address]  Dept: [phone]     Patient ID: Timothy Morris is a 50 y.o. male who presents for follow-up of his sleep study.    Subjective   HPI    Timothy Morris is a 50 year old male who is here to discuss his overnight sleep study.    Timothy Morris says his main issue remains to be extreme dryness of his mouth as well as dryness of his eyes.  He has history of snoring    Feels tired during the day but he says he does not feel that is his main issue.  Regarding his weight, reports no significant changes.    Patient Active Problem List   Diagnosis   ? Hypertension   ? Hyperlipidemia   ? Fatty (change of) liver, not elsewhere classified   ? Erectile dysfunction due to diseases classified elsewhere   ? Preventative health care     Current Outpatient Medications   Medication Instructions   ? amLODIPine-benazepriL (Lotrel) 10-40 mg capsule 1 capsule, oral, Daily   ? atorvastatin (Lipitor) 40 mg tablet No dose, route, or frequency recorded.   ? famotidine-Ca carb-mag hydrox (Pepcid Complete) 10-800-165 mg chewable tablet oral   ? loratadine-pseudoephedrine (Claritin-D 12-hour) 5-120 mg 12 hr tablet 1 tablet, oral, 2 times daily, Do not crush, chew, or split.    ? metoprolol succinate XL (TOPROL-XL) 50 mg, oral, Daily   ? sildenafil (Viagra) 100 mg tablet No dose, route, or frequency recorded.     No Known Allergies  Past Medical History:   Diagnosis Date   ? Erectile dysfunction    ? Fatty liver    ? History of acute pancreatitis    ? History of gout    ? Primary hypertension      Past Surgical History:   Procedure Laterality Date   ? INGUINAL HERNIA REPAIR     ? KNEE ARTHROSCOPY W/ DEBRIDEMENT Right     x2   ? LEFT COLECTOMY      Sigmoid   ? ROTATOR CUFF REPAIR Right        Objective   Visit Vitals  BP 136/73   Pulse 62   Resp 16   Ht 1.778 m   Wt 104 kg   BMI 32.77 kg/m?   BSA 2.27 m?     Review of system:    As per HPI.   Rest of the systems were reviewed.    Physical Exam    Pleasant male, comfortable at rest.  Exam was essentially unchanged.  He does say he is very frustrated as he has extreme dry mouth and no diagnosis has been made.    Data: Overnight sleep study, suggestive of mild obstructive sleep apnea, apnea hypopnea index was 8.01.  Moderate to loud snoring was recorded.  The lowest recorded oxygen saturation was 86%.    Assessment/Plan     1.  Mild obstructive sleep apnea:    I reviewed overnight polysomnogram results with Timothy Morris.  Discussed the complications of untreated obstructive sleep apnea, mainly consisting of excessive daytime sleepiness, hypertension and arrhythmias etc.    Discussed various treatment options, CPAP, dental appliance.    Timothy Morris is willing to pursue CPAP, arrangements were made for CPAP set up, he was advised to use CPAP every night.    2.  History of hypertension: Patient's with underlying untreated obstructive sleep apnea may have a higher incidence of  hypertension.  This was discussed with Timothy Morris, he was advised to continue with current medications and pursue sleep apnea treatment.    3.  Obesity: Obese patients may have a higher incidence of obstructive sleep apnea, this was discussed with Timothy Morris.  He was advised to monitor his portion size and increase activity as tolerated.    4. ?  Dry mouth, dry eyes: ?  Sjogren's syndrome: I have discussed this with Timothy Morris during his previous visit also.  Advised to consult rheumatologist for further evaluation.    Follow-up: 3 months or sooner if need be.

## 2020-12-07 ENCOUNTER — Telehealth (HOSPITAL_BASED_OUTPATIENT_CLINIC_OR_DEPARTMENT_OTHER): Admitting: Pulmonary Disease

## 2020-12-07 NOTE — Telephone Encounter (Signed)
-----   Message from Clearence Cheek, MD sent at 12/04/2020  2:31 PM EDT -----  CPAP set up orders sent

## 2020-12-07 NOTE — Telephone Encounter (Signed)
APAP setup RX, SS & ofc notes sent to [facility] via [employer]

## 2021-01-06 ENCOUNTER — Other Ambulatory Visit (INDEPENDENT_AMBULATORY_CARE_PROVIDER_SITE_OTHER): Admitting: Internal Medicine

## 2021-01-06 NOTE — Telephone Encounter (Signed)
 Appointment:        Date of patient's next encounter in the current department:  Visit date not found   Date of patient's next encounter with the current provider:  Visit date not found   Date of patient's last encounter in the current department: 11/17/2020     Last BP:   BP Readings from Last 3 Encounters:   12/04/20 136/73   11/16/20 138/84   10/28/20 114/58       Last HGBA1C: No results found for: HGBA1C    Labs  No results found for: TSH  Lab Results   Component Value Date    CALCIUM 9.6 11/16/2020    ALBUMIN 4.1 11/16/2020    NA 140 11/16/2020    K 4.4 11/16/2020    CO2 26 11/16/2020    CL 106 11/16/2020    BUN 18 11/16/2020    CREATININE 1.10 11/16/2020      AST   Date Value Ref Range Status   11/16/2020 35 6 - 42 U/L Final     ALT   Date Value Ref Range Status   11/16/2020 64 (H) 0 - 55 U/L Final     No components found for: TBILI  Hemoglobin   Date Value Ref Range Status   11/16/2020 15.2 13.0 - 17.5 g/dL Final     Hematocrit   Date Value Ref Range Status   11/16/2020 44.4 37.0 - 53.0 % Final     Platelets   Date Value Ref Range Status   11/16/2020 173 150 - 400 K/uL Final     No results found for: PTT  No results found for: INR  No results found for: PROTIME  No results found for: PTADJUSTED    Last Urine Drug Screen: on .  Narcotic Medications:    Directives/Controlled Substance Agreement   PMP Appropriate __YES      ___No

## 2021-01-26 ENCOUNTER — Telehealth (HOSPITAL_BASED_OUTPATIENT_CLINIC_OR_DEPARTMENT_OTHER): Admitting: Pulmonary Disease

## 2021-01-26 NOTE — Telephone Encounter (Signed)
 Pt refused CPAP & cancelled order w/DME provider.

## 2021-03-22 ENCOUNTER — Encounter

## 2021-03-22 LAB — UNMAPPED LAB RESULTS

## 2021-03-22 NOTE — ED Provider Notes (Signed)
 Note Type   : ED Prov Note                      Date: 03/22/2021 15:43  MRN Pt-Name : [id number] [name]                                Class: IP     Birth-Date  : 1970-12-22                                                Sex: M  Location    : [facility] B350 A                                   Visit#: [id number]  Admit Date  :                                 Disch Date:   ------------------------------------------------------------------------------     Date of service: 03/22/2021                                                                       EMERGENCY  DEPARTMENT [facility] TRIAGE NOTE    ED Triage Notes     Corey Skains, RN 03/22/2021  9:02 AM            Pt presents to ED with C/O abdominal pain since Saturday. +N/V. -Urinary  changes. Pt reports emesis x4 last night. Diffuse abdominal pain in all 4  quadrants. Hx of appendicitis states "I know what it is, it is appendicitis it  feels the same as 2 years ago when I had it."    Triage Notes Reviewed by Provider: Renato Shin, MD 10/10/229:34 AM  Previous Medical Records Reviewed: yes  History Obtained From: patient    CHIEF COMPLAINT    Chief Complaint  Patient presents with   Abdominal Pain      HPI    Onset: days  Severity: moderate  Timing: still present  Associated Symptoms: no fever      Timothy Morris is a 50 y.o. male who presents to the ED with abdominal pain.  Last  week he started to feel unwell, pain was worse starting two days ago.  Pain was  much worse yesterday.  He did have a few episodes of vomiting last night.  Some  chills. He tells me that had appendicitis two years and feels similar.  Reviewing the record, he was admitted in September 2020 for pancreatitis rather  than appendicitis.    REVIEW OF SYSTEMS    Review of Systems  Constitutional:  Negative for fever.  Respiratory:  Negative for shortness of breath.  Cardiovascular:  Negative for chest pain.  Gastrointestinal:  Positive for abdominal pain.  See HPI for further  details. All other systems reviewed and are negative.    PAST MEDICAL HISTORY  Past Medical History:  Diagnosis Date   Back pain   Diverticulitis   Gout   Hypertension      FAMILY HISTORY  No family history on file.    SOCIAL HISTORY  Social History    Socioeconomic History   Marital status: Divorced  Tobacco Use   Smoking status: Never   Smokeless tobacco: Never  Vaping Use   Vaping Use: Never used  Substance and Sexual Activity   Alcohol use: Never   Drug use: Never      SURGICAL HISTORY  Past Surgical History:  Procedure Laterality Date   SHOULDER ARTHROSCOPY Right 1998   EGD AND COLONOSCOPY N/A 02/14/2019   Surgeon: Twana First, MD PhD;  Location: [facility];  Service: Gastroenterology;  Laterality: N/A;   ABDOMINAL SURGERY   BOWEL RESECTION LARGE/COLON  2010 [facility]   resection for diverticulitis   COLON SURGERY   HERNIA REPAIR  2012 [facility]   KNEE ARTHROSCOPY Right      CURRENT MEDICATIONS  Current Discharge Medication List      CONTINUE these medications which have NOT CHANGED   Details  ALLOPURINOL ORAL Take by mouth.    amLODIPine (NORVASC) 10 MG tablet Take 1 tablet (10 mg total) by mouth daily.  Qty: 30 tablet, Refills: 0    carisoprodol (SOMA) 250 mg tablet Take 1 tablet (250 mg total) by mouth 3 times  a day.  Qty: 15 tablet, Refills: 0    cyclobenzaprine (FLEXERIL) 10 MG tablet Take 1 tablet (10 mg total) by mouth 3  times a day as needed for muscle spasms.  Qty: 30 tablet, Refills: 0    oxyCODONE-acetaminophen (PERCOCET) 5-325 mg per tablet Take 1 tablet by mouth  every 6 hours as needed for pain.  Qty: 15 tablet, Refills: 0   Comments: Upon patient request, the pharmacist may dispense less than the  recommended full quantity indicated.    UNKNOWN TO PATIENT Pt states being on another BP medication, unknown name at  this time          ALLERGIES  No Known Allergies    PHYSICAL EXAM  VITAL SIGNS:  ED Triage Vitals [03/22/21 0859]  Triage Vitals Group     BP (!) 189/96     Heart Rate 61     Resp 20     Temp 97.6  F (36.4 C)     SpO2 97 %     Weight     Height 1.803 m (5\' 11" )     0-10 Pain Score  10     Wong-Baker FACES Pain Rating    Constitutional: NAD.  Head: Atraumatic  Eyes: No scleral icterus.  ENT: Mucous membranes dry.  Cardiovascular: Regular rate and rhythm.  Lungs: CTAB.  Abdomen: Soft, nondistended.  Tender across upper abdomen, particularly in  epigastrium, though also somewhat tender in the right lower quadrant.  Nontender  left lower quadrant.  No guarding or rebound.  Skin: Warm and dry.  Extremities: No edema.  Neurologic: Speech fluent.    EKG/RADIOLOGY/LABS    EKG: Sinus, rate 60.  No ST elevations or depressions.    CT Abdomen And Pelvis W IV Contrast    Result Date: 03/22/2021  EXAM DESCRIPTION:  CT ABDOMEN AND PELVIS W CONTRAST CLINICAL HISTORY:  50 y/o  M  with right lower quadrant abdominal pain. COMPARISON:  Abdomen and pelvis CT  dated 02/10/2019.  MRCP dated 02/12/2019. TECHNIQUE:  - - Helical axial CT  imaging was performed through the abdomen and  pelvis with intravenous contrast.  2D reconstructions were performed. - MIPS Measure #359 "Standard nomenclature  was used for Dose Index registry submission."  MIPS Measure [phone] "Patient  Exposure to Ionizing Radiation was submitted to [employer] Dose[employer]ise Dose Index  Registry."  Measure #436 "Adaptive Iterative Dose Reduction (AIDR) and NEMA XR  25 DOSE check software, were used to reduce radiation dose to the patient."  FINDINGS:  - - Lower thorax:  Normal. - Liver:  Normal. - Biliary tree:  Normal  gallbladder.  No abnormal intrahepatic or extrahepatic ductal dilatation. -  Pancreas:  The pancreatic parenchyma is normal and the main pancreatic duct is  not dilated.  Question mild peripancreatic stranding at the head. - Spleen:  Normal. - Adrenals:  Normal. - Kidneys, ureters, and bladder:  No  hydronephrosis, nephrolithiasis, or mass.  The ureters and decompressed urinary  bladder are normal. - Pelvic organs:  Not well characterized by CT.   However,  the CT appearance of the prostate and seminal vesicles are unremarkable for age.  - Lymph nodes:  Few prominent periportal lymph nodes are similar to the prior  exam. - Vasculature:  Ectasia of the infrarenal aorta.  Aortic calcifications.  The major branches of the abdominal aorta are well opacified. - Hollow viscera:  No CT evidence of appendicitis (series 2 image 104). Minimal descending and  sigmoid diverticulosis coli. - Mesentery and omentum:  Otherwise unremarkable. -  Superficial soft tissues:  Small fat containing left inguinal hernia. - Bones:  Degenerative changes. IMPRESSION: 1. No CT evidence of appendicitis. 2. Minor  peripancreatic stranding at the pancreatic head.  This can represent a  pancreatitis.  Please correlate with a lipase. 3. Few incidentals as above.    Results for orders placed or performed during the hospital encounter of 03/22/21  (from the past 24 hour(s))  Basic Metabolic Panel  Result Value Ref Range   Sodium 134 134 - 144 mmol/L   Potassium 4.1 3.2 - 5.1 mmol/L   Chloride 102 97 - 109 mmol/L   Total CO2 20 20 - 32 mmol/L   Anion Gap 12 5 - 15 mmol/L   BUN 14 9 - 26 mg/dL   Creatinine, Blood 1.0 0.7 - 1.3 mg/dL   Glucose, Blood 245 (H) 70 - 110 mg/dL   Calcium 9.7 8.5 - 80.9 mg/dL   Estimated GFR(CKD-EPI) 86 >=60 mL/min/BSA  Hepatic Function Panel - LFT  Result Value Ref Range   Total Protein 8.5 (H) 6.1 - 8.2 g/dL   Albumin, Blood 4.3 3.4 - 5.0 g/dL   Total Bilirubin 0.7 0.2 - 1.2 mg/dL   Direct Bilirubin 0.3 0.0 - 0.5 mg/dL   Alkaline Phosphatase 65 40 - 150 IU/L   AST (SGOT) 26 15 - 37 IU/L   Globulin Result 4.2 2.5 - 4.3 g/dL   ALT (SGPT) 52 0 - 55 IU/L  Lipase  Result Value Ref Range   Lipase 109 (H) 8 - 78 IU/L  CBC and Differential  Result Value Ref Range   WBC 12.86 (H) 4.00 - 11.00 K/uL   RBC 5.20 4.10 - 5.60 M/uL   Hemoglobin 16.6 12.7 - 16.7 g/dL   Hematocrit 98.3 38.2 - 50.0 %   MCH 31.9 25.0 - 35.0 pg   MCHC 35.7 31.0 - 36.0 g/dL   MCV 89 82 - 98 fL   RDW 12.2  11.5 - 15.0 %   Platelet Count 226 150 - 450 K/uL   MPV 11.0 8.0 - 14.0 fL  Neutrophil 83.1 %   Lymphocyte 12.1 %   Monocyte 4.0 %   Eosinophil 0.1 %   Basophil 0.3 %   Immature Granulocyte (Meta, Myelo, Promyelocyte) 0.4 %   Absolute Neutrophil Count 10.68 (H) 1.50 - 7.70 K/uL   Absolute Lymphocyte Count 1.56 1.00 - 5.00 K/uL   Absolute Monocyte Count 0.52 0.10 - 1.00 K/uL   Absolute Eosinophil Count 0.01 0.00 - 0.70 K/uL   Absolute Basophil Count 0.04 0.00 - 0.20 K/uL   Absolute Immature Granulocyte (Meta, Myelo, Promyelocyte) 0.05 0.00 - 0.09 K/uL  Blue Top  Result Value Ref Range   Extra Tubes Received  Red Top  Result Value Ref Range   Extra Tubes Received  Mint Green Top  Result Value Ref Range   Extra Tubes Received  Gold Top  Result Value Ref Range   Extra Tubes Received  Urinalysis with Reflex to Urine Culture   Specimen: Urine, Mid-stream Collection  Result Value Ref Range   Color, Urine Yellow Amber, Yellow   Clarity, Urine Clear Clear   pH, Urine 6.0 5.0 - 9.0   Protein, Urine 30 mg/dL (A) Negative   Ketone, Urine Negative Negative   Glucose, Urine Negative Negative   Blood, Urine Trace (A) Negative   Leukocyte, Urine Negative Negative   Nitrite, Urine Negative Negative   Specific Gravity, Urine 1.019 1.005 - 1.035   Bilirubin, Urine Negative Negative   Urobilinogen, Urine <2.0 mg/dL <5.4 mg/dL   White Blood Cells, Urine <4 <=4 /hpf   Red Blood Cell,Urine 3-5 <=5 cells/HPF   Mucous Threads Present (A) None Seen   Squamous Epithelial Cells 0-5/HPF <=5/HPF /HPF  Coronavirus SARS-CoV-2, Molecular (Clearance Only)   Specimen: Nasopharyngeal Swab; Other  Result Value Ref Range   Coronavirus SARS-CoV-2 Not Detected Not Detected    ED COURSE/PROCEDURES/ASSESSMENT    Patient is a 50 year old man here for evaluation of the above symptoms.  He has  relatively diffuse abdominal tenderness.  CT scan shows findings consistent with  appendicitis, and his lipase is somewhat elevated.  This is similar to  previous  presentation.  He will be admitted for further evaluation and treatment.  Discussed with Lidia Collum, hospitalist.    Impression:  Clinical Impression  1. Acute pancreatitis without infection or necrosis, unspecified pancreatitis  type      Disposition:  ED Disposition     ED Disposition  Admit   Condition  --   Comment  Diagnosis: Pancreatitis, unspecified pancreatitis type [[id number]]   Admitting Physician: Ilda Mori [[id number]]   Attending Provider: Abran Duke [[id number]]   Bed recommendations: Medical Telemetry [15]   Anticipated length of stay (days): 5   Certification: I certify that inpatient services for a duration of greater than  2 midnights are medically necessary for this patient or are on the Inpatient  Only Procedure list. Please see H&P and MD Progress Notes for information on  this patient's course of treatment.   Current Hospital (If Applicable): [facility] [2100]                Renato Shin, MD  03/22/21 [phone]

## 2021-03-23 LAB — UNMAPPED LAB RESULTS: Phosphorous (EXT): 3.1 mg/dL (ref 2.4–4.8)

## 2021-03-23 LAB — TRIGLYCERIDES (EXT): Triglycerides (EXT): 159 mg/dL — ABNORMAL HIGH (ref <150)

## 2021-03-25 ENCOUNTER — Encounter

## 2021-03-25 NOTE — Discharge Summary (Signed)
 Discharge Summary      Patient: Timothy Morris  DOB: September 18, 1970  MRN#: [id number]  CSN: [id number]  Date Of Admission:  03/22/2021  Date Of Discharge: 03/25/21  Attending Physician:  No att. providers found  Service:  Hospital Medicine  Primary Care Physician: Karen Chafe, MD  Code Status: Full Code    Discharge diagnosis:  Discharge Diagnoses   Diagnosis POA   Pancreatitis Yes   Hypertension Unknown   Pancreatitis, unspecified pancreatitis type Yes    Resolved Diagnoses  No resolved problems to display.      Discharge Disposition:  Home    Things to follow up:  Blood culture  IgG4 level  Elevated blood pressure  GI follow up for EUS      Discharge Medications:    Medication List      CONTINUE taking these medications    amLODIPine-benazepril 10-40 mg per capsule  Commonly known as: LOTREL    atorvaSTATin 40 MG tablet  Commonly known as: LIPITOR    carisoprodoL 250 mg tablet  Commonly known as: SOMA  Take 1 tablet (250 mg total) by mouth 3 times a day.            Allergies:  Patient has no known allergies.    Presenting Complaint:  Abdominal pain    History Of Present Illness:  According to the admission H&P,    50 year old male with a past medical history of diverticulitis status post  partial colectomy 10 years ago, gout, hypertension and family history of  pancreatic cancer.  Presents to the emergency department with complaints of  diffuse abdominal pain.  Per the patient he started feeling unwell approximately  1 week ago.  A few episodes of vomiting over the last 24 hours associated with  chills.  He denied fever, diarrhea.  He denies alcohol including take for over a  year.    In the emergency department he was afebrile and hemodynamically stable except  for blood pressure of 180/104.  This is likely related to not taking his  antihypertensives prior to arrival.  Chemistries were remarkable for a lipase of  109.  CBC revealed a white count of 12.86.  Abdominal CT showed minor  peripancreatic stranding at  the pancreatic head which could represent  pancreatitis.  No evidence of appendicitis.      Hospital Course:    #Acute pancreatitis  Etiology unclear.  History of pancreatitis in 2020 as well.  Lipase 109 on  admission.  CTAP with minor peripancreatic stranding at the pancreatic head.  LFTs and calcium normal.  Denies alcohol intake over the past year.  Triglycerides only mildly elevated.  MRCP done during his last episode did not  reveal any etiology either. Given IVF, bowel rest, analgesia. Evaluated by GI.  Per GI, could be actual acute pancreatitis versus just a gastroenteritis given  the morning CT findings and only mildly elevated lipase.  Patient was started on  a low-fat diet which she tolerated.  GI recommended that he follow-up with Dr.  Ian Bushman in office as he is due for a colonoscopy and he should also have an EUS to  rule out microlithiasis.  --Follow IgG4 panel  -Low fat diet for 2 weeks after discharge    #Leukocytosis  Elevated initially likely in the setting of acute pancreatitis.Resolved. Blood  cultures pending.    #Hypertension  High blood pressures.  Continue amlodipine.  Takes benazepril 40 Mg at home as  well so lisinopril 20 Mg started  last night  --Adjust medications as needed.    #Headache - Tylenol as needed    #Left inguinal hernia on CT  Out-patient management    Please make an appointment to follow up with your PCP within a week. Please ask  the PCP about the result of the blood culture. Please also have your blood  pressure monitored as it was high.    Please make an appointment to follow up gastroenterologist Dr. Birdie Sons Na for a  colonoscopy and an endoscopic ultrasound. Please ask her about the result of  IgG4 that is still pending.    Physical Exam:  BP (!) 141/94 (BP Location: Right arm, Patient Position: Sitting)   Pulse 75    Temp 98.5 F (36.9 C) (Oral)   Resp 18   Ht 1.803 m (5\' 11" )   SpO2 94%    BMI 27.00 kg/m    General: Alert, well-developed, well-nourished , in no acute  distress.  HEENT: Unremarkable  Respiratory: Chest CTA B/L  CVS: S1 + S2, no murmurs/rubs/gallops  GI: Non-distended, soft, non-tender, audible bowel sounds  Neuro: No gross neurologic abnormalities  Skin: No visible rashes, ulcers or lesions  Psych: Alert, oriented to place, person and time. Mood stable.  Extremities: No edema or erythema      Pending Lab/Microbiology  Pending Data     Order Current Status   IgG 1, 2, 3, and 4 In process            Follow-up Appointments:  Follow up with PCP within 1 week  Follow up with Gastroenterology    Discharge instructions:    Diet Instructions     Other eating instructions (specify):   Low fat diet for 2 weeks  Please make an appointment to follow up with your PCP within a week. Please ask  the PCP about the result of the blood culture. Please also have your blood  pressure monitored as it was high.    Please make an appointment to follow up gastroenterologist Dr. Birdie Sons Na for a  colonoscopy and an endoscopic ultrasound. Please ask her about the result of  IgG4 that is still pending.      Follow up Visits     Patient will make their own follow ups - [place] use only            Discharge plan was discussed with patient only  who verbalized understanding.  Educational materials were provided. More than 30 minutes were spent in  coordinating the discharge planning.      Signed by: Valerie Roys, MD

## 2021-03-25 NOTE — Consults (Signed)
 Gastroenterology Inpatient Follow up        Date of Service: 03/25/2021    Patient: Timothy Morris  DOB: January 21, 1971  MRN#: [id number]    Reason For Consultation: Abdominal Pain    Interval events:  -Transition to low-fat diet last night which she tolerated well.  -Only mild abdominal discomfort this morning which the patient feels is related  to the fact that he has not a bowel movement in 4 days.      Past Medical History:  Past Medical History:  Diagnosis Date   Back pain   Diverticulitis   Gout   Hypertension    Past Surgical History:  Procedure Laterality Date   SHOULDER ARTHROSCOPY Right 1998   EGD AND COLONOSCOPY N/A 02/14/2019   Surgeon: Twana First, MD PhD;  Location: [facility];  Service: Gastroenterology;  Laterality: N/A;   ABDOMINAL SURGERY   BOWEL RESECTION LARGE/COLON  2010 [facility]   resection for diverticulitis   COLON SURGERY   HERNIA REPAIR  2012 [facility]   KNEE ARTHROSCOPY Right    No family history on file.  Social History    Tobacco Use   Smoking status: Never   Smokeless tobacco: Never  Vaping Use   Vaping Use: Never used  Substance Use Topics   Alcohol use: Never   Drug use: Never      Allergies:  Patient has no known allergies.    Current Medications:  Medications Prior to Admission  Medication Sig   amLODIPine-benazepril (LOTREL) 10-40 mg per capsule Take 1 capsule by mouth  daily.   atorvaSTATin (LIPITOR) 40 MG tablet Take 40 mg by mouth at bedtime.   carisoprodol (SOMA) 250 mg tablet Take 1 tablet (250 mg total) by mouth 3 times  a day.    Current Facility-Administered Medications  Medication   amLODIPine (NORVASC) tablet 10 mg   docusate sodium (COLACE) capsule 100 mg   enoxaparin (LOVENOX) injection 40 mg   lisinopriL (ZESTRIL) tablet 20 mg   peripheral line: sodium chloride (NS) 0.9% flush   psyllium (METAMUCIL) 3.4 gram packet 1 packet   sennosides (SENOKOT) tablet 17.2 mg   sodium chloride (NS) 0.9% flush      Review of Systems:  Review of Systems  Constitutional:  Negative for chills, fatigue and  fever.  Respiratory: Negative.  Cardiovascular: Negative.  Gastrointestinal:  Positive for abdominal pain, nausea and vomiting. Negative  for constipation and diarrhea.  Psychiatric/Behavioral:  Negative for confusion.    Physical Exam:  Recent Vitals for the past 24 hrs (Last 1 readings):   BP Pulse Resp  03/25/21 1110 -- 75 --  03/25/21 1038 (!) 141/94 (!) 100 18      TMAX: Temp (24hrs), Avg:98.5 F (36.9 C), Min:98 F (36.7 C), Max:99 F (37.2  C)    Physical Exam  Constitutional:     General: He is not in acute distress.     Appearance: Normal appearance. He is not ill-appearing.  Eyes:     General: No scleral icterus.  Cardiovascular:     Heart sounds: Normal heart sounds.  Pulmonary:     Breath sounds: Normal breath sounds.  Abdominal:     General: Bowel sounds are normal.     Palpations: Abdomen is soft.     Tenderness: There is no abdominal tenderness. There is no guarding or  rebound.  Skin:     Coloration: Skin is not jaundiced.  Neurological:     Mental Status: He is oriented to person,  place, and time.        Results for orders placed or performed during the hospital encounter of 03/22/21  (from the past 24 hour(s))  CBC  Result Value Ref Range   WBC 9.59 4.00 - 11.00 K/uL   RBC 4.84 4.10 - 5.60 M/uL   Hemoglobin 15.7 12.7 - 16.7 g/dL   Hematocrit 10.2 72.5 - 50.0 %   MCH 32.4 25.0 - 35.0 pg   MCHC 35.8 31.0 - 36.0 g/dL   MCV 91 82 - 98 fL   RDW 12.3 11.5 - 15.0 %   Platelet Count 208 150 - 450 K/uL   MPV 11.3 8.0 - 14.0 fL  Comprehensive Metabolic Panel  Result Value Ref Range   Sodium 137 134 - 144 mmol/L   Potassium 4.6 3.2 - 5.1 mmol/L   Chloride 101 97 - 109 mmol/L   Total CO2 25 20 - 32 mmol/L   Anion Gap 11 5 - 15 mmol/L   BUN 23 9 - 26 mg/dL   Creatinine, Blood 1.2 0.7 - 1.3 mg/dL   Glucose, Blood 366 (H) 70 - 110 mg/dL   Calcium 9.7 8.5 - 44.0 mg/dL   Total Protein 7.9 6.1 - 8.2 g/dL   Albumin, Blood 4.1 3.4 - 5.0 g/dL   Globulin Result 3.8 2.5 - 4.3 g/dL   AST (SGOT) 30 15 - 37 IU/L   ALT  (SGPT) 49 0 - 55 IU/L   Alkaline Phosphatase 59 40 - 150 IU/L   Total Bilirubin 0.8 0.2 - 1.2 mg/dL   Estimated GFR(CKD-EPI) 69 >=60 mL/min/BSA  CBC and Differential  Result Value Ref Range   WBC 6.69 4.00 - 11.00 K/uL   RBC 4.87 4.10 - 5.60 M/uL   Hemoglobin 16.0 12.7 - 16.7 g/dL   Hematocrit 34.7 42.5 - 50.0 %   MCH 32.9 25.0 - 35.0 pg   MCHC 36.0 31.0 - 36.0 g/dL   MCV 91 82 - 98 fL   RDW 12.4 11.5 - 15.0 %   Platelet Count 225 150 - 450 K/uL   MPV 11.3 8.0 - 14.0 fL   Neutrophil 70.0 %   Lymphocyte 20.8 %   Monocyte 7.6 %   Eosinophil 0.9 %   Basophil 0.4 %   Immature Granulocyte (Meta, Myelo, Promyelocyte) 0.3 %   Absolute Neutrophil Count 4.68 1.50 - 7.70 K/uL   Absolute Lymphocyte Count 1.39 1.00 - 5.00 K/uL   Absolute Monocyte Count 0.51 0.10 - 1.00 K/uL   Absolute Eosinophil Count 0.06 0.00 - 0.70 K/uL   Absolute Basophil Count 0.03 0.00 - 0.20 K/uL   Absolute Immature Granulocyte (Meta, Myelo, Promyelocyte) 0.02 0.00 - 0.09 K/uL    CT Abdomen And Pelvis W IV Contrast    Result Date: 03/22/2021  EXAM DESCRIPTION:  CT ABDOMEN AND PELVIS W CONTRAST CLINICAL HISTORY:  50 y/o  M  with right lower quadrant abdominal pain. COMPARISON:  Abdomen and pelvis CT  dated 02/10/2019.  MRCP dated 02/12/2019. TECHNIQUE:  - - Helical axial CT  imaging was performed through the abdomen and pelvis with intravenous contrast.  2D reconstructions were performed. - MIPS Measure #359 "Standard nomenclature  was used for Dose Index registry submission."  MIPS Measure [id number] "Patient  Exposure to Ionizing Radiation was submitted to [employer]ose[employer] Dose Index  Registry."  Measure #436 "Adaptive Iterative Dose Reduction (AIDR) and NEMA XR  25 DOSE check software, were used to reduce radiation dose to the patient."  FINDINGS:  - -  Lower thorax:  Normal. - Liver:  Normal. - Biliary tree:  Normal  gallbladder.  No abnormal intrahepatic or extrahepatic ductal dilatation. -  Pancreas:  The pancreatic parenchyma is normal and the main  pancreatic duct is  not dilated.  Question mild peripancreatic stranding at the head. - Spleen:  Normal. - Adrenals:  Normal. - Kidneys, ureters, and bladder:  No  hydronephrosis, nephrolithiasis, or mass.  The ureters and decompressed urinary  bladder are normal. - Pelvic organs:  Not well characterized by CT.  However,  the CT appearance of the prostate and seminal vesicles are unremarkable for age.  - Lymph nodes:  Few prominent periportal lymph nodes are similar to the prior  exam. - Vasculature:  Ectasia of the infrarenal aorta.  Aortic calcifications.  The major branches of the abdominal aorta are well opacified. - Hollow viscera:  No CT evidence of appendicitis (series 2 image 104). Minimal descending and  sigmoid diverticulosis coli. - Mesentery and omentum:  Otherwise unremarkable. -  Superficial soft tissues:  Small fat containing left inguinal hernia. - Bones:  Degenerative changes. IMPRESSION: 1. No CT evidence of appendicitis. 2. Minor  peripancreatic stranding at the pancreatic head.  This can represent a  pancreatitis.  Please correlate with a lipase. 3. Few incidentals as above.    IMPRESSION AND PLAN  This is a 50 y/o M with PMH as detailed above, who presents with his second bout  of acute pancreatitis (last one being in 2020). Workup last admission, which  included an MRCP, did not reveal a cause. He denies any new medication, trauma,  or heavy alcohol use. Triglyceride not elevated enough to explain pancreatitis.  Overall the patient appears well and even states that this flare is a lot better  than his last admission in 2020.  IgG4 is pending. 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  He has tolerated a diet and progressed  quickly.  Wonder if this is actual acute pancreatitis versus just a  gastroenteritis given the borderline CT findings and only mildly elevated  lipase.    Recommendations:  - Okay to discharge today with follow-up.  - He should follow-up in the  office with Dr. Ian Bushman and is due for a colonoscopy. He  should have an EUS to rule out microlithiasis on discharge as well.  Please  contact us prior to discharge to arrange this follow-up  -Follow-up IgG4 level that was sent    Thank you for involving [place] in the care of this patient.    Gunnar Bulla, MD  Ph- [phone]  Fx- [phone]

## 2021-03-26 ENCOUNTER — Telehealth (INDEPENDENT_AMBULATORY_CARE_PROVIDER_SITE_OTHER)

## 2021-03-26 ENCOUNTER — Telehealth (HOSPITAL_BASED_OUTPATIENT_CLINIC_OR_DEPARTMENT_OTHER): Admitting: Pulmonary Disease

## 2021-03-26 ENCOUNTER — Encounter

## 2021-03-26 ENCOUNTER — Ambulatory Visit: Payer: PRIVATE HEALTH INSURANCE | Attending: Adult Health | Primary: Internal Medicine

## 2021-03-26 NOTE — Telephone Encounter (Signed)
 Patient discharged from [facility] on 03/25/2021.  Reason for Admission: Acute Pancreatitis without infection or necrosis.    Spoke with patient  He states that he is not feeling 100% and would like to make an appointment in the office. Patient made appointment with Emily Filbert NP on 03/30/2021 @ 11:00AM

## 2021-03-26 NOTE — Telephone Encounter (Signed)
 Pt will c/b to schedule one he receives his CPAP machine.

## 2021-03-29 ENCOUNTER — Telehealth (INDEPENDENT_AMBULATORY_CARE_PROVIDER_SITE_OTHER): Admitting: Internal Medicine

## 2021-03-29 NOTE — Telephone Encounter (Addendum)
 Hello.  Attached you will find an OOMW Referral Order that has been prepared by the Central Referral Team.  Please review the referral and the patient responses to the OOMW Referral Questions.  If this is TMP patient, PCP needs to add explanation as to why patient is not being Redirected at the bottom of the Q&A list on the Additional Order Details page.    We encourage you to call and speak with the patient if you do not feel this referral is appropriate or you wish to redirect the patient.  You are welcome to add information to the document if you choose.  If you agree with referral and content:  1. Fill out Clinical Question field in the order.  2. Sign the Referral Order.  3. Sign the Telephone Encounter  The referral will go automatically to Central Referral Workqueue to be processed and reviewed by [facility] Medical Director.  Thank you!  OON Referral Information  Patient is requesting an out of system specialist visit.    Explain patient's medical problem: pancreatitis  Has patient seen/spoken with PCP for this problem?  Has patient seen a [facility] specialist for this problem?  Specialist Hospital system?         [facility]     Specialist ZHY:[phone]   ERIC Ethelene Browns ARDOLINO M.D.  Does patient have a past established relationship with this specialist or this practice?                Estimated last past visit:   Number of visits authorized:                    Appointment date for this request: 03/29/2021  Any other info: Patient was in patient at [facility] recently and patient to have colonoscopy.  Procedure in November but office asked to start referral today in case a sooner appt becomes available.   Fax#  [phone]    Document which member of your team has educated patient that:  In the future, this PCP prefers that their patients choose a specialist by having a discussion with PCP team first.  Our preferred specialists are in [facility] and [facility] : Lupita Leash    For PCP TMP Comments: for cancer screening measure

## 2021-03-30 ENCOUNTER — Other Ambulatory Visit

## 2021-03-30 ENCOUNTER — Ambulatory Visit
Admit: 2021-03-30 | Discharge: 2021-03-30 | Payer: PRIVATE HEALTH INSURANCE | Attending: Registered Nurse | Primary: Internal Medicine

## 2021-03-30 ENCOUNTER — Encounter

## 2021-03-30 VITALS — BP 150/82 | HR 82 | Ht 70.0 in | Wt 224.0 lb

## 2021-03-30 DIAGNOSIS — I10 Essential (primary) hypertension: Secondary | ICD-10-CM

## 2021-03-30 NOTE — Assessment & Plan Note (Signed)
 Not well controlled and not taking metoprolol  Check BP daily when relaxed and return in 2 weeks for review  Low salt diet

## 2021-03-30 NOTE — Assessment & Plan Note (Signed)
 Unclear cause  Has f/u with GI for colonoscopy  Continue bland diet, good hydration,

## 2021-03-30 NOTE — Progress Notes (Signed)
 [facility] [address]  [facility] [address]  [address]  [place] [address]  Dept: [phone]  Dept Fax: [phone]     Patient ID: Timothy Morris is a 50 y.o. male who presents for Follow-up ([facility] discharge 03/25/2021/Flu Injection declined today).    Subjective   #Acute pancreatitis  Etiology unclear. History of pancreatitis in 2020 as well. Lipase 109 on admission. CTAP with minor peripancreatic stranding at the pancreatic head. LFTs and calcium normal. Denies alcohol intake over the past year. Triglycerides only mildly elevated. MRCP done during his last episode did not reveal any etiology either. Given IVF, bowel rest, analgesia. Evaluated by GI. Per GI, could be actual acute pancreatitis versus just a gastroenteritis given the morning CT findings and only mildly elevated lipase. Patient was started on a low-fat diet which she tolerated. GI recommended that he follow-up with Dr. Ian Bushman in office as he is due for a colonoscopy and he should also have an EUS to rule out microlithiasis.  --Follow IgG4 panel  -Low fat diet for 2 weeks after discharge    #Leukocytosis  Elevated initially likely in the setting of acute pancreatitis.Resolved. Blood cultures pending.    #Hypertension  High blood pressures. Continue amlodipine. Takes benazepril 40 Mg at home as well so lisinopril 20 Mg started last night  --Adjust medications as needed.    #Headache - Tylenol as needed    #Left inguinal hernia on CT  Out-patient management    Please make an appointment to follow up with your PCP within a week. Please ask the PCP about the result of the blood culture. Please also have your blood pressure monitored as it was high.     Please make an appointment to follow up gastroenterologist Dr. Birdie Sons Na for a colonoscopy and an endoscopic ultrasound. Please ask her about the result of IgG4 that is still pending.    My colonoscopy is scheduled  I dont feel  100%, I wanted to leave hospital a little earlier due to  dtr in college, following bland diet, I have been breaking out in a sweat, comes and goes only at night, denies fever abd pain,           Objective   Visit Vitals  BP (!) 150/82   Pulse 82   Ht 1.778 m   Wt 102 kg   SpO2 98%   BMI 32.14 kg/m   BSA 2.24 m       Physical Exam  Vitals reviewed.   Constitutional:       Appearance: Normal appearance.   Eyes:      Extraocular Movements: Extraocular movements intact.   Cardiovascular:      Rate and Rhythm: Normal rate and regular rhythm.   Pulmonary:      Effort: Pulmonary effort is normal.      Breath sounds: Normal breath sounds.   Musculoskeletal:      Right lower leg: No edema.      Left lower leg: No edema.   Neurological:      Mental Status: He is alert and oriented to person, place, and time.   Psychiatric:         Mood and Affect: Mood normal.         Assessment/Plan   Timothy Morris was seen today for follow-up.  Primary hypertension  Assessment & Plan:  Not well controlled and not taking metoprolol  Check BP daily when relaxed and return in 2 weeks for review  Low salt diet  Acute pancreatitis, unspecified complication status, unspecified pancreatitis type  Assessment & Plan:  Unclear cause  Has f/u with GI for colonoscopy  Continue bland diet, good hydration,

## 2021-04-13 ENCOUNTER — Other Ambulatory Visit

## 2021-04-13 ENCOUNTER — Ambulatory Visit
Admit: 2021-04-13 | Discharge: 2021-04-13 | Payer: PRIVATE HEALTH INSURANCE | Attending: Registered Nurse | Primary: Internal Medicine

## 2021-04-13 VITALS — BP 126/72 | HR 61 | Temp 97.7°F | Ht 71.0 in | Wt 224.6 lb

## 2021-04-13 DIAGNOSIS — I10 Essential (primary) hypertension: Secondary | ICD-10-CM

## 2021-04-13 NOTE — Assessment & Plan Note (Addendum)
 Has failed multiple meds and declines more will call ortho for f/u on recent denial

## 2021-04-13 NOTE — Progress Notes (Addendum)
 [facility] [address]  [facility] [address]  [address]  [place] [address]  Dept: [phone]  Dept Fax: [phone]     Patient ID: Timothy Morris is a 50 y.o. male who presents for No chief complaint on file..    Subjective   Diet: tries to eat healthy,   Activity: not really  Meds: tolerating  BP at home  Denies CP. SOB, headache, dizziness or ankle swelling   Denies increased thirst urination or hunger, denies hypoglycemia      I am having alot of back pain so exercise is hard, I was denied another injection, and has had a nerve ablation in both sides and left side still not right,  I have a f/u with agility          Objective   Visit Vitals  BP 126/72 (BP Location: Left arm, Patient Position: Sitting, BP Cuff Size: Large adult)   Pulse 61   Temp 36.5 C (97.7 F) (Temporal)   Ht 1.803 m   Wt 102 kg   SpO2 97%   BMI 31.33 kg/m   BSA 2.26 m       Physical Exam  Vitals reviewed.   Constitutional:       Appearance: Normal appearance.   Eyes:      Extraocular Movements: Extraocular movements intact.   Cardiovascular:      Rate and Rhythm: Normal rate and regular rhythm.   Pulmonary:      Effort: Pulmonary effort is normal.      Breath sounds: Normal breath sounds.   Musculoskeletal:      Right lower leg: No edema.      Left lower leg: No edema.   Neurological:      Mental Status: He is alert and oriented to person, place, and time.   Psychiatric:         Mood and Affect: Mood normal.         Assessment/Plan   Primary hypertension  Assessment & Plan:  low salt diet, increase water  exercise 150 min/week at a minimum  manage stress  Take meds as ordered    Class 1 obesity due to excess calories with serious comorbidity and body mass index (BMI) of 31.0 to 31.9 in adult  Assessment & Plan:  Healthy eating plan low in fat, carbs and more vegetables  Drink 2 liters of water daily  Exercise a minimum of 150 min weekly  Healthy coping    Orders:  -     Ambulatory referral to Nutrition  Services; Future  Degenerative disc disease, lumbar  Assessment & Plan:  Has failed multiple meds and declines more will call ortho for f/u on recent denial

## 2021-04-13 NOTE — Assessment & Plan Note (Signed)
 Pt has rare use of valium he says but PDMP states he fills #2month, will call psych Megan to see if we can decrease amt he is given to like 5 per month and often pt only takes 1/2 pill,

## 2021-04-13 NOTE — Assessment & Plan Note (Signed)
 Healthy eating plan low in fat, carbs and more vegetables  Drink 2 liters of water daily  Exercise a minimum of 150 min weekly  Healthy coping

## 2021-04-13 NOTE — Progress Notes (Signed)
 Patient declined flu shot

## 2021-04-13 NOTE — Assessment & Plan Note (Signed)
low salt diet, increase water  exercise 150 min/week at a minimum  manage stress  Take meds as ordered

## 2021-04-26 ENCOUNTER — Telehealth (INDEPENDENT_AMBULATORY_CARE_PROVIDER_SITE_OTHER): Admitting: Registered Nurse

## 2021-04-26 NOTE — Telephone Encounter (Signed)
ERROR

## 2021-04-27 ENCOUNTER — Encounter (HOSPITAL_BASED_OUTPATIENT_CLINIC_OR_DEPARTMENT_OTHER)

## 2021-04-27 ENCOUNTER — Other Ambulatory Visit

## 2021-04-27 ENCOUNTER — Institutional Professional Consult (permissible substitution): Admit: 2021-04-27 | Payer: PRIVATE HEALTH INSURANCE | Primary: Internal Medicine

## 2021-04-27 DIAGNOSIS — E6609 Other obesity due to excess calories: Secondary | ICD-10-CM

## 2021-04-27 NOTE — Progress Notes (Signed)
 [facility] NUTRITION AT [facility]  [address]  [place] [address]  [phone]         Outpatient Nutrition Assessment  Name: Timothy Morris  MRN: [id number]  DOB: January 02, 1971    Date Seen: 04/27/2021    Chief Complaint    Obesity; Weight Loss         Shauna Hugh, NP  Visit Type: In Person    No Known Allergies    Pertinent Medications?    Current Outpatient Medications:   ?  amLODIPine-benazepriL (Lotrel) 10-40 mg capsule, Take 1 capsule by mouth in the morning., Disp: 90 each, Rfl: 2  ?  atorvastatin (Lipitor) 40 mg tablet, TAKE 1 TABLET BY MOUTH EVERY DAY TO LOWER CHOLESTEROL, Disp: 90 tablet, Rfl: 1    Pertinent  Labs  Lab Results   Component Value Date    CHOL 110 11/16/2020     Lab Results   Component Value Date    HDL 32 (L) 11/16/2020     Lab Results   Component Value Date    LDLCALC 36 11/16/2020     Lab Results   Component Value Date    TRIG 210 (H) 11/16/2020     No results found for: CHOLHDL      Past Medical History:   Diagnosis Date   ? Erectile dysfunction    ? Fatty liver    ? History of acute pancreatitis    ? History of gout    ? Primary hypertension        Assessment    Anthropometrics  Height Assessment Utilized: Patient reported  Height: 179.1 cm (5'10.5")  Weight: 103 kg (228# with shoes)  Weight Method: Standing scale  IBW (kg) (Calculated) : 74.15 kg  BMI (Calculated): 32.24  Growth Pattern/Weight History: 224# 04/13/21      Assessment and Summary   Timothy Morris is a 50 y.o. male who attends an initial nutrition visit today for weight management. He also notes recent pancreatitis and confusion how to implement low fat diet as he feels his choices are currently stagnant.  He denies alcohol intake. Has been working to eat healthier choices since discharge from hospital admission.  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       Food allergies/Intolerances: No  Pain with eating or digesting foods: No    Physical Activity:   Limited d/t back injury    Diet History:  Meals per day:  3  Snacks per day: 1    Meals away from home frequency: 1-2 days per month    Social Environment: lives alone, has daughters stay a couple days per week    Speed of eating: rapid      Diet Recall:  Coffee with 2% milk  Breakfast:Kashi cereal (2 cups) with 1% milk  Snack: occasionally multi grain bagel either plain or with peanut butter, stopped cream cheese  Lunch: whole foods [place] breast on wheat bread or chicken/rice soup or if at home will make grilled chicken over garden vegetables  Snack: may graze on celery/carrot sticks at work  Dinner: 9:30-10pm chicken with garlic, balsamic vinegar with broccoli and rice  Snack:    Nocturnal eating: No    Beverage/Hydration preferences:         Nutrition Diagnosis  Nutrition Diagnosis  Nutrition Diagnosis: Nutrition knowledge deficit  Related to: low fat diet with pancreatitis and diet intervention with weight loss  As Evidenced By: BMI class I obesity, diet recall, patient  report  DX Continues : Yes    Interventions   Interventions  Nutrition Interventions: Specified foods/beverage or groups, Recommended modifications, Skill development/problem solving, Nutrition counseling    Recommendations  Meal Planning Tips, Meal/Snack Ideas, Dietary/Lifestyle Changes, Eat every 3-4 hours and Individualized Meal Plan- low fat diet for pancreatitis    Goals  Provided written educational information of pancreatitis and foods to avoid/select  Discussed options to fuel body in a more balanced way- include lean PRO with CHO or vegetables    Monitoring/Evaluation   Demonstrates diet compliance/positive behavior changes  Improved lipid profile  Weight trending towards goal  Ability to recall/implement goals      Visit Information / Follow Up  Visit Details  Start Time/End Time : 45 min    No follow-ups on file.    Laurene Footman, RD

## 2021-04-28 ENCOUNTER — Telehealth
Admit: 2021-04-28 | Discharge: 2021-04-28 | Payer: PRIVATE HEALTH INSURANCE | Attending: Physical Medicine & Rehabilitation | Primary: Internal Medicine

## 2021-04-28 ENCOUNTER — Other Ambulatory Visit

## 2021-04-28 DIAGNOSIS — M5416 Radiculopathy, lumbar region: Secondary | ICD-10-CM

## 2021-04-28 NOTE — Progress Notes (Signed)
 [facility]  [facility]  [address]  [place] [address]  Dept: [phone]  Dept Fax: [phone]     This real-time, interactive virtual Telehealth encounter was done by phone with the patient's verbal consent. Two patient identifiers were used and confirmed. Physical location of the patient: home Patient resides in: [place]  Physical location of the provider: office. Other participants/involvement: none  Total minutes spent: 30 of which 10 minutes were spent with the [place], 15 minutes were spent in direct patient interaction with the provider, and 5 were spent in pre/post  charting and coordination of care.      Chief complaint/History of Present Illness:  Timothy Morris is a pleasant 50 y.o. year old male who presents for ongoing of his confined to the low back, without radiation. He reports numbness in his low back.       [name] now rates his pain as 9/10 VAS at its worst.  The pain is described as aching and sharp and can be exacerbated by standing and yardwork while alleviated by none.      We talked, at length, regarding the patient's ongoing pain despite his attempts at a home exercise program which have only brought him more pain.  We reviewed previous notes together regarding his LEFT and RIGHT sided L2-4 medial branch and L5 dorsal rami radiofrequency ablations that alleviated his RIGHT sided low back pain and increased his left.      Past Medical History:   Diagnosis Date   ? Erectile dysfunction    ? Fatty liver    ? History of acute pancreatitis    ? History of gout    ? Primary hypertension      Past Surgical History:   Procedure Laterality Date   ? INGUINAL HERNIA REPAIR     ? KNEE ARTHROSCOPY W/ DEBRIDEMENT Right     x2   ? LEFT COLECTOMY      Sigmoid   ? ROTATOR CUFF REPAIR Right      Family History   Problem Relation Name Age of Onset   ? Breast cancer Mother     ? No Known Problems Sister       Social History     Socioeconomic History   ? Marital status: Divorced     Spouse  name: Not on file   ? Number of children: 2   ? Years of education: Not on file   ? Highest education level: Not on file   Occupational History   ? Occupation: Air cabin crew   Tobacco Use   ? Smoking status: Never   ? Smokeless tobacco: Never   Substance and Sexual Activity   ? Alcohol use: Never   ? Drug use: Yes     Types: Marijuana   ? Sexual activity: Not on file   Other Topics Concern   ? Not on file   Social History Narrative   ? Not on file     Social Determinants of Health     Financial Resource Strain: Not on file   Food Insecurity: Not on file   Transportation Needs: Not on file   Physical Activity: Not on file   Stress: Not on file   Social Connections: Not on file   Intimate Partner Violence: Not on file   Housing Stability: Not on file        Medications:  Current Outpatient Medications   Medication Instructions   ? amLODIPine-benazepriL (Lotrel) 10-40 mg capsule 1 capsule,  oral, Daily   ? atorvastatin (Lipitor) 40 mg tablet TAKE 1 TABLET BY MOUTH EVERY DAY TO LOWER CHOLESTEROL       Review of Systems:  Pertinent items are noted in HPI.      Assessment/Plan:    Spinal anatomy using the patient's imaging as an example was reviewed.  Management including physical therapy, spinal injections, and surgery was discussed.  A stretching and exercise program was presented and encouraged.    Problem List Items Addressed This Visit        Nervous    Lumbar radiculopathy - Primary    Overview     02/29/2020: MRI LS spine Shields  diffuse desiccation of the      intervertebral discs, although disc heights are grossly maintained.      The conus terminates normally at L1.        T12-L1: No significant spinal canal stenosis or neural foraminal      narrowing.      L1-2: Shallow disc bulge, without significant spinal canal stenosis      or neural foraminal narrowing.      L2-3: There is a disc bulge and bilateral facet hypertrophy as well      as borderline ligamentum flavum infolding, together resulting in mild       spinal canal stenosis and mild bilateral neural foraminal narrowing.      L3-4: There is a left paracentral to lateral disc protrusion with      annular fissure and bilateral facet arthropathy, resulting in      narrowing of the left lateral recess, mild right and moderate left      neural foraminal narrowing, with impingement of the left exiting L3      nerve root.  There is no significant spinal canal stenosis.      L4-5: There is a disc bulge and bilateral facet arthropathy,      resulting in mild bilateral neural foraminal narrowing, but no      significant spinal canal stenosis.      L5-S1: There is a disc bulge, without significant spinal canal      stenosis or neural foraminal narrowing         Relevant Orders    MR LUMBAR SPINE WO CONTRAST       Musculoskeletal    Lumbosacral spondylosis without myelopathy    Overview     11/18/2020: LEFT and RIGHT L2-4 medial branch and L5 dorsal rami radiofrequency ablations with resolution of RIGHT sided pain, but persistent pain in the LEFT low back  12/10/2020: MRI ordered and denied.         Current Assessment & Plan     04/28/21  Advanced imaging  medical necessity:  1. This symptom has persisted for at least six (6) weeks within the last six months   2. No significant benefit noted  from over-the-counter or prescribed NSAIDs/Tylenol and rehabilitation efforts through structured physical therapy or home exercise program.  3.  Moderate pain persists with numeric scale of 6/10 or more with activity.  4. Symptoms has resulted in poor quality of life and decline in function.  5. He has regularly attempted a home exercise program, but has been unable to do so secondary to pain.  Given the persistent symptoms, I have recommended an MRI of the LS spine for further evaluation.           Relevant Orders    MR LUMBAR SPINE WO CONTRAST

## 2021-04-28 NOTE — Assessment & Plan Note (Signed)
 04/28/21  Advanced imaging  medical necessity:  1. This symptom has persisted for at least six (6) weeks within the last six months   2. No significant benefit noted  from over-the-counter or prescribed NSAIDs/Tylenol and rehabilitation efforts through structured physical therapy or home exercise program.  3.  Moderate pain persists with numeric scale of 6/10 or more with activity.  4. Symptoms has resulted in poor quality of life and decline in function.  5. He has regularly attempted a home exercise program, but has been unable to do so secondary to pain.  Given the persistent symptoms, I have recommended an MRI of the LS spine for further evaluation.

## 2021-06-16 ENCOUNTER — Encounter

## 2021-06-16 NOTE — H&P (Signed)
 Brief Pre-Endoscopy Assessment  Date of Service: 06/16/2021  Patient: Timothy Morris  DOB: 19-Sep-1970  MRN#: [id number]  Attending: Gunnar Bulla, MD  Admit Date: 06/16/2021  Primary Care Physician: Karen Chafe, MD      Pre Procedure Assessment - Endoscopic Procedure with Sedation  Indication:  Colonoscopy- Surveillance. H/o TA in 2020, suboptimal prep (1 year  follow up recommended)    Past Medical History, Medications and Allergies : Reviewed    Brief Pre-endoscopy Physical Exam:  BP (!) 159/90   Pulse 62   Temp 98.3 F (36.8 C) (Temporal)   Resp 16   Ht  1.803 m (5\' 11" )   Wt (!) 102.1 kg (225 lb)   SpO2 96%   BMI 31.38 kg/m  ASA Level: 2  Mallampati Score: 2    Oropharynx unobstructed, Heart Regular, Lungs Clear    Plan:  The indication for the procedure is appropriate. The patient is an  appropriate candidate for the procedure. After consent - will proceed with  procedure as planned.    Please see procedure report for full details    Gunnar Bulla, MD  Ph -[phone]  Fx- [phone]

## 2021-06-16 NOTE — Op Note (Signed)
Patient Name     Timothy RankinsMARC Morris  Date of Birth     [birth year]  Record Number     [id number]  Account Number:      [id number]  Date/Time of Procedure     06/16/2021 11:00:00 AM  Referring Physician    Skip Mayerimothy Tierney M.D.  Endoscopist    Gunnar BullaEric Amaan Meyer , M.D.    PROCEDURE PERFORMED:  Colonoscopy with snare and biopsy polypectomy    INDICATIONS FOR EXAMINATION: Surveillance. H/o TA in 2020, suboptimal prep (1  year follow up recommended)    Medications::Versed 5 mg Fentanyl 100 mcg  Tolerance: Good     Complications:      Estimated Blood Loss: <5 ml  Extent of Exam:cecum     Preparation: adequate  ASA/ Mallampati score: ASA 2, Mallampati 2  Procedure Technique: The patient's medical  history was reviewed.   A physical  exam was performed and findings noted on the ACU Face Sheet.  The procedure, its  limitations including the possibility of missed lesions, its risks including  bleeding, infection, perforation and reaction to medications were explained to  the patient or proxy who indicated understanding and agreed to proceed.  After  adequate conscious sedation was achieved, a digital exam was performed and the  colonoscope introduced into the rectum and advanced under direct visualization  to the   as identified by landmarks. The scope was subsequently removed slowly  while the colonic mucosa was carefully examined.  A retroflex exam was  performedin the rectum.  The patient tolerated the procedure well.  The patient  was subsequently transferred to the recovery area in satisfactory condition.  The following findings were noted:    FINDINGS:  The scope was advanced to the cecum. There were two polyps in the descending  colon. OPne was 7 mm, semi-pedunculated. It was removed with a cold snare. There  was a small amount of bleeding from the polypectomy site initially which  resolved after about 20 seconds. Two endoclips were placed successfully to close  the polypectomy defect as prophylaxis for bleeding. There was an additional 3  mm  polyp in the descending colon removed with biopsy forceps. There was evidence of  a prior colo-colonic anastmosis from a colon resection of the sigmoid colon.    ENDOSCOPIC DIAGNOSIS:  2 small colon polyps-Removed, 2 endoclips placed prophylactically  Hemorrhoids    RECOMMENDATIONS:  Resume high fiber diet  Repeat colonoscopy in 5 years because of the polyp on this exam  I will call you with the biopsy results within 7-10 days. If you have not heard  from me within 10 days, please call [phone] to discuss.  Avoid nonsteroidals (Advil, Aleve, Ibuprofen, Motrin etc) for 1 week  Follow up with Me and Dr. Skip Mayerimothy Tierney M.D. as needed    Recall 5 years    [phone] Colonoscopy, flexible; with removal of tumor(s), polyp(s), or other  lesion(s) by snare technique  [phone] Colonoscopy, flexible; with biopsy, single or multiple  99152 Moderate sedation services provided by the same physician or other  qualified health care professional performing the diagnostic or therapeutic  service that the sedation supports, requiring the presence of an independent  trained observer to assist in the monitoring of the patient's level of  consciousness and physiological status; initial 15 minutes of intraservice time,  patient age 53 years or older  [phone] Moderate sedation services provided by the same physician or other  qualified health care professional performing the diagnostic or therapeutic  service that the sedation supports, requiring the presence of an independent  trained observer to assist in the monitoring of the patient's level of  consciousness and physiological status; each additional 15 minutes intraservice  time (list separately in addition to code for primary service)  K63.5 Polyp of colon  Z12.11 Screening for colon cancer    Gunnar Bulla, MD  [facility] [place]  ph  [phone]  fax  [phone]            This electronic signature authenticates all electronic and/or handwritten  documentation,  including orders, generated by the signer during the episode of  care contained in this record.06/16/2021 12:48:41 PM by Gunnar Bulla

## 2021-06-18 LAB — SURGICAL PATHOLOGY EXAM

## 2021-07-16 ENCOUNTER — Encounter

## 2021-07-25 ENCOUNTER — Other Ambulatory Visit (INDEPENDENT_AMBULATORY_CARE_PROVIDER_SITE_OTHER): Admitting: Family

## 2021-07-26 NOTE — Telephone Encounter (Signed)
Appointment:        Date of patient's next encounter in the current department:  10/11/2021   Date of patient's next encounter with the current provider:  Visit date not found   Date of patient's last encounter in the current department: 04/13/2021      Last BP:   BP Readings from Last 3 Encounters:   04/13/21 126/72   03/30/21 (!) 150/82   12/04/20 136/73       Last HGBA1C: No results found for: HGBA1C    Labs  No results found for: TSH  Lab Results   Component Value Date    CALCIUM 9.6 11/16/2020    ALBUMIN 4.1 11/16/2020    NA 140 11/16/2020    K 4.4 11/16/2020    CO2 26 11/16/2020    CL 106 11/16/2020    BUN 18 11/16/2020    CREATININE 1.10 11/16/2020      AST   Date Value Ref Range Status   11/16/2020 35 6 - 42 U/L Final     ALT   Date Value Ref Range Status   11/16/2020 64 (H) 0 - 55 U/L Final     No components found for: TBILI  Hemoglobin   Date Value Ref Range Status   11/16/2020 15.2 13.0 - 17.5 g/dL Final     Hematocrit   Date Value Ref Range Status   11/16/2020 44.4 37.0 - 53.0 % Final     Platelets   Date Value Ref Range Status   11/16/2020 173 150 - 400 K/uL Final     No results found for: PTT  No results found for: INR  No results found for: PROTIME  No results found for: PTADJUSTED    Last Urine Drug Screen: on .  Narcotic Medications:    Directives/Controlled Substance Agreement   PMP Appropriate __YES      ___No

## 2021-09-02 ENCOUNTER — Telehealth (INDEPENDENT_AMBULATORY_CARE_PROVIDER_SITE_OTHER): Admitting: Internal Medicine

## 2021-09-02 ENCOUNTER — Encounter (INDEPENDENT_AMBULATORY_CARE_PROVIDER_SITE_OTHER)

## 2021-09-02 NOTE — Telephone Encounter (Signed)
The Central Referral Team has prepared this In-PHO Referral Order for you.  Your patient provided the information used to complete the details.  When you sign off it implies that you are in agreement that this referral is the appropriate clinical plan for this patient.    In order to attach a Referral Order to you we needed to open a Phone Note Encounter which also needs to be signed.  Please:  1. Review the Order for clinical appropriateness  2. Sign the Order  3. Sign off the Phone Note Encounter    Thanks- Central Referral Team

## 2021-09-13 NOTE — H&P (Signed)
H&P    Subjective    Patient is a 51 y.o. male presents with Idiopathic Acute Recurrent Pancreatitis.  Two previous episodes of idiopathic pancreatitis requiring hospitalization. IgG4  negative. MRCP and CT abdomen without clear cause. Minimal alcohol use. No  smoking history. No traumas or inciting medications.    The following portions of the patient's history were reviewed and updated as  appropriate: allergies, current medications, past family history, past medical  history, past social history, past surgical history, and problem list.      Objective      BP (!) 173/91   Pulse 55   Temp 97.1 F (36.2 C) (Temporal)   Resp 18   Ht  1.803 m (5\' 11" )   Wt (!) 104.3 kg (230 lb)   SpO2 96%   BMI 32.08 kg/m  General appearance: alert, appears stated age, cooperative, and no distress  Lungs: clear to auscultation bilaterally  Heart: regular rate and rhythm, S1, S2 normal, no murmur, click, rub or gallop  Abdomen: soft, non-tender; bowel sounds normal; no masses,  no organomegaly  Neurologic: Grossly normal    Impression: Idiopathic Acute Recurrent Pancreatitis.    Plan: EUS.

## 2021-09-13 NOTE — Op Note (Signed)
[facility], [place], [place] [address]    Patient Name     Timothy Morris  Date of Birth     02-19-1971  Record Number     [id number]  Account Number:      [id number]  Date/Time of Procedure     09/13/2021 10:00:00 AM  Referring Physician    [name] M.D.  Endoscopist    Evaristo Bury , M.D.    PROCEDURE PERFORMED:  EUS - Esophagogastroduodenoscopy, flexible, transoral;  with endoscopic ultrasound examination, including the esophagus, stomach, and  either the duodenum or a surgically altered stomach where the jejunum is  examined distal to the anastomosis  EGD with Biopsy    INDICATIONS FOR EXAMINATION: Idiopathic Acute Recurrent Pancreatitis. Two  previous episodes of idiopathic pancreatitis requiring hospitalization. IgG4  negative. Triglycerides Mildly Elevated and Less than 1000. MRCP and CT abdomen  without clear cause. Minimal to No alcohol use. No smoking history. No traumas  or inciting medications. Grandmother with Pancreatic Cancer in 90's. Uses  Cannabis. Referred by Dr. Gunnar Bulla for EUS evaluation.    Medications::Monitored Anesthesia Care (MAC)  Tolerance: Good     Complications:      Estimated Blood Loss: <5 ml  Extent of Exam:Second Portion of the Duodenum     Preparation:  ASA/ Mallampati score: ASA 2/Mallampati 2  Procedure Technique: The patient's medical  history was reviewed.  A physical  exam was performed.  Pertinent findings are noted in the procedural/assessment  form.  The procedure, its limitations including the possibility of missed  lesions, its risks including bleeding, infection, perforation and reaction to  medications were explained to the patient or proxy who indicated understanding  and agreed to proceed.   After informed consent and adequate conscious sedation  was achieved the scope was advanced under direct visualization to the second  part of the duodenum. The following findings were noted:    FINDINGS:  Endoscopic Findings:  1) Esophagus: Normal Esophagus. No Endoscopic  Evidence of Erosive Esophagitis,  Barrett's Esophagus, Mass Lesion, Stricture, or Hiatal Hernia.  2) Stomach: Patchy Areas of Erythematous Gastric Mucosa consistent with  Gastritis noted. Random Gastric Biopsies obtained from the Body and Antrum of  the Stomach to evaluate for H. pylori. No Ulcers or Mass Lesions seen.  3) Duodenum: Normal Duodenum. No Ulcers or Mass Lesions seen.    Endoscopic Ultrasound/EUS Findings:  1) The Pancreas had a Normal EUS Appearance. The Pancreatic Duct measured 1-2 mm  in Diameter and Was Not Dilated. No Mass Lesions, Cystic Lesions, or EUS  Evidence of Chronic Pancreatitis seen.  2) The Gallbladder had Echogenic Flecks consistent with Biliary Microlithiasis.  3) The Common Bile Duct/CBD measured 3.8 mm (Normal) and Did Not Have Any EUS  Evidence of Stones or Sludge.  4) No Pancreatico-Biliary or Ampullary Mass Lesions seen on EUS.    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 Patient may benefit from Cholecystectomy. Cannabis use can also  be associated with Pancreatitis and reduction in Cannabis Use might Help. Prior  Evaluation for other causes of Pancreatitis has been unremarkable. Pancreas and  Biliary Tree were otherwise Unremarkable on EUS and No Worrisome Findings on  today's exam. No EUS  Evidence of Pancreatic Mass, Cystic Lesion, or Chronic  Pancreatitis.  2) Gastritis. Likely related to Gastric Irritants (NSAIDs, Alcohol, etc.).  Gastric Biopsies obtained to evaluate for H. pylori.  3) Otherwise Unremarkable EGD/EUS.    RECOMMENDATIONS:  1) Follow-up biopsy  results.  2) Surgery Consultation regarding Cholecystectomy for management of Biliary  Microlithiasis Related Pancreatitis.  3) Strict Avoidance/Abstinence from Alcohol recommended. Avoidance of  Medications associated with Pancreatitis recommended (Ace Inhibitors, Thiazide  Diuretics, PPIs, etc.). Patient Advised to Avoid/Limit Use of Cannabis.  4)  Outpatient GI Follow-up with Referring Physician, Dr. Gunnar Bulla.  5) Follow-up with Primary Care Physician.  6) As needed Follow-up with me.  7) Further recommendations to be determined by biopsy results, response to above  measures, and post procedural clinical course.    [phone] Esophagogastroduodenoscopy, flexible, transoral; with endoscopic  ultrasound examination, including the esophagus, stomach, and either the  duodenum or a surgically altered stomach where the jejunum is examined distal to  the anastomosis  K85.0 Idiopathic acute pancreatitis  K83.8 Other specified diseases of biliary tract  K29.70 Gastritis, unspecified, without bleeding          This electronic signature authenticates all electronic and/or handwritten  documentation, including orders, generated by the signer during the episode of  care contained in this record.09/13/2021 10:48:15 AM by Evaristo Bury

## 2021-09-14 LAB — SURGICAL PATHOLOGY EXAM

## 2021-09-17 ENCOUNTER — Ambulatory Visit
Admit: 2021-09-17 | Discharge: 2021-09-17 | Payer: PRIVATE HEALTH INSURANCE | Attending: Internal Medicine | Primary: Internal Medicine

## 2021-09-17 DIAGNOSIS — I10 Essential (primary) hypertension: Secondary | ICD-10-CM

## 2021-09-17 MED ORDER — cloNIDine (Catapres) 0.1 mg tablet
0.1 | ORAL_TABLET | Freq: Every evening | ORAL | 3 refills | Status: DC
Start: 2021-09-17 — End: 2021-09-24

## 2021-09-17 NOTE — Progress Notes (Signed)
[facility] [address]  [facility] [address]  [address]  [place] [id number]  Dept: [phone]  Dept Fax: [phone]     Patient ID: Timothy FairlyMarc K Morris is a 51 y.o. male who presents for Hypertension.    Subjective   HPI  51 year old gentleman follow-up for hypertension.  Doing home readings he is now on Lotrel 10/40.  [facility] still running an elevated fashion.  He is also having chronic back problems as well as headaches from time to time it is a little bit hard to discern if the headaches are reactionary or isolated from primary hypertension.  He has follow-ups planned with pain management.  Current Outpatient Medications   Medication Instructions   . amLODIPine-benazepriL (Lotrel) 10-40 mg capsule TAKE 1 CAPSULE BY MOUTH EVERY MORNING   . atorvastatin (Lipitor) 40 mg tablet TAKE 1 TABLET BY MOUTH EVERY DAY TO LOWER CHOLESTEROL   . cloNIDine (CATAPRES) 0.2 mg, oral, Nightly   . famotidine (PEPCID) 20 mg, oral, 2 times daily PRN     Past Medical History:   Diagnosis Date   . Erectile dysfunction    . Fatty liver    . History of gout    . Primary hypertension    . 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     Past Surgical History:   Procedure Laterality Date   . ESOPHAGOGASTRODUODENOSCOPY  2023   . INGUINAL HERNIA REPAIR     . KNEE ARTHROSCOPY W/ DEBRIDEMENT Right     x2   . LEFT COLECTOMY      Sigmoid   . ROTATOR CUFF REPAIR Right      Family History   Problem Relation Name Age of Onset   . Breast cancer Mother     . No Known Problems Sister       Social History     Tobacco Use   . Smoking status: Never   . Smokeless tobacco: Never   Vaping Use   . Vaping status: Not on file   Substance Use Topics   . Alcohol use: Never   . Drug use: Yes     Types: Marijuana     Review of Systems   Constitutional: Negative.    Respiratory: Negative.    Cardiovascular: Negative.    Gastrointestinal: Negative.     Musculoskeletal: Positive for back pain.   Neurological: Positive for headaches.       Objective   Visit Vitals  Pulse 67   Ht 1.791 m   Wt 107 kg   SpO2 98%   BMI 33.38 kg/m   BSA 2.31 m       Physical Exam  Constitutional:       Appearance: Normal appearance. He is obese.   Cardiovascular:      Rate and Rhythm: Normal rate and regular rhythm.   Pulmonary:      Breath sounds: Normal breath sounds.   Neurological:      General: No focal deficit present.      Cranial Nerves: No cranial nerve deficit.         Assessment/Plan   Vernia BuffMarc was seen today for hypertension.  Primary hypertension  -     cloNIDine (Catapres) 0.1 mg tablet; Take 2 tablets (0.2 mg) by mouth at bedtime.  Blood pressure control still less than ideal we will add clonidine to Lotrel.  Patient will follow-up in 3 months for recheck.

## 2021-09-20 ENCOUNTER — Ambulatory Visit
Admit: 2021-09-20 | Discharge: 2021-09-20 | Payer: PRIVATE HEALTH INSURANCE | Attending: Physical Medicine & Rehabilitation | Primary: Internal Medicine

## 2021-09-20 DIAGNOSIS — M5416 Radiculopathy, lumbar region: Secondary | ICD-10-CM

## 2021-09-20 NOTE — Progress Notes (Signed)
 [address]  [place], [place] [address]    [address]  [place], [place] [address]    Phone: [phone]  Fax:      [phone]  E-mail: [email or web address]         PATIENT NAME:  Timothy Morris   PATIENT DOB:  06/09/1971     Chief complaint/History of Present Illness:  Timothy Morris is a pleasant 51 y.o. year old male who presents for ongoing evaluation of his  confined to the low back, without radiation.    Timothy Morris rates his pain as 8/10 VAS at its worst.  he describes the pain as sharp.  The pain can be exacerbated by sitting, standing, walking and moving, sleeping while alleviated by none.  Symptoms are worse in the morning.     Symptoms have been present for 3 years and include stiffness and weakness in low back. Initial inciting event: none.     I last saw Timothy Morris on 04/28/2021 via telehealth when we discussed his persistent left-sided low back pain despite left and right sided L3-4 medial branch and L5 dorsal ramus radiofrequency ablations that were performed on 11/18/2020.    Since then, Timothy Morris has continued his daily home exercise program without benefit.  Further, he finds no relief from oral medications.    He also tells me that his worse pain is in the shoulder blade region.  This has been going on for a year and is aching in character rating upto 8/10 VAS at its worst.  Stretch  History:  Past Medical History:   Diagnosis Date   . Erectile dysfunction    . Fatty liver    . History of gout    . Primary hypertension    . 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     Past Surgical History:   Procedure Laterality Date   . ESOPHAGOGASTRODUODENOSCOPY  2023   . INGUINAL HERNIA REPAIR     . KNEE ARTHROSCOPY W/ DEBRIDEMENT Right     x2   . LEFT COLECTOMY      Sigmoid   . ROTATOR CUFF REPAIR Right      Family History   Problem Relation Name Age of Onset   . Breast cancer Mother     . No Known Problems Sister        Social History     Socioeconomic History   .  Marital status: Divorced     Spouse name: Not on file   . Number of children: 2   . Years of education: Not on file   . Highest education level: Not on file   Occupational History   . Occupation: Air cabin crew   Tobacco Use   . Smoking status: Never   . Smokeless tobacco: Never   Vaping Use   . Vaping status: Not on file   Substance and Sexual Activity   . Alcohol use: Never   . Drug use: Yes     Types: Marijuana   . Sexual activity: Not on file   Other Topics Concern   . Not on file   Social History Narrative   . Not on file     Social Determinants of Health     Financial Resource Strain: Not on file   Food Insecurity: Not on file   Transportation Needs: Not on file   Physical Activity: Not on file   Stress: Not on file   Social Connections: Not  on file   Intimate Partner Violence: Not on file   Housing Stability: Not on file        Patient has no known allergies.    Medications:  Current Outpatient Medications   Medication Instructions   . amLODIPine-benazepriL (Lotrel) 10-40 mg capsule TAKE 1 CAPSULE BY MOUTH EVERY MORNING   . atorvastatin (Lipitor) 40 mg tablet TAKE 1 TABLET BY MOUTH EVERY DAY TO LOWER CHOLESTEROL   . cloNIDine (CATAPRES) 0.2 mg, oral, Nightly   . famotidine (PEPCID) 20 mg, oral, 2 times daily PRN       Review of Systems:  Pertinent items are noted in HPI    PHYSICAL EXAMINATION:  GENERAL: well appearing, alert and oriented x3, appropriate mood/affect in no acute distress  CVS: peripheral pulses are intact and symmetric  CHEST: symmetric chest rise, no shortness of breath  HEENT: NC/AT  SKIN: No acute lesions affecting the face, trunk or upper/lower extremities  FEET/ANKLES: no cyanosis, clubbing, edema  HEAD/NECK, SPINE/RIBS/PELVIS, UPPER LIMBS: no misalignment/instability. ROM and upper extremity muscle strength are grossly intact  GAIT/STATION: normal  SPINE: The pelvis is level.  There is tenderness to palpation bilaterally overlying the lumbosacral zygopophyseal joints and paraspinal  muscles.  There is muscle spasm bilaterally in the lumbosacral paraspinal muscles.  Supine straight leg raise, femoral stretch, and Patrick's tests are negative bilaterally.  SPINE RANGE OF MOTION: There is diminished range of motion in lumbar flexion, extension, lateral flexion, and rotation with increased pain in all dimensions.  MUSCLE STRENGTH: 5/5 bilaterally in the hip flexors, hip abductors, hip adductors,  plantar flexors. 4+/5 in the LEFT DF while the right rate 5/5.  SENSATION: Equal and symmetric bilaterally in the L3-S1 dermatomes to pinprick sensation.  MUSCLE TENDON REFLEXES: Equal and symmetric in the quadriceps.  Abent LEFT achilles reflex while the right is full.  No sustained clonus could be elicited either ankle.  Toes are downgoing to plantar stimuli bilaterally.  CEREBELLAR: Coordination is grossly intact  ADDITIONAL PHYSICAL EXAMINATIONS: Hip and knee maneuvers provoked no pain    Assessment/Plan:    Spinal anatomy using the patient's imaging as an example was reviewed.  Management including physical therapy, spinal injections, and surgery was discussed.  A stretching and exercise program was presented and encouraged.    Problem List Items Addressed This Visit           ICD-10-CM       Nervous    Cervical radiculopathy M54.12    Overview     09/20/21: > 1 year of shoulder blade soreness with no PT/chiro etc.         Current Assessment & Plan     I have asked the patient to attend PT for further management of his shoulder blade pain.  Further, I have asked him to learn the HEP and transition to a home based program.         Relevant Orders    Ambulatory referral to Physical Therapy    RESOLVED: Lumbar radiculopathy - Primary M54.16    Overview     09/20/21: the patient pain has been refractory to conservative management and more advanced treatment including LEFT and RIGHT L3-4 medial branch and L5 dorsal rami radiofrequency ablation performed at least a year ago.  He has continued a daily  exercise program without any long term benefit.         Current Assessment & Plan     Advanced imaging  medical necessity:  1. This symptom has  persisted for at least six (6) weeks within the last six months   2. No significant benefit noted  from over-the-counter or prescribed NSAIDs/Tylenol and rehabilitation efforts through structured physical therapy or home exercise program.  3.  Moderate pain persists with numeric scale of 6/10 or more with activity.  4. Symptoms has resulted in poor quality of life and decline in function.  Given the persistent symptoms, I have recommended an MRI of the LS spine.         Relevant Orders    MR LUMBAR SPINE WO CONTRAST       Other    Abnormal MRI, lumbar spine R93.7    Overview     02/29/2020: MRI LS spine Shields  diffuse desiccation of the      intervertebral discs, although disc heights are grossly maintained.      The conus terminates normally at L1.        T12-L1: No significant spinal canal stenosis or neural foraminal      narrowing.      L1-2: Shallow disc bulge, without significant spinal canal stenosis      or neural foraminal narrowing.      L2-3: There is a disc bulge and bilateral facet hypertrophy as well      as borderline ligamentum flavum infolding, together resulting in mild      spinal canal stenosis and mild bilateral neural foraminal narrowing.      L3-4: There is a left paracentral to lateral disc protrusion with      annular fissure and bilateral facet arthropathy, resulting in      narrowing of the left lateral recess, mild right and moderate left      neural foraminal narrowing, with impingement of the left exiting L3      nerve root.  There is no significant spinal canal stenosis.      L4-5: There is a disc bulge and bilateral facet arthropathy,      resulting in mild bilateral neural foraminal narrowing, but no      significant spinal canal stenosis.      L5-S1: There is a disc bulge, without significant spinal canal      stenosis or neural foraminal  narrowing        Other Visit Diagnoses       Codes    Back pain     M54.9

## 2021-09-20 NOTE — Assessment & Plan Note (Signed)
 Advanced imaging  medical necessity:  1. This symptom has persisted for at least six (6) weeks within the last six months   2. No significant benefit noted  from over-the-counter or prescribed NSAIDs/Tylenol and rehabilitation efforts through structured physical therapy or home exercise program.  3.  Moderate pain persists with numeric scale of 6/10 or more with activity.  4. Symptoms has resulted in poor quality of life and decline in function.  Given the persistent symptoms, I have recommended an MRI of the LS spine

## 2021-09-20 NOTE — Assessment & Plan Note (Signed)
 I have asked the patient to attend PT for further management of his shoulder blade pain.  Further, I have asked him to learn the HEP and transition to a home based program.

## 2021-09-22 NOTE — Telephone Encounter (Signed)
The Central Referral Team has prepared this In-PHO Referral Order for you.  Your patient provided the information used to complete the details.  When you sign off it implies that you are in agreement that this referral is the appropriate clinical plan for this patient.    In order to attach a Referral Order to you we needed to open a Phone Note Encounter which also needs to be signed.  Please:  1. Review the Order for clinical appropriateness  2. Sign the Order  3. Sign off the Phone Note Encounter    Thanks- Central Referral Team    Auto-Approved Specialty Appointment    The Central Referral Team has prepared this Referral Order for you.  This request does not need Medical Director review and can be automatically processed.  Your patient provided the information used to complete the details.  In order to attach a Referral Order to you, we needed to open a Phone Note Encounter which also needs to be signed.  Please:  4. Review the Order  5. Sign the Order  6. Sign off the Phone Note Encounter    Thanks- Central Referral Team

## 2021-09-24 MED ORDER — chlorthalidone (Hygroton) 25 mg tablet
25 | ORAL_TABLET | Freq: Every day | ORAL | 1 refills | 30.00000 days | Status: DC
Start: 2021-09-24 — End: 2021-12-17

## 2021-09-24 NOTE — Telephone Encounter (Signed)
Duplicate order. Refused medication.  Please sign.

## 2021-09-24 NOTE — Telephone Encounter (Signed)
Patient states that he cannot take the clonidine due to making him not feel right. He states that it is making his mouth feel dry and he is already dealing with that now. He is wondering if there is something else we can send in for him for blood pressure. Please advise.

## 2021-09-24 NOTE — H&P (Signed)
General Surgery New Patient Office Visit    Date of Service: 09/24/2021    Patient: Timothy Morris  DOB: [birth year]  MRN#: [id number]    Referred by: Karen Chafe, MD  [address]  [place],  [place] [address]    PCP:  Karen Chafe, MD  [address]  [place] [address]    Reason for Consultation: idiopathic acute recurrent pancreatitis    History of Present Illness: JAHLIL ZILLER is a 51 y.o. male with a history as  below s/p open colectomy for diverticulitis and subsequent hernia repair with  mesh placement (Dr. [name]) who presents as a referral by Dr. Coral Else for  evaluation for possible cholecystectomy for his recurrent episode of  pancreatitis. Per patient report and chart review, he had two episodes of  pancreatitis requiring hospitalization. Subsequently he underwent evaluation and  work up by GI that revealed  IgG4 negative. Imaging studies including MRCP and  CT abdomen did not reveal clear etiology. He subsequently had an EUS by Dr.  Bryson Corona possible microlithiasis within the gallbladder and for that reason he was  referred to the surgery clinic.  He reports minimal alcohol use. He states that  last week he had episodes of hypertension up to SBP=158mmHg and that his PCP  started him on a new medication that is causing him significant dry mouth and he  is not taking it. Denies fevers, chills, nausea, emesis, changes in bowel  habits.    Review of Systems: Remainder of review of systems is negative except as  mentioned in the HPI.    Past Medical History  Past Medical History:  Diagnosis Date   Back pain   Diverticulitis   Gout   Hypertension   Pancreatitis      Past Surgical History  Past Surgical History:  Procedure Laterality Date   SHOULDER ARTHROSCOPY Right 1998   EGD AND COLONOSCOPY N/A 02/14/2019   Surgeon: Twana First, MD PhD;  Location: [facility];  Service: Gastroenterology;  Laterality: N/A;   COLONOSCOPY N/A 06/16/2021   Surgeon: Minerva Areola A. Ardolino, MD;  Location: [facility];   Service:  Gastroenterology;  Laterality: N/A;   ENDOSCOPIC ULTRASOUND WITH EGD N/A 09/13/2021   Surgeon: Ruben Reason R. Bryson Corona, MD;  Location: [facility];  Service:  Gastroenterology;  Laterality: N/A;   ABDOMINAL SURGERY   BOWEL RESECTION LARGE/COLON  2010 [facility]   resection for diverticulitis   COLON SURGERY   HERNIA REPAIR  2012 [facility]   KNEE ARTHROSCOPY Right      Family History  No family history on file.    Social History  Social History    Socioeconomic History   Marital status: Divorced  Tobacco Use   Smoking status: Never   Smokeless tobacco: Never  Vaping Use   Vaping Use: Never used  Substance and Sexual Activity   Alcohol use: Never   Drug use: Yes    Types: Marijuana      Allergies  Patient has no known allergies.    Medications  Outpatient Encounter Medications as of 09/24/2021  Medication Sig Dispense Refill   amLODIPine-benazepril (LOTREL) 10-40 mg per capsule Take 1 capsule by mouth  daily.   atorvaSTATin (LIPITOR) 40 MG tablet Take 1 tablet (40 mg total) by mouth at  bedtime.   carisoprodol (SOMA) 250 mg tablet Take 1 tablet (250 mg total) by mouth 3 times  a day. 15 tablet 0    No facility-administered encounter medications on file as  of 09/24/2021.      Physical Exam:  BP 120/80 (BP Location: Right arm, Patient Position: Sitting)   Ht 1.803 m (5'  11")   Wt (!) 104.3 kg (230 lb)   BMI 32.08 kg/m  General: awake, alert and oriented in no apparent distress  Neuro: fluent and conversational, motor and sensory function grossly intact in  all 4 extremities  Psych: normal affect, appropriate responses  HEENT: normocephalic, atraumatic, extraocular movements intact, no scleral  icterus  Heart: regular rate and rhythm  Lungs: no respiratory distress  Abdomen: Obese, soft, non tender, non distended. No tenderness in the right  upper quadrant, no hepatosplenomegaly. Well healed incisions    Labs:            Imaging: All imaging was reviewed and interpreted independently.  No results found for this or any  previous visit.    Results for orders placed during the hospital encounter of 03/22/21    CT Abdomen And Pelvis W IV Contrast    Narrative  EXAM DESCRIPTION:  CT ABDOMEN AND PELVIS W CONTRAST    CLINICAL HISTORY:  51 y/o  M with right lower quadrant abdominal pain.    COMPARISON:  Abdomen and pelvis CT dated 02/10/2019.  MRCP dated  02/12/2019.    TECHNIQUE:  -  -  Helical axial CT imaging was performed through the abdomen and pelvis  with intravenous contrast.  2D reconstructions were performed.  -  MIPS Measure #359 "Standard nomenclature was used for Dose Index  registry submission."  MIPS Measure [phone] "Patient Exposure to  Ionizing Radiation was submitted to [employer] Dose[employer]ise Dose Index  Registry."  Measure #436 "Adaptive Iterative Dose Reduction (AIDR)  and NEMA XR 25 DOSE check software, were used to reduce radiation  dose to the patient."    FINDINGS:  -  -  Lower thorax:  Normal.  -  Liver:  Normal.  -  Biliary tree:  Normal gallbladder.  No abnormal intrahepatic or  extrahepatic ductal dilatation.  -  Pancreas:  The pancreatic parenchyma is normal and the main  pancreatic duct is not dilated.  Question mild peripancreatic  stranding at the head.  -  Spleen:  Normal.  -  Adrenals:  Normal.  -  Kidneys, ureters, and bladder:  No hydronephrosis, nephrolithiasis,  or mass.  The ureters and decompressed urinary bladder are normal.  -  Pelvic organs:  Not well characterized by CT.  However, the CT  appearance of the prostate and seminal vesicles are unremarkable for  age.  -  Lymph nodes:  Few prominent periportal lymph nodes are similar to the  prior exam.  -  Vasculature:  Ectasia of the infrarenal aorta.  Aortic  calcifications.  The major branches of the abdominal aorta are well  opacified.  -  Hollow viscera: No CT evidence of appendicitis (series 2 image 104).  Minimal descending and sigmoid diverticulosis coli.  -  Mesentery and omentum:  Otherwise unremarkable.  -  Superficial soft tissues:  Small fat  containing left inguinal hernia.  -  Bones:  Degenerative changes.    Impression  1. No CT evidence of appendicitis.  2. Minor peripancreatic stranding at the pancreatic head.  This can  represent a pancreatitis.  Please correlate with a lipase.  3. Few incidentals as above.    No results found for this or any previous visit.    No results found for this or any previous visit.    No results found for  this or any previous visit.    Results for orders placed during the hospital encounter of 02/10/19    US Abdomen Limited - RUQ only    Narrative  EXAM DESCRIPTION:  US ABDOMEN LIMITED    CLINICAL HISTORY:  RUQ abdominal pain, initial exam    COMPARISON:  None.    TECHNIQUE:  Sagittal and transverse images    FINDINGS:  The liver demonstrates increased echogenicity compatible  with hepatic steatosis.  No focal hepatic masses are seen.  The  biliary tree is normal in appearance without dilatation of the intra-  or extrahepatic bile ducts.  The gallbladder is normal without  cholelithiasis.  Pancreas was not fully visualized.  The right kidney  appear normal.  Visualized portions of the aorta and IVC are normal  with maximum dimension of the aorta 1.9 cm.  common bile duct diameter:  4 mm  right kidney length:  11.5 cm    Impression  1. Hepatic steatosis  2. Remainder study normal except the pancreas was not fully assessed.      Assessment & Plan: AGOSTINO GORIN is a 51 y.o. 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 I informed him that there is a possibility that his  recurrent episodes of pancreatitis were caused by the gallbladder microlithiasis  although this is not certain. I offered surgical intervention on the form of  laparoscopic cholecystectomy. I explained to the patient the risks involved in  such an undertaking.  These include but are not limited to bleeding, infection,  damage to surrounding structures (both recognized  and unrecognized) conversion  to an open procedure, biliary leak, abscess, biloma formation and unsightly  scarring. He indicated he understood.  He indicated he felt the risks were  inherent to such an operation and lastly indicated he accepted the risks and  still wished to proceed with surgery.  I informed him that he will require his blood pressure to be better controlled  prior to moving forward with surgery. He states that he is waiting to hear back  from his PCP in order to switch to a different second antihypertensive. Finally  I informed the patient that there is a possibility that his episodes of  pancreatitis might not resolve after cholecystectomy and at that point he will  require further evaluation.    All questions were answered to the patient's satisfaction. The patient expressed  understanding and was in agreement with the plan.    Thank you for allowing me to participate in the care of this patient.      Signed by: Claris Che, MD PhD

## 2021-09-24 NOTE — Progress Notes (Signed)
Unable to tolerate clonidine looking for alternative, will add chlorthalidone discontinue clonidine

## 2021-09-24 NOTE — Telephone Encounter (Signed)
Left detailed message to make patient aware

## 2021-10-04 ENCOUNTER — Ambulatory Visit
Admit: 2021-10-04 | Discharge: 2021-10-06 | Payer: PRIVATE HEALTH INSURANCE | Attending: Physical Medicine & Rehabilitation | Primary: Internal Medicine

## 2021-10-04 DIAGNOSIS — M5416 Radiculopathy, lumbar region: Secondary | ICD-10-CM

## 2021-10-04 NOTE — Assessment & Plan Note (Signed)
Considering the patient's ongoing pain that has been refractory to conservative  management including physical therapy/supervised home exercise program and NSAIDs/prescribed oral medications for at least 6 weeks during the last 6 months and is appreciable in an examination that reveals neurologic deficits that parallel the patient's MRI findings, it is medically necessary to order: L5-S1 interlaminar epidural steroid injection under fluoroscopic guidance.    Risks and benefits were explained.  All questions were answered.   The consent form will be signed on the procedure day.

## 2021-10-04 NOTE — Progress Notes (Signed)
[id number]  [place], [place] [address]    [address]  [place], [place] [address]    Phone: [phone]  Fax:      [phone]  E-mail: [email or web address]         PATIENT NAME:  Timothy Morris   PATIENT DOB:  11-Jun-1971     Chief complaint/History of Present Illness:  Timothy Morris is a pleasant 51 y.o. year old male who presents for ongoing evaluation of his  non radiating LEFT and RIGHT sided low back pain.  While the pain is typically on the left, the right side has been bothering him more lately.Timothy Morris rates his pain as 9/10 VAS at its worst.  he describes the pain as aching and piercing.  The pain can be exacerbated by activity including standing while alleviated by bending forward or sitting on a soft cushion.  Symptoms are worse in the morning.     Since his last visit, the patient had an MR of the LS spine that was performed at [facility] on 10/03/2021.  He has kept up with the home exercise program over the last 6 months, since the initial interlaminar epidural steroid injection was ordered.  He has continued his oral medications without any great relief.  He continues to feel the aching, piercing, burning pain radiating into the LEFT buttock that is also exacerbated by long car rides.        History:  Past Medical History:   Diagnosis Date   . Erectile dysfunction    . Fatty liver    . History of gout    . Primary hypertension    . 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     Past Surgical History:   Procedure Laterality Date   . ESOPHAGOGASTRODUODENOSCOPY  2023   . INGUINAL HERNIA REPAIR     . KNEE ARTHROSCOPY W/ DEBRIDEMENT Right     x2   . LEFT COLECTOMY      Sigmoid   . ROTATOR CUFF REPAIR Right      Family History   Problem Relation Name Age of Onset   . Breast cancer Mother     . No Known Problems Sister        Social History     Socioeconomic History   . Marital status: Divorced     Spouse name: Not on file   . Number of children: 2   .  Years of education: Not on file   . Highest education level: Not on file   Occupational History   . Occupation: Air cabin crew   Tobacco Use   . Smoking status: Never   . Smokeless tobacco: Never   Vaping Use   . Vaping status: Not on file   Substance and Sexual Activity   . Alcohol use: Never   . Drug use: Yes     Types: Marijuana   . Sexual activity: Not on file   Other Topics Concern   . Not on file   Social History Narrative   . Not on file     Social Determinants of Health     Financial Resource Strain: Not on file   Food Insecurity: Not on file   Transportation Needs: Not on file   Physical Activity: Not on file   Stress: Not on file   Social Connections: Not on file   Intimate Partner Violence: Not on file   Housing  Stability: Not on file        Patient has no known allergies.    Medications:  Current Outpatient Medications   Medication Instructions   . amLODIPine-benazepriL (Lotrel) 10-40 mg capsule TAKE 1 CAPSULE BY MOUTH EVERY MORNING   . atorvastatin (Lipitor) 40 mg tablet TAKE 1 TABLET BY MOUTH EVERY DAY TO LOWER CHOLESTEROL   . chlorthalidone (HYGROTON) 25 mg, oral, Daily   . famotidine (PEPCID) 20 mg, oral, 2 times daily PRN       Review of Systems:  Pertinent items are noted in HPI    PHYSICAL EXAMINATION:  GENERAL: well appearing, alert and oriented x3, appropriate mood/affect in no acute distress  CVS: peripheral pulses are intact and symmetric  CHEST: symmetric chest rise, no shortness of breath  HEENT: NC/AT  SKIN: No acute lesions affecting the face, trunk or upper/lower extremities  FEET/ANKLES: no cyanosis, clubbing, edema  HEAD/NECK, SPINE/RIBS/PELVIS, UPPER LIMBS: no misalignment/instability. ROM and upper extremity muscle strength are grossly intact  GAIT/STATION: normal  SPINE: The pelvis is level.    MUSCLE STRENGTH: 4+/5 in the LEFT dorsi and plantar flexors while the right rates 5/5.  SENSATION: Equal and symmetric bilaterally in the L3-S1 dermatomes to pinprick sensation.  MUSCLE  TENDON REFLEXES: Equal and symmetric in the quadriceps.  The LEFT achilles reflex is absent while the right rates 2+.    No sustained clonus could be elicited either ankle.  Toes are downgoing to plantar stimuli bilaterally.  CEREBELLAR: Coordination is grossly intact      Assessment/Plan:    Spinal anatomy using the patient's imaging as an example was reviewed.  Management including physical therapy, spinal injections, and surgery was discussed.  A stretching and exercise program was presented and encouraged.    Problem List Items Addressed This Visit           ICD-10-CM       Nervous    RESOLVED: Lumbar radiculopathy - Primary M54.16    Overview     09/20/21: the patient pain has been refractory to conservative management and more advanced treatment including LEFT and RIGHT L3-4 medial branch and L5 dorsal rami radiofrequency ablation performed at least a year ago.  He has continued a daily exercise program without any long term benefit.  10/03/2021: MR, LS spine, Shields: Posterior broad-based disc bulging at the L5-S1 level, asymmetric to the left, in a position to irritate the left S1 nerve root.  10/04/21: Left-sided dorsiflexor weakness.  Absent left Achilles reflex.         Current Assessment & Plan     Considering the patient's ongoing pain that has been refractory to conservative  management including physical therapy/supervised home exercise program and NSAIDs/prescribed oral medications for at least 6 weeks during the last 6 months and is appreciable in an examination that reveals neurologic deficits that parallel the patient's MRI findings, it is medically necessary to order: L5-S1 interlaminar epidural steroid injection under fluoroscopic guidance.    Risks and benefits were explained.  All questions were answered.   The consent form will be signed on the procedure day.          Relevant Orders    Lumbar Epidural Steroid Injection       Other    Abnormal MRI, lumbar spine R93.7    Overview      02/29/2020: MRI LS spine Shields  diffuse desiccation of the      intervertebral discs, although disc heights are grossly maintained.  The conus terminates normally at L1.        T12-L1: No significant spinal canal stenosis or neural foraminal      narrowing.      L1-2: Shallow disc bulge, without significant spinal canal stenosis      or neural foraminal narrowing.      L2-3: There is a disc bulge and bilateral facet hypertrophy as well      as borderline ligamentum flavum infolding, together resulting in mild      spinal canal stenosis and mild bilateral neural foraminal narrowing.      L3-4: There is a left paracentral to lateral disc protrusion with      annular fissure and bilateral facet arthropathy, resulting in      narrowing of the left lateral recess, mild right and moderate left      neural foraminal narrowing, with impingement of the left exiting L3      nerve root.  There is no significant spinal canal stenosis.      L4-5: There is a disc bulge and bilateral facet arthropathy,      resulting in mild bilateral neural foraminal narrowing, but no      significant spinal canal stenosis.      L5-S1: There is a disc bulge, without significant spinal canal      stenosis or neural foraminal narrowing  10/03/2021: MR, LS spine, Shields  Five non-rib-bearing lumbar type vertebra.  -  Bones:  No evidence of acute fracture.  Mild chronic anterior wedging  at the T12 and L1 levels, unchanged.  The remaining aspects of the  vertebral body heights are preserved.  There is no suspicious marrow  replacing lesion or focal bone marrow edema identified.  -  Alignment: The usual lumbar lordosis is preserved. There is mild  retrolisthesis of L2 over L3 and L3 over L4.  -  Intervertebral discs/Joints: Multilevel lumbar spondylosis  characterized by loss of disc signal and intervertebral disc space  height in, marginal osteophyte formation, degenerative endplate  changes, disc bulging, facet hypertrophy and ligamentum  flavum  thickening with significant findings by level described below.  -  Thecal sac: The conus medullaris terminates at the inferior L1 level.  The visualized distal spinal cord and cauda equina nerve roots are  unremarkable.  -  Paravertebral soft tissue: Unremarkable.  -  T12-L1:  There is no evidence of significant posterior disc  abnormality, spinal canal stenosis or neural foraminal narrowing.  -  L1-L2:  There is mild diffuse disc bulging without significant spinal  canal stenosis or neural foraminal narrowing.  -  L2-L3:  In addition to the retrolisthesis, there is diffuse disc  bulging, bilateral facet hypertrophy and effusions and ligamentum  flavum thickening resulting in mild spinal canal stenosis, mild  bilateral subarticular zone and neural foraminal narrowing.  -  L3-L4:  In addition to the retrolisthesis, there is left eccentric  disc bulging, bilateral facet hypertrophy and ligamentum flavum  thickening resulting in mild spinal canal stenosis, mild left  subarticular zone and moderate left and mild right neural foraminal  narrowing.  -  L4-L5:  There is mild left eccentric disc bulging, bilateral facet  hypertrophy and ligamentum flavum thickening resulting in mild  bilateral neural foraminal narrowing.  No significant central spinal  canal stenosis.  -  L5-S1:  There is a posterior disc bulge without significant spinal  canal stenosis or neural foraminal narrowing, asymmetric to the left, in a position to irritate the left S1 nerve  root as it emerges from the thecal sac.

## 2021-10-11 ENCOUNTER — Encounter: Payer: PRIVATE HEALTH INSURANCE | Attending: Registered Nurse | Primary: Internal Medicine

## 2021-10-12 ENCOUNTER — Encounter: Payer: PRIVATE HEALTH INSURANCE | Attending: Internal Medicine | Primary: Internal Medicine

## 2021-10-12 DIAGNOSIS — I10 Essential (primary) hypertension: Secondary | ICD-10-CM

## 2021-10-14 NOTE — Telephone Encounter (Signed)
The Central Referral Team has prepared this In-PHO Referral Order for you.  Your patient provided the information used to complete the details.  When you sign off it implies that you are in agreement that this referral is the appropriate clinical plan for this patient.    In order to attach a Referral Order to you we needed to open a Phone Note Encounter which also needs to be signed.  Please:  1. Review the Order for clinical appropriateness  2. Sign the Order  3. Sign off the Phone Note Encounter    Thanks- Central Referral Team    OON Referral Information:GEORGIOS ORTHOPOULOS MD  Patient is requesting an out of system specialist visit. Yes    Explain patient's medical problem: Gallblader  Has patient seen/spoken with PCP for this problem?Yes  Has patient seen a [facility] specialist for this problem?No  Specialist Hospital system?  [facility]             Specialist ZOX:[phone]  Does patient have a past established relationship with this specialist or this practice?  Yes, April , 2023 for consult             Estimated last past visit:  Number of visits authorized:   6                 Appointment date for this request: 10/20/2021  Any other info: Fax:     Document which member of your team has educated patient that:  In the future, this PCP prefers that their patients choose a specialist by having a discussion with PCP team first.  Our preferred specialists are in [facility] and [facility] : [place]    For PCP TMP Comments:

## 2021-10-16 LAB — BASIC METABOLIC PANEL
ANION GAP: 11 mmol/L (ref 5–15)
BUN: 25 mg/dL (ref 9–26)
CALCIUM: 9.8 mg/dL (ref 8.5–10.5)
CHLORIDE: 100 mmol/L (ref 97–109)
CREATININE: 1.2 mg/dL (ref 0.7–1.3)
ESTIMATED GFR (CKD-EPI): 69 mL/min/BSA (ref >=60)
GLUCOSE: 128 mg/dL — ABNORMAL HIGH (ref 70–110)
POTASSIUM: 3.5 mmol/L (ref 3.2–5.1)
SODIUM: 138 mmol/L (ref 134–144)
TOTAL CO2: 27 mmol/L (ref 20–32)

## 2021-10-18 NOTE — Telephone Encounter (Signed)
[facility] calling requesting a clearance letter for patient. Letter just needs to state patient BP is ok and they are faxing EKG to be reviewed as well. EKG in media. Has surgery on Wednesday. Letter fax to [phone]

## 2021-10-19 NOTE — Telephone Encounter (Signed)
Faxed letter

## 2021-10-20 ENCOUNTER — Encounter
Payer: PRIVATE HEALTH INSURANCE | Attending: Rehabilitative and Restorative Service Providers" | Primary: Internal Medicine

## 2021-10-20 NOTE — Progress Notes (Deleted)
Physical Therapy Evaluation    Patient Name: Ronda FairlyMarc K Wooden  MRN: [id number]  DOB: 07/13/1970  Today's Date: 10/20/2021  Referral Diagnosis: M54.16 Lumbar radiculopathy    Subjective     History of Present Condition/Mechanism of Injury: Patient is a 51 y.o. male who presents with a > 3 year h/o LBP which continues despite undergoing NB L3/L4/L5 radiofrequency nerve ablations in 6/'22. MRI exam performed Lumbar spine on 10/04/21 reveals multilevel Lumbar spondylosis and spondylolisthesis mostly at Left L3/L4 neural foramen where there is moderate stenosis       Pertinent Past Medical History/Surgical History:           General Visit Information:       Prior Level of Function:       Current Level of Function:       Home Environment:       Pain           Objective   Posture/Observation       Functional Mobility      Functional Assessment       Range of Motion     {PT ROM:31364}    Strength     {PT STRENGTH:31366}    Palpation       Balance       Neuro-Vascular                           Examination                       Outcome Measures         Assessment   Assessment: Patient's LBP appears due to          Goals:      Plan   Plan       Therapy Time       Tyrone Nineraig Linlee Cromie, PT  [place] lic # [id number]

## 2021-10-20 NOTE — Op Note (Signed)
PREOPERATIVE DIAGNOSIS:  1) Recurrent acute pancreatitis  2) Microlithiasis of gallbladder  3) S/p colectomy for diverticulitis    POSTOPERATIVE DIAGNOSIS: Same as above    PROCEDURE PERFORMED:  Laparoscopic cholecystectomy    SURGEON:  Claris Che MD, PhD    ASSISTANT:    Rich Brave MD    INTRAVENOUS FLUIDS GIVEN:   See Anesthesia note    ESTIMATED BLOOD LOSS:  Minimal    URINE OUTPUT:  None recorded    SPECIMEN SENT:  gallbladder    CONDITION:  Stable    INTRAOPERATIVE FINDINGS:  Distended  gallbladder, no signs of acute inflammation    BRIEF HISTORY AND INDICATION FOR THE PROCEDURE:   [name] who  presented in clinic for evaluation of possible cholecystectomy given his history  of recurrent episodes of acute pancreatitis. He had an EUS that revealed  microlithiasis of his gallbladder and given extensive workup excluding other  causes explaining his pancreatitis episodes the decision to move forward with  surgery was made. After the risks and the benefits of the procedure were  discussed with the patient he agreed to undergo the above mentioned procedure.    DESCRIPTION OF PROCEDURE:  The patient was brought to the operating room and  placed supine on the operating room table. General endotracheal anesthesia was  induced. The patient was started on appropriate antibiotics by the Anesthesia  team. Venodynes were applied. The patient was then prepped and draped in the  usual sterile fashion. A surgical time-out was performed verifying the correct  patient, procedure and members of the team.    Given his prior history of colectomy with subsequent hernia repair in the  infraumbilical area the decision was made to proceed with an optical access  technique.  5cc of 0.25% Marcaine with Epi were then infused in the skin and soft tissues of  the left subcostal region.  Through this site, a stab incision was created with  a #11 blade and a 5mm 0 laparoscope was introduced using the optical  access  technique.  Once intra-abdominal access had been reached, CO2 gas was  insufflated to create a pneumoperitoneum.  Once the intra-abdominal pressures  reached 15 mmHg, the intra-abdominal contents were surveyed with the  laparoscope.  There was no evidence of injury from placement of either the  anesthetic or the port.    Three additional ports were placed all under direct vision. A 12-mm port was  placed in the supraumbilical area avoiding all the adhesions that were covering  the lower abdominal area. Two 5-mm ports were placed in the right upper  quadrant. The patient was placed in reverse Trendelenburg position and slightly  rotated to the left. The fundus of the gallbladder was retracted superiorly and  laterally. The infundibulum was retracted inferiorly and laterally. Adhesions  between the infundibulum and the first portion of the duodenum were lysed with a  combination of blunt and sharp dissection. Electrocautery was used to carefully  begin dissection of the peritoneum down around the base of the gallbladder. The  triangle of Calot was carefully opened up. Eventually, the cystic duct was  identified heading up into the base of the gallbladder. The cystic artery was  also identified within the triangle of Calot. The critical view of safety was  confirmed when only the cystic duct and artery were seen entering the  gallbladder, the fatty tissue of the  hepatocystic triangle was completed  cleared and the distal 1/3 of the  gallbladder was lifted off of the cystic  plate.   After the triangle of Calot had been carefully dissected, two clips were placed  proximally and one clip distally on the cystic duct near its junction with the  gallbladder. The cystic duct was then transected using endoscopic scissors. The  cystic artery was clipped twice proximally and once distally. Scissors were then  introduced and the cystic artery was divided. The gallbladder was then removed  up away from the liver bed using  electrocautery. During this process there was  minimal spillage of some bile with no stones. This was extensively irrigated ans  suctioned. The gallbladder was easily removed through the supraumbilical port  site. All dissection areas were inspected. They were hemostatic. There was no  bile leakage. All clips were in place. All remaining ports were then removed  under direct vision. The abdominal cavity was allowed to deflate. The fascia at  the supraumbilical port was closed by placing a #0 Vicryl suture. All skin  incisions were then closed with subcuticular sutures of 4-0 Monocryl. The  patient tolerated the procedure well. Our attention was then turned to the  Anesthesia team who were able to extubate the patient without any problems. I  was present and scrubbed during the entire case.

## 2021-10-20 NOTE — H&P (Signed)
H&P reviewed. The patient was examined and there are no changes to the H&P.

## 2021-10-22 ENCOUNTER — Encounter: Payer: PRIVATE HEALTH INSURANCE | Attending: Internal Medicine | Primary: Internal Medicine

## 2021-10-23 LAB — SURGICAL PATHOLOGY EXAM

## 2021-11-01 ENCOUNTER — Inpatient Hospital Stay: Admit: 2021-11-01 | Primary: Internal Medicine

## 2021-11-01 ENCOUNTER — Ambulatory Visit: Admit: 2021-11-01 | Discharge: 2021-11-01 | Attending: Registered Nurse | Primary: Internal Medicine

## 2021-11-01 DIAGNOSIS — R1011 Right upper quadrant pain: Secondary | ICD-10-CM

## 2021-11-01 NOTE — Result Quicknote (Signed)
Sent portal message regarding results, please help schedule limited abd [place], thanks

## 2021-11-01 NOTE — Addendum Note (Signed)
Addended by: Emily Filbert on: 11/02/2021 09:51 AM     Modules accepted: Orders

## 2021-11-01 NOTE — Progress Notes (Signed)
[facility] [address]  [facility] [address]  [address]  [place] [id number]  Dept: [phone]  Dept Fax: [phone]     [facility] [address]  [facility] [address]  [address]  [place] [phone]  Dept: [phone]  Dept Fax: [phone]     Patient ID: Timothy Morris is a 51 y.o. male who presents for Flank Pain (Right ).    Subjective   Pain on right side after gall bladder surgery  10/20/21    I had gallbladder surgery everything was going well until last thurs or friday and every time he takes a deep breath he gets sharp pain in his right side under ribs, avoiding fatty foods, pooping ok , every time I eat I feel bloated, like overstuffed,  Low grade fever of 100 on saturday he felt warm, Denies nausea or vomiting,           Objective   Visit Vitals  BP 112/71 (BP Location: Left arm, Patient Position: Sitting, BP Cuff Size: Large adult)   Pulse 96   Temp 36.7 C (98.1 F) (Temporal)   Ht 1.791 m   Wt 108.4 kg   SpO2 97%   BMI 33.81 kg/m   BSA 2.32 m       Physical Exam  Vitals reviewed.   Constitutional:       Appearance: Normal appearance.   Cardiovascular:      Rate and Rhythm: Normal rate and regular rhythm.   Pulmonary:      Effort: Pulmonary effort is normal.      Breath sounds: Normal breath sounds.   Abdominal:      General: Abdomen is flat.      Palpations: Abdomen is soft.      Tenderness: There is abdominal tenderness in the right upper quadrant.      Comments: No rebound tenderness or masses   Musculoskeletal:      Cervical back: Normal range of motion.   Skin:     General: Skin is warm and dry.   Neurological:      Mental Status: He is alert.   Psychiatric:         Mood and Affect: Mood normal.         Assessment/Plan   Timothy Morris was seen today for flank pain.  RUQ pain  Comments:  recent surgery and concern for constipation/obstipation  check KUB  f/u after results  Orders:  -     XR ABDOMEN 2 VIEWS; Future  -     CBC  -     Comprehensive  metabolic panel  -     Lipase  -     Amylase

## 2021-11-02 LAB — CBC
Hematocrit: 42.6 % (ref 37.0–53.0)
Hemoglobin: 15.1 g/dL (ref 13.0–17.5)
MCH: 31.9 pg (ref 26.0–34.0)
MCHC: 35.4 g/dL (ref 31.0–37.0)
MCV: 89.9 fL (ref 80.0–100.0)
MPV: 12.1 fL (ref 9.1–12.4)
NRBC %: 0 % (ref 0.0–0.0)
NRBC Absolute: 0 10*3/uL (ref 0.00–2.00)
Platelets: 241 10*3/uL (ref 150–400)
RBC: 4.74 M/uL (ref 4.20–5.90)
RDW-CV: 13.2 % (ref 11.5–14.5)
RDW-SD: 43.4 fL (ref 35.0–51.0)
WBC: 8.7 10*3/uL (ref 4.0–11.0)

## 2021-11-02 LAB — COMPREHENSIVE METABOLIC PANEL
ALT: 104 U/L — ABNORMAL HIGH (ref 0–55)
AST: 52 U/L — ABNORMAL HIGH (ref 6–42)
Albumin: 4 g/dL (ref 3.2–5.0)
Alkaline phosphatase: 62 U/L (ref 30–130)
Anion Gap: 9 mmol/L (ref 3–14)
BUN: 20 mg/dL (ref 6–24)
Bilirubin, total: 0.5 mg/dL (ref 0.2–1.2)
CO2 (Bicarbonate): 29 mmol/L (ref 20–32)
Calcium: 9.6 mg/dL (ref 8.5–10.5)
Chloride: 97 mmol/L — ABNORMAL LOW (ref 98–110)
Creatinine: 1.26 mg/dL (ref 0.55–1.30)
Glucose: 93 mg/dL (ref 70–139)
Potassium: 3.9 mmol/L (ref 3.6–5.2)
Protein, total: 8.3 g/dL (ref 6.0–8.4)
Sodium: 135 mmol/L (ref 135–146)
eGFRcr: 68 mL/min/{1.73_m2} (ref >=60)

## 2021-11-02 LAB — LIPASE: Lipase: 38 U/L (ref 13–75)

## 2021-11-02 LAB — AMYLASE: Amylase: 92 U/L (ref 23–125)

## 2021-11-02 NOTE — Telephone Encounter (Signed)
Is order already put through? Please advise

## 2021-11-02 NOTE — Progress Notes (Signed)
Wrong test ordered, corrected

## 2021-11-02 NOTE — Telephone Encounter (Signed)
Order in place, pt aware

## 2021-11-02 NOTE — Telephone Encounter (Signed)
-----   Message from Shauna Hugh, NP sent at 11/02/2021  9:51 AM EDT -----  Sent portal message regarding results, please help schedule limited abd [place], thanks

## 2021-11-09 ENCOUNTER — Ambulatory Visit: Primary: Internal Medicine

## 2021-11-10 NOTE — Telephone Encounter (Signed)
Procedure Date: 11/17/2021    Procedure:   L5-S1 Interlaminar Epidural Steroid Injection     Reviewed medications:  NO ANTICOAGULANTS IDENTIFIED     Med Hold:    No      Contrast Allergy:   No     Sent letter to cardiology/PCP:  No     Skip Mayerimothy Tierney, MD, MD     Sent patient Pre-Procedure instructions:   MyChart    Hillis RangeMonique I Khaleed Holan, LPN

## 2021-11-12 NOTE — Telephone Encounter (Signed)
OON Referral Information  Patient is requesting an out of system specialist visit.    Explain patient's medical problem:pancreatic PAIN  Has patient seen/spoken with PCP for this problem?NO  [facility] problem?  Specialist Hospital system?              Specialist RUE:[phone]  ERIC Ethelene BrownsANTHONY ARDOLINO M.D.  Does patient have a past established relationship with this specialist or this practice?         YES       Estimated last past visit:   Number of visits authorized:         3           Appointment date for this request:11/12/2021  Any other info:COC AFTER ER VISIT  Valinda HoarFAX #[phone]  Document which member of your team has educated patient that:  In the future, this PCP prefers that their patients choose a specialist by having a discussion with PCP team first.  Our preferred specialists [facility] :[place]    For PCP TMP Comments:

## 2021-11-17 ENCOUNTER — Ambulatory Visit: Admit: 2021-11-17 | Discharge: 2021-11-17 | Attending: Physical Medicine & Rehabilitation | Primary: Internal Medicine

## 2021-11-17 DIAGNOSIS — M5416 Radiculopathy, lumbar region: Secondary | ICD-10-CM

## 2021-11-17 NOTE — Progress Notes (Signed)
 [address]  [place], [place] [address]    [address]  [place], [place] [address]    Phone: [phone]  Fax:      [phone]  E-mail: [email or web address]        PATIENT NAME: Timothy Morris   PATIENT DOB:  December 10, 1970   PCP:   [name], MD, MD    Vitals:    11/17/21 1103   BP: 130/80        PROCEDURE NOTE  FLUOROSCOPICALLY GUIDED LUMBAR L5-S1 INTERLAMINAR EPIDURAL STEROID INJECTION  The procedure was explained. Risks, benefits and alternatives were explained. A consent form was signed. The patient was placed prone on the exam table. The area overlying the lumbosacral spine was exposed and prepared using a chlorhexidine swabs. This area was then allowed to dry and draped. The entry point overlying the L5-S1 interlaminar area was identified using both anatomic landmarks and fluoroscopy. A skin wheal was raised at the entry point location using preservative free 1% lidocaine without epinephrine delivered via a 1.5" 2g skin needle. The needle was then inserted and directed toward the interlaminar area. As the needle was withdrawn from each location, 1 mL of preservative free 1% lidocaine without epinephrine was injected. A 20g 3.5" Tuohy was then inserted and directed toward the interlaminar area using intermittent fluoroscopy in AP, lateral, and 50 degree contralateral oblique views. The stylus was removed and a loss of resistance syringe was connected to the needle. Then, using the loss of resistance technique to saline the needle was advanced to the epidural space. The location was confirmed through injection of Omnipaque 240 mg/mL AP, lateral, and 50 degree contralateral oblique views were saved to the PACS, as appropriate. Then, 2 mL of normal saline mixed with {1 mL of 40 mg/mL methylprednisolone was injected into the epidural space. The needle was then withdrawn. The skin was cleansed and a sterile adhesive bandage was placed overlying the entry site. All aspirates were negative.  There were no immediate complications. The patient ambulated to the recovery area without difficulty.      Recovery and Discharge: After the procedure was completed, Timothy Morris was transferred to the recovery area and observed by our recovery staff. The discharge instructions and post procedural pain logs were reviewed.  Red flag findings were discussed with the patient as a reason for emergent evaluation.      Complications:  No immediate procedural complication noted.    Follow-up:  14-28 days post procedure.         Lumbar Epidural Steroid Injection    Date/Time: 11/17/2021 11:14 AM    Performed by: Jerilee Field, MD  Authorized by: Jerilee Field, MD    Staff:     Performed with:  Support staff  Consent:     Consent obtained:  Written    Risks, benefits, and alternatives were discussed: yes      Procedural risks discussed: bleeding, infection, allergy, death, paralysis, and/or spinal headache.    Alternatives discussed:  No treatment, referral, delayed treatment and alternative treatment  Universal protocol:     Procedure explained and questions answered to patient or proxy's satisfaction: yes      Imaging studies available: yes      Site/side marked: yes      Immediately prior to procedure, a time out was called: yes      Patient identity confirmed:  Verbally with patient and provided demographic data  Indications:     Indications:  Pain relief  Pre-procedure details:     Skin preparation:  Chlorhexidine    Preparation: Patient was prepped and draped in usual sterile fashion    Skin anesthesia:     Skin anesthesia method:  Local infiltration    Local anesthetic:  Lidocaine 1% w/o epi  Procedure details:     Block needle gauge:  18 G    Needle length (in):  3.5    Guidance: fluoroscopy      Anesthetic injected:  Lidocaine 1% w/o epi    Steroid injected:  Methylprednisolone    Injection procedure:  Anatomic landmarks identified and negative aspiration for blood    Paresthesia:  None  Post-procedure details:     Dressing:   Sterile dressing    Procedure completion:  Tolerated

## 2021-11-26 LAB — HEPATIC FUNCTION PANEL
ALBUMIN, BLOOD: 4.2 g/dL (ref 3.4–5.0)
ALK PHOSPHATASE: 53 IU/L (ref 40–150)
ALT (SGPT): 99 IU/L — ABNORMAL HIGH (ref 0–55)
AST (SGOT): 53 IU/L — ABNORMAL HIGH (ref 15–37)
DIRECT BILIRUBIN: 0.3 mg/dL (ref 0.0–0.5)
GLOBULIN: 3.8 g/dL (ref 2.5–4.3)
TOTAL BILIRUBIN: 0.8 mg/dL (ref 0.2–1.2)
TOTAL PROTEIN: 8 g/dL (ref 6.1–8.2)

## 2021-12-01 ENCOUNTER — Ambulatory Visit: Admit: 2021-12-01 | Discharge: 2021-12-01 | Attending: Physical Medicine & Rehabilitation | Primary: Internal Medicine

## 2021-12-01 DIAGNOSIS — M5416 Radiculopathy, lumbar region: Secondary | ICD-10-CM

## 2021-12-01 NOTE — Progress Notes (Signed)
[address]  [place], [place] [address]    [address]  [place], [place] [address]    Phone: [phone]  Fax:      [phone]  E-mail: [email or web address]         PATIENT NAME:  Timothy Morris   PATIENT DOB:  25-Dec-1970   PCP:   Timothy Mayer, MD, MD   PROVIDER:  Jerilee Field, MD  ENCOUNTER DATE: 12/01/2021    Chief complaint/History of Present Illness:  [name] is a pleasant 51 y.o. year old male who presents for ongoing evaluation of his Low Back pain.    [name] rates his pain as 7/10 VAS at its worst.  The pain is described as sharp and can be exacerbated by standing and sleeping in a bad position while alleviated by rest.    Thus far, the patient has tried Lumbar L5-S1 IL ESI on 11/17/2021 that provided  70% relief until Monday of this week when the pain returned and has been intermittently elevated.  He feels like this has been the best relief that he has found has been from this epidural.       History:  Past Medical History:   Diagnosis Date   . Erectile dysfunction    . Fatty liver    . History of gout    . Primary hypertension    . 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     Past Surgical History:   Procedure Laterality Date   . ESOPHAGOGASTRODUODENOSCOPY  2023   . INGUINAL HERNIA REPAIR     . KNEE ARTHROSCOPY W/ DEBRIDEMENT Right     x2   . LEFT COLECTOMY      Sigmoid   . ROTATOR CUFF REPAIR Right      Family History   Problem Relation Name Age of Onset   . Breast cancer Mother     . No Known Problems Sister        Social History     Socioeconomic History   . Marital status: Divorced     Spouse name: Not on file   . Number of children: 2   . Years of education: Not on file   . Highest education level: Not on file   Occupational History   . Occupation: Air cabin crew   Tobacco Use   . Smoking status: Never   . Smokeless tobacco: Never   Substance and Sexual Activity   . Alcohol use: Never   . Drug use: Yes     Types: Marijuana   . Sexual  activity: Not on file   Other Topics Concern   . Not on file   Social History Narrative   . Not on file     Social Determinants of Health     Financial Resource Strain: Not on file   Food Insecurity: Not on file   Transportation Needs: Not on file   Physical Activity: Not on file   Stress: Not on file   Social Connections: Not on file   Intimate Partner Violence: Not on file   Housing Stability: Not on file      Patient has no known allergies.    Medications:  Current Outpatient Medications   Medication Instructions   . amLODIPine-benazepriL (Lotrel) 10-40 mg capsule TAKE 1 CAPSULE BY MOUTH EVERY MORNING   . atorvastatin (Lipitor) 40 mg tablet TAKE 1 TABLET BY MOUTH EVERY DAY TO LOWER CHOLESTEROL   . chlorthalidone (HYGROTON)  25 mg, oral, Daily   . famotidine (PEPCID) 20 mg, oral, 2 times daily PRN       Review of Systems:  Pertinent items are noted in HPI    PHYSICAL EXAMINATION:  GENERAL: well appearing, alert and oriented x3, appropriate mood/affect in no acute distress  CVS: peripheral pulses are intact and symmetric  CHEST: symmetric chest rise, no shortness of breath  HEENT: NC/AT  SKIN: No acute lesions affecting the face, trunk or upper/lower extremities  FEET/ANKLES: no cyanosis, clubbing, edema  HEAD/NECK, SPINE/RIBS/PELVIS, UPPER LIMBS: no misalignment/instability. ROM and upper extremity muscle strength are grossly intact  GAIT/STATION: normal  SPINE: The pelvis is level.  There is tenderness to palpation bilaterally overlying the lumbosacral zygopophyseal joints and paraspinal muscles.  There is muscle spasm bilaterally in the lumbosacral paraspinal muscles.  Supine straight leg raise, femoral stretch, and Patrick's tests are negative bilaterally.  SPINE RANGE OF MOTION: There is diminished range of motion in lumbar flexion, extension, lateral flexion, and rotation with increased pain in all dimensions.  MUSCLE STRENGTH: 5/5 bilaterally in the hip flexors, hip abductors, hip adductors.  4+/5 in the LEFT  dorsiflexors while the right rates 5/5.  SENSATION: Equal and symmetric bilaterally in the L3-S1 dermatomes to pinprick sensation.  MUSCLE TENDON REFLEXES: Equal and symmetric in the quadriceps and Achilles.  No sustained clonus could be elicited either ankle.  Toes are downgoing to plantar stimuli bilaterally.  CEREBELLAR: Coordination is grossly intact  ADDITIONAL PHYSICAL EXAMINATIONS: Hip and knee maneuvers provoked no pain    Assessment/Plan:    Spinal anatomy using the patient's imaging as an example was reviewed.  Management including physical therapy, spinal injections, and surgery was discussed.  A stretching and exercise program was presented and encouraged.    Problem List Items Addressed This Visit           ICD-10-CM       Nervous    Lumbar radiculopathy - Primary M54.16    Overview     09/20/21: the patient pain has been refractory to conservative management and more advanced treatment including LEFT and RIGHT L3-4 medial branch and L5 dorsal rami radiofrequency ablation performed at least a year ago.  He has continued a daily exercise program without any long term benefit.  10/03/2021: MR, LS spine, Shields: Posterior broad-based disc bulging at the L5-S1 level, asymmetric to the left, in a position to irritate the left S1 nerve root.  10/04/21: Left-sided dorsiflexor weakness.  Absent left Achilles reflex.  11/17/2021: L5-S1 interlaminar epidural steroid injection with 70% relief for ~2 weeks.  REC: repeat L5-S1 interlaminar epidural steroid injection          Current Assessment & Plan     The patient had >60% relief from the previous injection.  The patient had improved functional mobility during the post procedural injection relief period and was able to decrease oral medication intake.  The patient strictly adhered to the previously provided supervised home exercise program, since the last injection without long term benefit.  Hence, it is medically necessary to order: L5-S1 interlaminar epidural  steroid injection #2.    Risks and benefits were explained.  All questions were answered.   The consent form will be signed immediately prior to the procedure.           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            Other    Abnormal MRI, lumbar spine R93.7    Overview     02/29/2020: MRI LS spine Shields  diffuse desiccation of the      intervertebral discs, although disc heights are grossly maintained.      The conus terminates normally at L1.        T12-L1: No significant spinal canal stenosis or neural foraminal      narrowing.      L1-2: Shallow disc bulge, without significant spinal canal stenosis      or neural foraminal narrowing.      L2-3: There is a disc bulge and bilateral facet hypertrophy as well      as borderline ligamentum flavum infolding, together resulting in mild      spinal canal stenosis and mild bilateral neural foraminal narrowing.      L3-4: There is a left paracentral to lateral disc protrusion with      annular fissure and bilateral facet arthropathy, resulting in      narrowing of the left lateral recess, mild right and moderate left      neural foraminal narrowing, with impingement of the left exiting L3      nerve root.  There is no significant spinal canal stenosis.      L4-5: There is a disc bulge and bilateral facet arthropathy,      resulting in mild bilateral neural foraminal narrowing, but no      significant spinal canal stenosis.      L5-S1: There is a disc bulge, without significant spinal canal      stenosis or neural foraminal narrowing  10/03/2021: MR, LS spine, Shields  Five non-rib-bearing lumbar type vertebra.  -  Bones:  No evidence of acute fracture.  Mild  chronic anterior wedging  at the T12 and L1 levels, unchanged.  The remaining aspects of the  vertebral body heights are preserved.  There is no suspicious marrow  replacing lesion or focal bone marrow edema identified.  -  Alignment: The usual lumbar lordosis is preserved. There is mild  retrolisthesis of L2 over L3 and L3 over L4.  -  Intervertebral discs/Joints: Multilevel lumbar spondylosis  characterized by loss of disc signal and intervertebral disc space  height in, marginal osteophyte formation, degenerative endplate  changes, disc bulging, facet hypertrophy and ligamentum flavum  thickening with significant findings by level described below.  -  Thecal sac: The conus medullaris terminates at the inferior L1 level.  The visualized distal spinal cord and cauda equina nerve roots are  unremarkable.  -  Paravertebral soft tissue: Unremarkable.  -  T12-L1:  There is no evidence of significant posterior disc  abnormality, spinal canal stenosis or neural foraminal narrowing.  -  L1-L2:  There is mild diffuse disc bulging without significant spinal  canal stenosis or neural foraminal narrowing.  -  L2-L3:  In addition to the retrolisthesis, there is diffuse disc  bulging, bilateral facet hypertrophy and effusions and ligamentum  flavum thickening resulting in mild spinal canal stenosis, mild  bilateral subarticular zone and neural foraminal narrowing.  -  L3-L4:  In addition to the retrolisthesis, there is left eccentric  disc bulging, bilateral facet hypertrophy and ligamentum flavum  thickening resulting in  mild spinal canal stenosis, mild left  subarticular zone and moderate left and mild right neural foraminal  narrowing.  -  L4-L5:  There is mild left eccentric disc bulging, bilateral facet  hypertrophy and ligamentum flavum thickening resulting in mild  bilateral neural foraminal narrowing.  No significant central spinal  canal stenosis.  -  L5-S1:  There is a posterior disc bulge without significant  spinal  canal stenosis or neural foraminal narrowing, asymmetric to the left, in a position to irritate the left S1 nerve root as it emerges from the thecal sac.

## 2021-12-01 NOTE — Assessment & Plan Note (Signed)
The patient had >60% relief from the previous injection.  The patient had improved functional mobility during the post procedural injection relief period and was able to decrease oral medication intake.  The patient strictly adhered to the previously provided supervised home exercise program, since the last injection without long term benefit.  Hence, it is medically necessary to order: L5-S1 interlaminar epidural steroid injection #2.    Risks and benefits were explained.  All questions were answered.   The consent form will be signed immediately prior to the procedure.

## 2021-12-17 ENCOUNTER — Ambulatory Visit: Admit: 2021-12-17 | Discharge: 2021-12-17 | Attending: Internal Medicine | Primary: Internal Medicine

## 2021-12-17 DIAGNOSIS — Z23 Encounter for immunization: Secondary | ICD-10-CM

## 2021-12-17 MED ORDER — chlorthalidone (Hygroton) 25 mg tablet
25 | ORAL_TABLET | Freq: Every day | ORAL | 1 refills | 30.00000 days | Status: AC
Start: 2021-12-17 — End: 2022-01-16

## 2021-12-17 NOTE — Patient Instructions (Signed)
Limit Salt intake to 3 grams daily  Exercise at aerobic capacity 30 minutes daily   A healthy Body Mass Index is 26 or lower  Follow a Mediterranean Diet Plan or use learnhealthyfood.org

## 2021-12-17 NOTE — Progress Notes (Signed)
[facility] [address]  [facility] [address]  [address]  [place] [id number]  Dept: [phone]  Dept Fax: [phone]     Patient ID: Timothy FairlyMarc K Morris is a 51 y.o. male who presents for Follow-up (HTN).    Subjective   HPI  51 year old gentleman here for follow-up on hypertension.  At last visit 6 months ago his levels were low but high his medications were adjusted.  He has since been walking more lost a little bit of weight being much more conscientious with salt intake.  His review of systems is unremarkable.  Current Outpatient Medications   Medication Instructions   . amLODIPine-benazepriL (Lotrel) 10-40 mg capsule TAKE 1 CAPSULE BY MOUTH EVERY MORNING   . atorvastatin (Lipitor) 40 mg tablet TAKE 1 TABLET BY MOUTH EVERY DAY TO LOWER CHOLESTEROL   . chlorthalidone (HYGROTON) 25 mg, oral, Daily   . famotidine (PEPCID) 20 mg, oral, 2 times daily PRN     Past Medical History:   Diagnosis Date   . Erectile dysfunction    . Fatty liver    . History of gout    . Primary hypertension    . 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     Past Surgical History:   Procedure Laterality Date   . ESOPHAGOGASTRODUODENOSCOPY  2023   . INGUINAL HERNIA REPAIR     . KNEE ARTHROSCOPY W/ DEBRIDEMENT Right     x2   . LEFT COLECTOMY      Sigmoid   . ROTATOR CUFF REPAIR Right      Family History   Problem Relation Name Age of Onset   . Breast cancer Mother     . No Known Problems Sister       Social History     Tobacco Use   . Smoking status: Never   . Smokeless tobacco: Never   Substance Use Topics   . Alcohol use: Never   . Drug use: Yes     Types: Marijuana     Review of Systems   Constitutional: Negative.    Respiratory: Negative.    Cardiovascular: Negative.    Neurological: Negative.        Objective   Visit Vitals  BP 132/71 (BP Location: Left arm, Patient Position: Sitting, BP Cuff Size: Large adult)   Pulse 50   Temp 36.5 C (97.7 F) (Temporal)   Ht 1.791 m   Wt  105.7 kg   SpO2 96%   BMI 32.96 kg/m   BSA 2.29 m       Physical Exam  Constitutional:       Appearance: Normal appearance. He is obese.   Cardiovascular:      Rate and Rhythm: Normal rate and regular rhythm.   Pulmonary:      Effort: Pulmonary effort is normal.      Breath sounds: Normal breath sounds.   Neurological:      General: No focal deficit present.         Assessment/Plan   Timothy Morris was seen today for follow-up.  Need for shingles vaccine  Primary hypertension    Today's blood pressure is within goal limit per [facility] guidelines.  Compliant with medications.    Encouraged to maintain salt discipline and strive to lose a bit more weight if BMI above 28.  Encouraged to check home blood pressures using the American Heart Association handout which is provided and bring diary of readings to  next visit.      Follow-up in 6 months

## 2022-04-21 ENCOUNTER — Ambulatory Visit: Admit: 2022-04-21 | Discharge: 2022-04-21 | Attending: Internal Medicine | Primary: Internal Medicine

## 2022-04-21 DIAGNOSIS — I10 Essential (primary) hypertension: Secondary | ICD-10-CM

## 2022-04-21 MED ORDER — metoprolol succinate XL (Toprol-XL) 50 mg 24 hr tablet
50 | ORAL_TABLET | Freq: Every day | ORAL | 3 refills | Status: AC
Start: 2022-04-21 — End: 2022-10-18

## 2022-04-21 MED ORDER — atorvastatin (Lipitor) 40 mg tablet
40 | ORAL_TABLET | Freq: Every day | ORAL | 3 refills | 90.00000 days | Status: AC
Start: 2022-04-21 — End: ?

## 2022-04-21 NOTE — Progress Notes (Signed)
[facility] [address]  [facility] [address]  [address]  [place] [address]  Dept: [phone]  Dept Fax: [phone]     Patient ID: Timothy Morris is a 51 y.o. male who presents for Follow-up.    Subjective   HPI  51 year old gentleman complaining of afternoon headaches elevated blood pressures for the last 3 months.  Patient states he has not been taking chlorthalidone which was added 6 months ago for blood better blood pressure control.  Patient denies chest pain shortness of breath or any other red flag signs or symptoms.  Current Outpatient Medications   Medication Instructions   . amLODIPine-benazepriL (Lotrel) 10-40 mg capsule TAKE 1 CAPSULE BY MOUTH EVERY MORNING   . atorvastatin (LIPITOR) 40 mg, oral, Daily, To lower cholesterol   . famotidine (PEPCID) 20 mg, oral, 2 times daily PRN          Past Medical History:   Diagnosis Date   . Fatty liver    . History of gout    . Primary hypertension      Past Surgical History:   Procedure Laterality Date   . ESOPHAGOGASTRODUODENOSCOPY  2023   . INGUINAL HERNIA REPAIR     . KNEE ARTHROSCOPY W/ DEBRIDEMENT Right     x2   . LEFT COLECTOMY      Sigmoid   . ROTATOR CUFF REPAIR Right      Family History   Problem Relation Name Age of Onset   . Breast cancer Mother     . No Known Problems Sister       Social History     Tobacco Use   . Smoking status: Never   . Smokeless tobacco: Never   Substance Use Topics   . Alcohol use: Never   . Drug use: Yes     Types: Marijuana     Review of Systems   Constitutional: Negative.    Respiratory: Negative.    Cardiovascular: Negative.    Genitourinary: Negative.    Musculoskeletal: Negative.    Neurological: Positive for headaches. Negative for dizziness, tremors, syncope and numbness.       Objective   Visit Vitals  BP (!) 157/85 (BP Location: Left arm, Patient Position: Sitting, BP Cuff Size: Large adult)   Pulse 57   Ht 1.791 m   Wt 106.1 kg   SpO2 97%   BMI 33.10 kg/m   BSA 2.3 m       Physical  Exam  Constitutional:       Appearance: Normal appearance.   Cardiovascular:      Rate and Rhythm: Normal rate and regular rhythm.   Pulmonary:      Effort: Pulmonary effort is normal.      Breath sounds: Normal breath sounds.   Neurological:      General: No focal deficit present.      Cranial Nerves: No cranial nerve deficit.      Sensory: No sensory deficit.         Assessment/Plan   Timothy Morris was seen today for follow-up.  Primary hypertension/headache  Today's blood pressure was high.  Patient's headache syndrome may be due to hypertension?.  In any event will add beta-blocker to his current medications he is encouraged to maintain salt discipline and strive to lose a bit more weight if BMI above 28.  Encouraged to check home blood pressures using the American Heart Association handout which is provided and bring diary of readings to  next visit.  -     metoprolol succinate XL (Toprol-XL) 50 mg 24 hr tablet; Take 1 tablet (50 mg) by mouth once daily. Do not crush or chew.  Screening for HIV without presence of risk factors  -     HIV Ab/Ag screen  Need for shingles vaccine  -     refused  Needs flu shot  -    refused  Mixed hyperlipidemia  -     Restart  atorvastatin (Lipitor) 40 mg tablet; Take 1 tablet (40 mg) by mouth once daily. To lower cholesterol      Patient Health Questionnaire-2 Score: 0 Interpretation: Negative screening.      Follow-up & Interventions: Maintain annual screening - No additional Follow-up required      Follow-up in 3 months

## 2022-04-22 LAB — CBC
Hct: 41.4 % (ref 37.5–51.0)
Hgb: 14.8 g/dL (ref 13.0–17.7)
MCH: 32.7 pg (ref 26.6–33.0)
MCHC: 35.7 g/dL (ref 31.5–35.7)
MCV: 92 fL (ref 79–97)
Platelets: 176 10*3/uL (ref 150–450)
RBC: 4.52 x10E6/uL (ref 4.14–5.80)
RDW: 14 % (ref 11.6–15.4)
WBC: 5.5 10*3/uL (ref 3.4–10.8)

## 2022-04-22 LAB — BASIC METABOLIC PANEL
BUN/Creat Ratio: 20 (ref 9–20)
BUN: 18 mg/dL (ref 6–24)
Calcium: 9.6 mg/dL (ref 8.7–10.2)
Carbon Dioxide: 22 mmol/L (ref 20–29)
Chloride: 103 mmol/L (ref 96–106)
Creat: 0.92 mg/dL (ref 0.76–1.27)
Glucose: 127 mg/dL — ABNORMAL HIGH (ref 70–99)
Potassium: 4 mmol/L (ref 3.5–5.2)
Sodium: 138 mmol/L (ref 134–144)
eGFR: 99 mL/min/{1.73_m2} (ref >59)

## 2022-04-22 LAB — LIPID PANEL
Cholesterol: 204 mg/dL — ABNORMAL HIGH (ref 100–199)
HDL Cholesterol: 26 mg/dL — ABNORMAL LOW (ref >39)
LDLc Calc (NIH): 94 mg/dL (ref 0–99)
Non-HDL Chol: 178 mg/dL — ABNORMAL HIGH (ref 0–129)
Triglycerides: 504 mg/dL — ABNORMAL HIGH (ref 0–149)
VLDLc Calc: 84 mg/dL — ABNORMAL HIGH (ref 5–40)

## 2022-04-22 LAB — HIV AB/AG SCREEN: HIV Scr 4th Gen: NONREACTIVE

## 2022-04-25 NOTE — Telephone Encounter (Signed)
Appointment:        Date of patient's next encounter in the current department:  06/20/2022   Date of patient's next encounter with the current provider:  06/20/2022   Date of patient's last encounter in the current department: 04/21/2022      Last BP:   BP Readings from Last 3 Encounters:   04/21/22 (!) 157/85   12/17/21 132/71   11/17/21 130/80       Last HGBA1C: No results found for: HGBA1C    Labs  No results found for: TSH  Lab Results   Component Value Date    CALCIUM 9.6 04/21/2022    ALBUMIN 4.2 11/26/2021    NA 138 04/21/2022    K 4.0 04/21/2022    CO2 22 04/21/2022    CL 103 04/21/2022    BUN 18 04/21/2022    CREATININE 0.92 04/21/2022      AST   Date Value Ref Range Status   11/01/2021 52 (H) 6 - 42 U/L Final   11/16/2020 35 6 - 42 U/L Final     AST (SGOT)   Date Value Ref Range Status   11/26/2021 53 (H) 15 - 37 IU/L Final     ALT   Date Value Ref Range Status   11/01/2021 104 (H) 0 - 55 U/L Final   11/16/2020 64 (H) 0 - 55 U/L Final     ALT (SGPT)   Date Value Ref Range Status   11/26/2021 99 (H) 0 - 55 IU/L Final     Comment:     Lab Director: Latanya Presser     No components found for: TBILI  Hemoglobin   Date Value Ref Range Status   11/01/2021 15.1 13.0 - 17.5 g/dL Final     Hgb   Date Value Ref Range Status   04/21/2022 14.8 13.0 - 17.7 g/dL Final     Hematocrit   Date Value Ref Range Status   11/01/2021 42.6 37.0 - 53.0 % Final     Hct   Date Value Ref Range Status   04/21/2022 41.4 37.5 - 51.0 % Final     Platelets   Date Value Ref Range Status   04/21/2022 176 150 - 450 x10E3/uL Final   11/01/2021 241 150 - 400 K/uL Final     No results found for: PTT  No results found for: INR  No results found for: PROTIME  No results found for: PTADJUSTED    Last Urine Drug Screen: on .  Narcotic Medications:    Directives/Controlled Substance Agreement   PMP Appropriate __YES      ___No

## 2022-06-20 ENCOUNTER — Ambulatory Visit
Admit: 2022-06-20 | Discharge: 2022-06-20 | Payer: PRIVATE HEALTH INSURANCE | Attending: Internal Medicine | Primary: Internal Medicine

## 2022-06-20 DIAGNOSIS — Z0000 Encounter for general adult medical examination without abnormal findings: Secondary | ICD-10-CM

## 2022-06-20 NOTE — Progress Notes (Signed)
[facility] [address]  [facility] [address]  [address]  [place] [address]  Dept: [phone]  Dept Fax: [phone]     Patient ID: Timothy Morris is a 52 y.o. male who presents for Annual Exam.    Subjective   HPI  52 yo male for annual check up  Labs done recently all WNL.  ROS: stress due to mother's illness now in hospice    Patient Active Problem List   Diagnosis   . Primary hypertension   . Hyperlipidemia   . Fatty liver   . Degenerative disc disease, lumbar   . Class 1 obesity due to excess calories with serious comorbidity and body mass index (BMI) of 31.0 to 31.9 in adult   . Lumbosacral spondylosis without myelopathy   . Cervical radiculopathy   . History of gout     Current Outpatient Medications   Medication Instructions   . amLODIPine-benazepriL (Lotrel) 10-40 mg capsule 1 capsule, oral, Every morning   . atorvastatin (LIPITOR) 40 mg, oral, Daily, To lower cholesterol   . famotidine (PEPCID) 20 mg, oral, 2 times daily PRN   . metoprolol succinate XL (TOPROL-XL) 50 mg, oral, Daily, Do not crush or chew.     Past Surgical History:   Procedure Laterality Date   . ESOPHAGOGASTRODUODENOSCOPY  2023   . INGUINAL HERNIA REPAIR     . KNEE ARTHROSCOPY W/ DEBRIDEMENT Right     x2   . LEFT COLECTOMY      Sigmoid   . ROTATOR CUFF REPAIR Right      Family History   Problem Relation Name Age of Onset   . Breast cancer Mother     . No Known Problems Sister       Social History     Tobacco Use   . Smoking status: Never   . Smokeless tobacco: Never   Substance Use Topics   . Alcohol use: Never   . Drug use: Yes     Types: Marijuana     Review of Systems   Eyes: Negative.    Respiratory: Negative.    Cardiovascular: Negative.    Gastrointestinal: Positive for abdominal distention and nausea. Negative for abdominal pain, anal bleeding, blood in stool, constipation and diarrhea.       Objective   Visit Vitals  BP 129/89 (BP Location: Left arm, Patient Position: Sitting, BP Cuff Size: Adult)    Pulse 69   Ht 1.791 m   Wt 105.7 kg   SpO2 97%   BMI 32.96 kg/m   BSA 2.29 m       Physical Exam  Vitals reviewed.   Constitutional:       Appearance: Normal appearance.   HENT:      Head: Normocephalic.   Cardiovascular:      Rate and Rhythm: Normal rate and regular rhythm.      Heart sounds: No murmur heard.  Pulmonary:      Breath sounds: Normal breath sounds.   Abdominal:      Palpations: Abdomen is soft.   Neurological:      General: No focal deficit present.      Mental Status: He is alert.   Psychiatric:         Mood and Affect: Mood normal.         Assessment/Plan   Timothy Morris was seen today for annual exam.  Preventative health care  Patient histories, medications, diagnoses, providers, as well as functional  status, mental status,HRA, educational needs and barriers reviewed.Follow up in one year for annual wellness exam  Patient will follow up for acute, ongoing and chronic issues as scheduled and needed.  Stress related GI issues due to mother's health ; suggested bland diet trial PPI     Patient Health Questionnaire-2 Score: 2 Interpretation: Negative screening.      Follow-up & Interventions: Maintain annual screening - No additional Follow-up required        Follow up in 6 months

## 2022-06-20 NOTE — Patient Instructions (Addendum)
Omeprazole    Limit Salt intake to 3 grams daily  Exercise at aerobic capacity 30 minutes daily   A healthy Body Mass Index is 26 or lower  Follow a Mediterranean Diet Plan or use learnhealthyfood.org

## 2022-06-29 NOTE — Telephone Encounter (Signed)
LVM to call back and hit option 3 to set up f/u with Dr Dagoberto Reef

## 2022-07-01 NOTE — Telephone Encounter (Signed)
LVM x2  returning pt phone call no answer...left extension [phone] to call me back

## 2022-07-06 ENCOUNTER — Ambulatory Visit: Admit: 2022-07-06 | Discharge: 2022-07-06 | Attending: Physical Medicine & Rehabilitation | Primary: Internal Medicine

## 2022-07-06 ENCOUNTER — Encounter: Attending: Physical Medicine & Rehabilitation | Primary: Internal Medicine

## 2022-07-06 DIAGNOSIS — M5416 Radiculopathy, lumbar region: Secondary | ICD-10-CM

## 2022-07-06 NOTE — Progress Notes (Signed)
[address]  [place], [place] [address]    [address]  [place], [place] [address]    Phone: [phone]  Fax:      [phone]  E-mail: [email or web address]         PATIENT NAME:  Timothy Morris   PATIENT DOB:  1971-05-05   PCP:   Dierdre Highman, MD   PROVIDER:  Jeffie Pollock, MD   ENCOUNTER DATE: 07/06/2022    Chief complaint/History of Present Illness:  Timothy Morris is a pleasant 52 y.o. year old male who presents for a follow up evaluation of his lower back pain without radiation    Timothy Morris rates his pain as 7/10 VAS at its worst. The pain is described as sharp and can be exacerbated by standing and sleeping in a bad position while alleviated by none.    Timothy Morris has had a L5-S1 interlaminar epidural steroid injection on 11/17/21 with provided 70% relief for only a few weeks.     After his most recent visit on 6/21 another interlaminar epidural steroid injection was ordered however he never heard back regarding the status of the injection.     Timothy Morris has tried physical therapy which was ineffective in reducing his pain. He is not taking any medications. He uses a heating pad in the morning but reports this in ineffective.   Timothy Morris also continues a supervised home exercise program and oral medications without finding any significant long-term benefit from this.       History:  Past Medical History:   Diagnosis Date   . Fatty liver    . History of gout    . Primary hypertension      Past Surgical History:   Procedure Laterality Date   . ESOPHAGOGASTRODUODENOSCOPY  2023   . INGUINAL HERNIA REPAIR     . KNEE ARTHROSCOPY W/ DEBRIDEMENT Right     x2   . LEFT COLECTOMY      Sigmoid   . ROTATOR CUFF REPAIR Right      Family History   Problem Relation Name Age of Onset   . Breast cancer Mother     . No Known Problems Sister        Social History     Socioeconomic History   . Marital status: Divorced     Spouse name: Not on file   . Number of children: 2   . Years of education: Not on file   . Highest education level:  Not on file   Occupational History   . Occupation: [employer]   Tobacco Use   . Smoking status: Never   . Smokeless tobacco: Never   Substance and Sexual Activity   . Alcohol use: Never   . Drug use: Yes     Types: Marijuana   . Sexual activity: Not on file   Other Topics Concern   . Not on file   Social History Narrative   . Not on file     Social Determinants of Health     Financial Resource Strain: Not on file   Food Insecurity: Not on file   Transportation Needs: Not on file   Physical Activity: Not on file   Stress: Not on file   Social Connections: Not on file   Intimate Partner Violence: Not on file   Housing Stability: Not on file      Patient has no known allergies.    Medications:  Current Outpatient Medications  Medication Instructions   . amLODIPine-benazepriL (Lotrel) 10-40 mg capsule 1 capsule, oral, Every morning   . atorvastatin (LIPITOR) 40 mg, oral, Daily, To lower cholesterol   . famotidine (PEPCID) 20 mg, oral, 2 times daily PRN   . metoprolol succinate XL (TOPROL-XL) 50 mg, oral, Daily, Do not crush or chew.       Review of Systems:  Pertinent items are noted in HPI    PHYSICAL EXAMINATION:  GENERAL: well appearing, alert and oriented x3, appropriate mood/affect in no acute distress  CHEST: symmetric chest rise, no shortness of breath  HEENT: NC/AT  SKIN: No acute lesions affecting the face  CEREBELLAR: Coordination is grossly intact  STRENGTH: 5/5 bilaterally in the quadriceps.  4+/5 in the left dorsi and plantar flexors while the right rate 5/5.  REFLEXES: Equal and symmetric MTR bilaterally in the Achilles and quadriceps.  Clonus could be elicited in either ankle.    Assessment/Plan:    Spinal anatomy using the patient's imaging as an example was reviewed.  Management including physical therapy, spinal injections, and surgery was discussed.  A stretching and exercise program was presented and encouraged.    Problem List Items Addressed This Visit           ICD-10-CM        Nervous    Lumbar radiculopathy - Primary M54.16    Overview     09/20/21: the patient pain has been refractory to conservative management and more advanced treatment including LEFT and RIGHT L3-4 medial branch and L5 dorsal rami radiofrequency ablation performed at least a year ago.  He has continued a daily exercise program without any long term benefit.  10/03/2021: MR, LS spine, [place]: Posterior broad-based disc bulging at the L5-S1 level, asymmetric to the left, in a position to irritate the left S1 nerve root.  10/04/21: Left-sided dorsiflexor weakness.  Absent left Achilles reflex.  11/17/2021: L5-S1 interlaminar epidural steroid injection with 70% relief for ~2 weeks.  REC: repeat L5-S1 interlaminar epidural steroid injection   07/06/22: ongoing LEFT sided radiating low back pain.  Above ordered interlaminar epidural steroid injection was never performed or approved.  EXAM: LEFT DF/PF 4+/5 strength.  REC: L5-S1 interlaminar epidural steroid injection          Current Assessment & Plan     The patient had >60% relief from the previous injection.  The patient had improved functional mobility during the post procedural injection relief period and was able to decrease oral medication intake.  The patient strictly adhered to the previously provided supervised home exercise program, since the last injection without long term benefit.  Hence, it is medically necessary to order: LEFT para central L5-S1 interlaminar epidural steroid injection.    Risks and benefits were explained.  All questions were answered.   The consent form will be signed immediately prior to the procedure.             Relevant Orders    Lumbar Epidural Steroid Injection       Other    Abnormal MRI, lumbar spine R93.7    Overview     10/03/21: MRI lumbar spine, Timothy Morris    FINDINGS:  -  Five non-rib-bearing lumbar type vertebra.  -  Bones:  No evidence of acute fracture.  Mild chronic anterior wedging  at the T12 and L1 levels, unchanged.  The  remaining aspects of the  vertebral body heights are preserved.  There is no suspicious marrow  replacing lesion or focal bone  marrow edema identified.  -  Alignment: The usual lumbar lordosis is preserved. There is mild  retrolisthesis of L2 over L3 and L3 over L4.  -  Intervertebral discs/Joints: Multilevel lumbar spondylosis  characterized by loss of disc signal and intervertebral disc space  height in, marginal osteophyte formation, degenerative endplate  changes, disc bulging, facet hypertrophy and ligamentum flavum  thickening with significant findings by level described below.  -  Thecal sac: The conus medullaris terminates at the inferior L1 level.  The visualized distal spinal cord and cauda equina nerve roots are  unremarkable.  -  Paravertebral soft tissue: Unremarkable.  -  T12-L1:  There is no evidence of significant posterior disc  abnormality, spinal canal stenosis or neural foraminal narrowing.  -  L1-L2:  There is mild diffuse disc bulging without significant spinal  canal stenosis or neural foraminal narrowing.  -  L2-L3:  In addition to the retrolisthesis, there is diffuse disc  bulging, bilateral facet hypertrophy and effusions and ligamentum  flavum thickening resulting in mild spinal canal stenosis, mild  bilateral subarticular zone and neural foraminal narrowing.  -  L3-L4:  In addition to the retrolisthesis, there is left eccentric  disc bulging, bilateral facet hypertrophy and ligamentum flavum  thickening resulting in mild spinal canal stenosis, mild left  subarticular zone and moderate left and mild right neural foraminal  narrowing.  -  L4-L5:  There is mild left eccentric disc bulging, bilateral facet  hypertrophy and ligamentum flavum thickening resulting in mild  bilateral neural foraminal narrowing.  No significant central spinal  canal stenosis.  -  L5-S1:  There is a posterior disc bulge without significant spinal  canal stenosis or neural foraminal narrowing.        Other Visit  Diagnoses       Codes    Back pain     M54.9           Total time spent (excluding billable services): 30 minutes for this follow visit.      Preparing to see the patient.       [place] review and reconciliation of medical/surgical history, medications, review of systems,       Time the provider spends taking a patient's history.      Performing a medically appropriate exam and/or evaluation.      Counseling and educating the patient/family/caregiver.      Ordering medications, tests, or procedures.      Referring and communicating with other healthcare professionals.      Time spent documenting clinical information.      Independent interpretation of results and communicating results to the patient/family/caregiver and care coordination       Debera Lat, MD, [facility]  Physical Medicine & Rehabilitation, [facility]  Assistant Clinical Professor, [facility]

## 2022-07-06 NOTE — Assessment & Plan Note (Signed)
The patient had >60% relief from the previous injection.  The patient had improved functional mobility during the post procedural injection relief period and was able to decrease oral medication intake.  The patient strictly adhered to the previously provided supervised home exercise program, since the last injection without long term benefit.  Hence, it is medically necessary to order: LEFT para central L5-S1 interlaminar epidural steroid injection.    Risks and benefits were explained.  All questions were answered.   The consent form will be signed immediately prior to the procedure.

## 2022-07-29 NOTE — Telephone Encounter (Signed)
Procedure Date: 08/08/22    Procedure:   L5-S1 Interlaminar Epidural Steroid Injection     Medications to be held:  NONE     Clearance letter sent to cardiology/PCP:   No     Dierdre Highman, MD    Contrast Allergy:   NO       Pre-Procedure instructions sent via:   MyChart

## 2022-08-08 ENCOUNTER — Ambulatory Visit: Admit: 2022-08-08 | Discharge: 2022-08-08 | Attending: Physical Medicine & Rehabilitation | Primary: Internal Medicine

## 2022-08-08 DIAGNOSIS — M5416 Radiculopathy, lumbar region: Secondary | ICD-10-CM

## 2022-08-08 NOTE — Progress Notes (Signed)
[address]  [place], [place] [address]    [address]  [place],  [address]    Phone: [phone]  Fax:      [phone]  E-mail: [email or web address]        PATIENT NAME: Timothy Morris   PATIENT DOB:  1971-04-24   PCP:   Dierdre Highman, MD  PROCEDURALIST: Jeffie Pollock, MD  PROCEDURE DATE: 08/08/2022    Vitals:    08/08/22 1119   BP: (!) 150/90   BP Location: Right arm   Patient Position: Sitting   BP Cuff Size: Adult   Weight: 104.3 kg   Height: 1.778 m        PROCEDURE NOTE  Lumbar Epidural Steroid Injection    Performed by: Lynnell Grain, MD  Authorized by: Lynnell Grain, MD    Procedure details:   FLUOROSCOPICALLY GUIDED LUMBAR L5-S1 INTERLAMINAR EPIDURAL STEROID INJECTION  The procedure was explained. Risks, benefits and alternatives were explained. A consent form was signed. The patient was placed prone on the exam table. The area overlying the lumbosacral spine was exposed and prepared using a chlorhexidine swabs. This area was then allowed to dry and draped. The entry point overlying the L5-S1 interlaminar area was identified using both anatomic landmarks and fluoroscopy. A skin wheal was raised at the entry point location using preservative free 1% lidocaine without epinephrine delivered via a 1.5" 2g skin needle. The needle was then inserted and directed toward the interlaminar area. As the needle was withdrawn from each location, 1 mL of preservative free 1% lidocaine without epinephrine was injected. A 20g 3.5" Tuohy was then inserted and directed toward the interlaminar area using intermittent fluoroscopy in AP, lateral, and 50 degree contralateral oblique views. The stylus was removed and a loss of resistance syringe was connected to the needle. Then, using the loss of resistance technique to saline the needle was advanced to the epidural space. The location was confirmed through injection of Omnipaque 240 mg/mL AP, lateral, and 50 degree contralateral oblique views  were saved to the PACS, as appropriate. Then, 2 mL of normal saline mixed with 1 mL of 40 mg/mL methylprednisolone was injected into the epidural space. The needle was then withdrawn. The skin was cleansed and a sterile adhesive bandage was placed overlying the entry site. All aspirates were negative. There were no immediate complications. The patient ambulated to the recovery area without difficulty.      Recovery and Discharge: After the procedure was completed, [name] was transferred to the recovery area and observed by our recovery staff. The discharge instructions and post procedural pain logs were reviewed.  Red flag findings were discussed with the patient as a reason for emergent evaluation.      Complications:  No immediate procedural complication noted.    Follow-up:  14-28 days post procedure.               Jeffie Pollock, MD, [facility]  Physical Medicine & Rehabilitation, [facility]  Assistant Clinical Professor, [facility]

## 2022-08-22 ENCOUNTER — Ambulatory Visit: Admit: 2022-08-22 | Discharge: 2022-08-22 | Primary: Internal Medicine

## 2022-08-22 DIAGNOSIS — M545 Low back pain, unspecified: Secondary | ICD-10-CM

## 2022-08-22 NOTE — Progress Notes (Signed)
[address]  [place], [place] [address]     [address]  [place], [place] [address]    Phone: [phone]  Fax:      [phone]  E-mail: [email or web address]        Chief Complaint   Patient presents with   . Lower Back - Follow-up       HPI  Timothy Morris is a 52 y.o. male who presents for follow up from his most recent L5-S1 ESI with Dr. Dagoberto Reef on 2/26. Patient notes 60% relief from injection. 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 Patient states he starts the day in mild pain and then it increases throughout the day. He has to bend and use his arm as a kickstand on an elevated surface to provide relief to his low back during periods of pain. Patient states his pain is somewhat well managed by most recent ESI at this time.     Physical Exam  Height:   Weight:   BMI:        Gen: Alert, Oriented x3, No acute distress.  Stands erect, and is walking without a limp.  Head: Normocephalic, Atraumatic    Orthopedic Exam:     LUMBAR SPINE EXAMINATION- INSPECTION:  Alignment is normal.  There is no swelling.  There is no warmth.  There is no ecchymosis.  There is no erythema.  PALPATION:  There is left paraspinal tenderness.  There is no pain with flexion extension.  Good forward flexion and hyperextension.  Pain with left lateral bending.  Leg raise is -ve.  Strength is normal in bilateral lower extremities. 2      Diagnostic Tests  Narrative & Impression   EXAM DESCRIPTION:  MRI LUMBAR SPINE WO CONTRAST    CLINICAL HISTORY:  52 y/o  M with Radiculopathy, lumbar region    COMPARISON:  Lumbar spine MRI dated 02/29/2020 and CT abdomen pelvis  dated 03/22/2021.    TECHNIQUE:  An MRI of the lumbar spine was performed without  contrast.  Multiplanar imaging includes T1, T2, and STIR weighted  sequences.    FINDINGS:  -  Five non-rib-bearing lumbar type vertebra.  -  Bones:  No evidence of acute fracture.  Mild chronic anterior wedging  at the T12 and L1 levels, unchanged.   The remaining aspects of the  vertebral body heights are preserved.  There is no suspicious marrow  replacing lesion or focal bone marrow edema identified.  -  Alignment: The usual lumbar lordosis is preserved. There is mild  retrolisthesis of L2 over L3 and L3 over L4.  -  Intervertebral discs/Joints: Multilevel lumbar spondylosis  characterized by loss of disc signal and intervertebral disc space  height in, marginal osteophyte formation, degenerative endplate  changes, disc bulging, facet hypertrophy and ligamentum flavum  thickening with significant findings by level described below.  -  Thecal sac: The conus medullaris terminates at the inferior L1 level.  The visualized distal spinal cord and cauda equina nerve roots are  unremarkable.  -  Paravertebral soft tissue: Unremarkable.  -  T12-L1:  There is no evidence of significant posterior disc  abnormality, spinal canal stenosis or neural foraminal narrowing.  -  L1-L2:  There is mild diffuse disc bulging without significant spinal  canal stenosis or neural foraminal narrowing.  -  L2-L3:  In addition to the retrolisthesis, there is diffuse disc  bulging, bilateral facet hypertrophy and effusions and ligamentum  flavum  thickening resulting in mild spinal canal stenosis, mild  bilateral subarticular zone and neural foraminal narrowing.  -  L3-L4:  In addition to the retrolisthesis, there is left eccentric  disc bulging, bilateral facet hypertrophy and ligamentum flavum  thickening resulting in mild spinal canal stenosis, mild left  subarticular zone and moderate left and mild right neural foraminal  narrowing.  -  L4-L5:  There is mild left eccentric disc bulging, bilateral facet  hypertrophy and ligamentum flavum thickening resulting in mild  bilateral neural foraminal narrowing.  No significant central spinal  canal stenosis.  -  L5-S1:  There is a posterior disc bulge without significant spinal  canal stenosis or neural foraminal  narrowing.      IMPRESSION:      No significant interval change in the multilevel lumbar  spondylosis and spondylolisthesis as described, most notable at the  left L3-L4 neural foramen where there is moderate stenosis.  Additional mild degenerative changes as detailed above.       REPORT SIGNED BY:  Temple Pacini 10/04/21 14:20:12       Assessment & Plan  Lumbar pain [M54.50]  1. Lumbar pain     Patient is 14 days s/p L5-S1 ESI noting 60% relief. Patient states he is still getting relief from this injection and would not like to pursue another at this time. He will return once he starts to note more pain.     Timothy Benn, PA    10 minutes were spent with the patient taking a history, performing an examination, reviewing studies, discussing the medical problem,  recommending treatment and documenting the encounter.    Past Medical History:   Diagnosis Date   . Fatty liver    . History of gout    . Primary hypertension        Past Surgical History:   Procedure Laterality Date   . ESOPHAGOGASTRODUODENOSCOPY  2023   . INGUINAL HERNIA REPAIR     . KNEE ARTHROSCOPY W/ DEBRIDEMENT Right     x2   . LEFT COLECTOMY      Sigmoid   . ROTATOR CUFF REPAIR Right        Social History     Occupational History   . Occupation: [employer]   Tobacco Use   . Smoking status: Never   . Smokeless tobacco: Never   Substance and Sexual Activity   . Alcohol use: Never   . Drug use: Yes     Types: Marijuana   . Sexual activity: Not on file       Current Outpatient Medications   Medication Instructions   . amLODIPine-benazepriL (Lotrel) 10-40 mg capsule 1 capsule, oral, Every morning   . atorvastatin (LIPITOR) 40 mg, oral, Daily, To lower cholesterol   . famotidine (PEPCID) 20 mg, oral, 2 times daily PRN   . metoprolol succinate XL (TOPROL-XL) 50 mg, oral, Daily, Do not crush or chew.       No Known Allergies

## 2022-09-12 ENCOUNTER — Ambulatory Visit: Admit: 2022-09-12 | Discharge: 2022-09-12 | Attending: Physical Medicine & Rehabilitation | Primary: Internal Medicine

## 2022-09-12 DIAGNOSIS — M5416 Radiculopathy, lumbar region: Secondary | ICD-10-CM

## 2022-09-12 MED ORDER — predniSONE (Deltasone) 20 mg tablet
20 | ORAL_TABLET | ORAL | 0 refills | Status: AC
Start: 2022-09-12 — End: 2022-09-17

## 2022-09-12 NOTE — Progress Notes (Signed)
[address]  [place], [place] [address]    [address]  [place], [place] [address]    Phone: [phone]  Fax:      [phone]  E-mail: [email or web address]         PATIENT NAME:  Timothy ESSON   PATIENT DOB:  01/05/1971   PCP:   Timothy Mayer, MD   PROVIDER:  Debera Lat, MD   ENCOUNTER DATE: 09/12/2022    Chief complaint/History of Present Illness:  Timothy Morris is a pleasant 52 y.o. year old male who presents for a follow up evaluation of his lower back pain without radiation.    [name] hispain as 7/10 VAS at its worst. The pain is described as sharpand can be exacerbated by standing andsleeping in a bad positionwhile alleviated by none.    Most recently, Timothy Morris had a L5-S1 interlaminar epidural steroid injection on 08/08/22 with 50% relief for three weeks.    Timothy Morris states that last week he was leaning inside of his car and felt an exacerbation of his pain. Since then he states he is in agony and the back pain has been functionally limiting.            History:  Past Medical History:   Diagnosis Date   . Fatty liver    . History of gout    . Primary hypertension      Past Surgical History:   Procedure Laterality Date   . ESOPHAGOGASTRODUODENOSCOPY  2023   . INGUINAL HERNIA REPAIR     . KNEE ARTHROSCOPY W/ DEBRIDEMENT Right     x2   . LEFT COLECTOMY      Sigmoid   . ROTATOR CUFF REPAIR Right      Family History   Problem Relation Name Age of Onset   . Breast cancer Mother     . No Known Problems Sister        Social History     Socioeconomic History   . Marital status: Divorced     Spouse name: None   . Number of children: 2   . Years of education: None   . Highest education level: None   Occupational History   . Occupation: Short Order [employer]   Tobacco Use   . Smoking status: Never   . Smokeless tobacco: Never   Substance and Sexual Activity   . Alcohol use: Never   . Drug use: Yes     Types: Marijuana   . Sexual activity: None   Other Topics Concern   . None   Social History  Narrative   . None     Social Determinants of Health     Financial Resource Strain: Not on file   Food Insecurity: Not on file   Transportation Needs: Not on file   Physical Activity: Not on file   Stress: Not on file   Social Connections: Not on file   Intimate Partner Violence: Not on file   Housing Stability: Not on file      Patient has no known allergies.    Medications:  Current Outpatient Medications   Medication Instructions   . amLODIPine-benazepriL (Lotrel) 10-40 mg capsule 1 capsule, oral, Every morning   . atorvastatin (LIPITOR) 40 mg, oral, Daily, To lower cholesterol   . famotidine (PEPCID) 20 mg, oral, 2 times daily PRN   . metoprolol succinate XL (TOPROL-XL) 50 mg, oral, Daily, Do not crush or chew.   . predniSONE (Deltasone)  20 mg tablet Take 3 tablets (60 mg) by mouth with breakfast for 1 day, THEN 3 tablets (60 mg) with breakfast for 1 day, THEN 2 tablets (40 mg) with breakfast for 1 day, THEN 2 tablets (40 mg) with breakfast for 1 day, THEN 1 tablet (20 mg) with breakfast for 1 day, THEN 1 tablet (20 mg) with breakfast for 1 day.       Review of Systems:  Pertinent items are noted in HPI    PHYSICAL EXAMINATION:  GENERAL: well appearing, alert and oriented x3, appropriate mood/affect in no acute distress  CHEST: symmetric chest rise, no shortness of breath  HEENT: NC/AT  SKIN: No acute lesions affecting the face  CEREBELLAR: Coordination is grossly intact    Assessment/Plan:    Spinal anatomy using the patient's imaging as an example was reviewed.  Management including physical therapy, spinal injections, and surgery was discussed.  A stretching and exercise program was presented and encouraged.    Problem List Items Addressed This Visit           ICD-10-CM       Nervous    Lumbar radiculopathy - Primary M54.16    Overview     09/20/21: the patient pain has been refractory to conservative management and more advanced treatment including LEFT and RIGHT L3-4 medial branch and L5 dorsal rami  radiofrequency ablation performed at least a year ago.  He has continued a daily exercise program without any long term benefit.  10/03/2021: MR, LS spine, Shields: Posterior broad-based disc bulging at the L5-S1 level, asymmetric to the left, in a position to irritate the left S1 nerve root.  10/04/21: Left-sided dorsiflexor weakness.  Absent left Achilles reflex.  11/17/2021: L5-S1 interlaminar epidural steroid injection with 70% relief for ~2 weeks.  REC: repeat L5-S1 interlaminar epidural steroid injection   07/06/22: ongoing LEFT sided radiating low back pain.  Above ordered interlaminar epidural steroid injection was never performed or approved.  EXAM: LEFT DF/PF 4+/5 strength.  REC: L5-S1 interlaminar epidural steroid injection   08/08/22: L5-S1 interlaminar epidural steroid injection with 50% relief for three weeks.   09/12/22: leaned into a car 3 weeks prior.  Could not straight up and felt like he was in agony. REC: oral steroid taper.  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         Relevant Medications    predniSONE (Deltasone) 20 mg tablet       Other    Abnormal MRI, lumbar spine R93.7    Overview     10/03/21: MRI lumbar spine, Dorris Carnes    FINDINGS:  -  Five non-rib-bearing lumbar type vertebra.  -  Bones:  No evidence of acute fracture.  Mild chronic anterior wedging  at the T12 and L1 levels, unchanged.  The remaining aspects of the  vertebral body heights are preserved.  There is no suspicious marrow  replacing lesion or focal bone marrow edema identified.  -  Alignment: The usual lumbar lordosis is preserved.  There is mild  retrolisthesis of  L2 over L3 and L3 over L4.  -  Intervertebral discs/Joints: Multilevel lumbar spondylosis  characterized by loss of disc signal and intervertebral disc space  height in, marginal osteophyte formation, degenerative endplate  changes, disc bulging, facet hypertrophy and ligamentum flavum  thickening with significant findings by level described below.  -  Thecal sac: The conus medullaris terminates at the inferior L1 level.  The visualized distal spinal cord and cauda equina nerve roots are  unremarkable.  -  Paravertebral soft tissue: Unremarkable.  -  T12-L1:  There is no evidence of significant posterior disc  abnormality, spinal canal stenosis or neural foraminal narrowing.  -  L1-L2:  There is mild diffuse disc bulging without significant spinal  canal stenosis or neural foraminal narrowing.  -  L2-L3:  In addition to the retrolisthesis, there is diffuse disc  bulging, bilateral facet hypertrophy and effusions and ligamentum  flavum thickening resulting in mild spinal canal stenosis, mild  bilateral subarticular zone and neural foraminal narrowing.  -  L3-L4:  In addition to the retrolisthesis, there is left eccentric  disc bulging, bilateral facet hypertrophy and ligamentum flavum  thickening resulting in mild spinal canal stenosis, mild left  subarticular zone and moderate left and mild right neural foraminal  narrowing.  -  L4-L5:  There is mild left eccentric disc bulging, bilateral facet  hypertrophy and ligamentum flavum thickening resulting in mild  bilateral neural foraminal narrowing.  No significant central spinal  canal stenosis.  -  L5-S1:  There is a posterior disc bulge without significant spinal  canal stenosis or neural foraminal narrowing.             Total time spent (excluding billable services): 30 minutes for this follow visit.      Preparing to see the patient.       [place] review and reconciliation of medical/surgical history, medications, review of systems,        Time the provider spends taking a patient's history.      Performing a medically appropriate exam and/or evaluation.      Counseling and educating the patient/family/caregiver.      Ordering medications, tests, or procedures.      Referring and communicating with other healthcare professionals.      Time spent documenting clinical information.      Independent interpretation of results and communicating results to the patient/family/caregiver and care coordination       Timothy Lat, MD, [facility]  Physical Medicine & Rehabilitation, [facility]  Assistant Clinical Professor, [facility]

## 2022-09-21 ENCOUNTER — Ambulatory Visit: Admit: 2022-09-21 | Discharge: 2022-10-27 | Primary: Internal Medicine

## 2022-09-21 DIAGNOSIS — M47817 Spondylosis without myelopathy or radiculopathy, lumbosacral region: Secondary | ICD-10-CM

## 2022-09-21 NOTE — Progress Notes (Signed)
[address]  [place], [place] [address]     [address]  [place], [place] [address]    Phone: [phone]  Fax:      [phone]  E-mail: [email or web address]          HPI  Timothy Morris is a 52 y.o. male who presents for a follow upevaluation of hislower back pain without radiation.    [name] hispain as 7/10 VAS at its worst. The pain is described as sharpand can be exacerbated by standing andsleeping in a bad positionwhile alleviated bynone.    Most recently, [name] had a L5-S1 interlaminar epidural steroid injection on 08/08/22 with 50% relief for three week, however 2 weeks ago he was leaning inside of his car and felt an exacerbation of his pain. Since then he states he was in agony and the back pain has been functionally limiting. He was given a prednisone taper for this and reports improvement of symptoms however once discontinuing use he reports his pain returned.  Patient continue has discomfort over his left lumbar spine and now on the right side as well.  He denies radiation into his lower extremities.  He denies numbness or tingling.  He reports the pain is worse with seated and standing positions and alleviated with lying flat in bed.  Patient reports he has had difficulty working due to his discomfort.          Physical Exam:  General/Constitutional: No apparent distress. Well nourished and well developed.  Head / Face: Normocephalic atraumatic, Facial features symmetric.   Eyes: EOM grossly intact  Neurological: Alert and oriented X 3;  affect appropriate. patient cooperative, good historian.     Orthopedic Exam:   HEENT: NC/AT  SKIN: No acute lesions affecting the face  CEREBELLAR: Coordination is grossly intact      Diagnostic Tests  Lumbar Epidural Steroid Injection  Timothy Field, MD     08/15/2022 12:53 PM  Lumbar Epidural Steroid Injection    Performed by: Timothy Field, MD  Authorized by: Timothy Field, MD    Procedure details:   FLUOROSCOPICALLY GUIDED LUMBAR L5-S1  INTERLAMINAR EPIDURAL STEROID   INJECTION  The procedure was explained. Risks, benefits and alternatives were   explained. A consent form was signed. The patient was placed prone on the   exam table. The area overlying the lumbosacral spine was exposed and   prepared using a chlorhexidine swabs. This area was then allowed to dry   and draped. The entry point overlying the L5-S1 interlaminar area was   identified using both anatomic landmarks and fluoroscopy. A skin wheal was   raised at the entry point location using preservative free 1% lidocaine   without epinephrine delivered via a 1.5" 2g skin needle. The needle was   then inserted and directed toward the interlaminar area. As the needle was   withdrawn from each location, 1 mL of preservative free 1% lidocaine   without epinephrine was injected. A 20g 3.5" Tuohy was then inserted and   directed toward the interlaminar area using intermittent fluoroscopy in   AP, lateral, and 50 degree contralateral oblique views. The stylus was   removed and a loss of resistance syringe was connected to the needle.   Then, using the loss of resistance technique to saline the needle was   advanced to the epidural space. The location was confirmed through   injection of Omnipaque 240 mg/mL AP, lateral, and 50 degree contralateral   oblique views were saved  to the PACS, as appropriate. Then, 2 mL of normal   saline mixed with 1 mL of 40 mg/mL methylprednisolone was injected into   the epidural space. The needle was then withdrawn. The skin was cleansed   and a sterile adhesive bandage was placed overlying the entry site. All   aspirates were negative. There were no immediate complications. The   patient ambulated to the recovery area without difficulty.    Recovery and Discharge: After the procedure was completed, Ihor was   transferred to the recovery area and observed by our recovery staff. The   discharge instructions and post procedural pain logs were reviewed.  Red   flag  findings were discussed with the patient as a reason for emergent   evaluation.      Complications:  No immediate procedural complication noted.    Follow-up:  14-28 days post procedure.          Assessment & Plan  1. Lumbar pain    2. Degenerative disc disease, lumbar    3. Lumbosacral spondylosis without myelopathy       Reviewed PMH, PSH, med list and allergies.    Lumbar radiculopathy - Primary M54.16     Overview     09/20/21: the patient pain has been refractory to conservative management and more advanced treatment including LEFT and RIGHT L3-4 medial branch and L5 dorsal rami radiofrequency ablation performed at least a year ago.  He has continued a daily exercise program without any long term benefit.  10/03/2021: MR, LS spine, Shields: Posterior broad-based disc bulging at the L5-S1 level, asymmetric to the left, in a position to irritate the left S1 nerve root.  10/04/21: Left-sided dorsiflexor weakness.  Absent left Achilles reflex.  11/17/2021: L5-S1 interlaminar epidural steroid injection with 70% relief for ~2 weeks.  REC: repeat L5-S1 interlaminar epidural steroid injection   07/06/22: ongoing LEFT sided radiating low back pain.  Above ordered interlaminar epidural steroid injection was never performed or approved.  EXAM: LEFT DF/PF 4+/5 strength.  REC: L5-S1 interlaminar epidural steroid injection   08/08/22: L5-S1 interlaminar epidural steroid injection with 50% relief for three weeks.   09/12/22: leaned into a car 3 weeks prior.  Could not straight up and felt like he was in agony. REC: oral steroid taper.  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          Procedures     Spinal anatomy using the patient's imaging as an example was reviewed.  Management including physical therapy, spinal injections, and surgery was discussed. A stretching and exercise program was presented and encouraged    Spinal anatomy using the patient's imaging as an example was reviewed.  Management including physical therapy, spinal injections, and surgery was discussed. A stretching and exercise program was presented and encouraged        The patient had >80% relief from the previous diagnostic blocks.  The patient had improved functional mobility during the post procedural injection relief period and was able to decrease  oral medication intake.  The patient strictly adhered to the previously provided supervised home exercise program, since the last injection without long term benefit.  Hence, it is medically necessary to order: a radiofrequency ablation of the right and left L2-4 medial branch and L5 dorsal ramus.    Discussed red flag symptoms should necessitate an urgent evaluation including but not limited to new or worsening numbness, tingling, weakness, increasing pain, altered sensation in the groin regions, changes to bowel or bladder habits including retention and incontinence.          Patient understands and agrees with the plan, and questions were answered at this time.     Adaline Trejos PA-C        30 minutes were spent with the patient taking a history, performing an examination, reviewing studies, discussing the medical problem,  recommending treatment and documenting the encounter.        Lumbar pain [M54.50]      Past Medical History:   Diagnosis Date   . Fatty liver    . History of gout    . Primary hypertension        Past Surgical History:   Procedure Laterality Date   . ESOPHAGOGASTRODUODENOSCOPY  2023   . INGUINAL HERNIA REPAIR     . KNEE ARTHROSCOPY W/ DEBRIDEMENT Right     x2   . LEFT COLECTOMY       Sigmoid   . ROTATOR CUFF REPAIR Right        Social History     Occupational History   . Occupation: Short Order [employer]   Tobacco Use   . Smoking status: Never   . Smokeless tobacco: Never   Substance and Sexual Activity   . Alcohol use: Never   . Drug use: Yes     Types: Marijuana   . Sexual activity: Not on file       Current Outpatient Medications   Medication Instructions   . amLODIPine-benazepriL (Lotrel) 10-40 mg capsule 1 capsule, oral, Every morning   . atorvastatin (LIPITOR) 40 mg, oral, Daily, To lower cholesterol   . famotidine (PEPCID) 20 mg, oral, 2 times daily PRN   . metoprolol succinate XL (TOPROL-XL) 50 mg, oral, Daily, Do not crush or chew.       No Known Allergies

## 2022-09-21 NOTE — Progress Notes (Signed)
Problem List Items Addressed This Visit           ICD-10-CM       Nervous    Lumbar radiculopathy M54.16    Overview     09/20/21: the patient pain has been refractory to conservative management and more advanced treatment including LEFT and RIGHT L3-4 medial branch and L5 dorsal rami radiofrequency ablation performed at least a year ago.  He has continued a daily exercise program without any long term benefit.  10/03/2021: MR, LS spine, Shields: Posterior broad-based disc bulging at the L5-S1 level, asymmetric to the left, in a position to irritate the left S1 nerve root.  10/04/21: Left-sided dorsiflexor weakness.  Absent left Achilles reflex.  11/17/2021: L5-S1 interlaminar epidural steroid injection with 70% relief for ~2 weeks.  REC: repeat L5-S1 interlaminar epidural steroid injection   07/06/22: ongoing LEFT sided radiating low back pain.  Above ordered interlaminar epidural steroid injection was never performed or approved.  EXAM: LEFT DF/PF 4+/5 strength.  REC: L5-S1 interlaminar epidural steroid injection   08/08/22: L5-S1 interlaminar epidural steroid injection with 50% relief for three weeks.   09/12/22: leaned into a car 3 weeks prior.  Could not straight up and felt like he was in agony. REC: oral steroid taper.  If no relief, consider repeat L5-S1 interlaminar epidural steroid injection   09/21/22: maximum 30% relief.  Mostly low back pain.  Please see lumbar spondylosis            Musculoskeletal    Lumbosacral spondylosis without myelopathy - Primary M47.817    Overview     11/18/2020: LEFT and RIGHT L2-4 medial branch and L5 dorsal rami radiofrequency ablations with resolution of RIGHT sided pain, but persistent pain in the LEFT low back  12/10/2020: MRI ordered and denied.  09/21/22 Debera Lat, MD, Physical Medicine & Rehabilitation:  Return in RIGHT and LEFT sided low back pain.  EXAM: LEFT and RIGHT TTP LS paraspinal muscles and facet joints.  Pain with facet joint loading.  REC: repeat LEFT  and RIGHT L2-4 medial branch and L5 dorsal rami radiofrequency ablation          Current Assessment & Plan     The patient had >80% relief from the previous diagnostic blocks.  The patient had improved functional mobility during the post procedural injection relief period and was able to decrease oral medication intake.  The patient strictly adhered to the previously provided supervised home exercise program, since the last injection without long term benefit.  Hence, it is medically necessary to order: a radiofrequency ablation of the right and left L2-4 medial branch and L5 dorsal ramus.    Risks and benefits were explained.  All questions were answered.   The consent form will be signed immediately prior to the procedure.    The patient will be seen in follow up approximately 1 month following this procedure.          Relevant Orders    Lumbar Medial Branch Block RFA    Degenerative disc disease, lumbar M51.36    Overview     Working with ortho injections, nerve abaltion            Other    Abnormal MRI, lumbar spine R93.7    Overview     10/03/21: MRI lumbar spine, Dorris Carnes    FINDINGS:  -  Five non-rib-bearing lumbar type vertebra.  -  Bones:  No evidence of acute fracture.  Mild chronic anterior wedging  at the T12 and L1 levels, unchanged.  The remaining aspects of the  vertebral body heights are preserved.  There is no suspicious marrow  replacing lesion or focal bone marrow edema identified.  -  Alignment: The usual lumbar lordosis is preserved. There is mild  retrolisthesis of L2 over L3 and L3 over L4.  -  Intervertebral discs/Joints: Multilevel lumbar spondylosis  characterized by loss of disc signal and intervertebral disc space  height in, marginal osteophyte formation, degenerative endplate  changes, disc bulging, facet hypertrophy and ligamentum flavum  thickening with significant findings by level described below.  -  Thecal sac: The conus medullaris terminates at the inferior L1 level.  The  visualized distal spinal cord and cauda equina nerve roots are  unremarkable.  -  Paravertebral soft tissue: Unremarkable.  -  T12-L1:  There is no evidence of significant posterior disc  abnormality, spinal canal stenosis or neural foraminal narrowing.  -  L1-L2:  There is mild diffuse disc bulging without significant spinal  canal stenosis or neural foraminal narrowing.  -  L2-L3:  In addition to the retrolisthesis, there is diffuse disc  bulging, bilateral facet hypertrophy and effusions and ligamentum  flavum thickening resulting in mild spinal canal stenosis, mild  bilateral subarticular zone and neural foraminal narrowing.  -  L3-L4:  In addition to the retrolisthesis, there is left eccentric  disc bulging, bilateral facet hypertrophy and ligamentum flavum  thickening resulting in mild spinal canal stenosis, mild left  subarticular zone and moderate left and mild right neural foraminal  narrowing.  -  L4-L5:  There is mild left eccentric disc bulging, bilateral facet  hypertrophy and ligamentum flavum thickening resulting in mild  bilateral neural foraminal narrowing.  No significant central spinal  canal stenosis.  -  L5-S1:  There is a posterior disc bulge without significant spinal  canal stenosis or neural foraminal narrowing.        Other Visit Diagnoses       Codes    Lumbar pain     M54.50       Total time spent (excluding billable services): 30 minutes for this follow visit.      Preparing to see the patient.       [place] review and reconciliation of medical/surgical history, medications, review of systems,       Time the provider spends taking a patient's history.      Performing a medically appropriate exam and/or evaluation.      Counseling and educating the patient/family/caregiver.      Ordering medications, tests, or procedures.      Referring and communicating with other healthcare professionals.      Time spent documenting clinical information.      Independent interpretation of results and  communicating results to the patient/family/caregiver and care coordination.    Reviewed and approved by Jerilee Field O on 09/21/22 at 11:30 AM.

## 2022-09-21 NOTE — Assessment & Plan Note (Signed)
The patient had >80% relief from the previous diagnostic blocks.  The patient had improved functional mobility during the post procedural injection relief period and was able to decrease oral medication intake.  The patient strictly adhered to the previously provided supervised home exercise program, since the last injection without long term benefit.  Hence, it is medically necessary to order: a radiofrequency ablation of the right and left L2-4 medial branch and L5 dorsal ramus.    Risks and benefits were explained.  All questions were answered.   The consent form will be signed immediately prior to the procedure.    The patient will be seen in follow up approximately 1 month following this procedure.

## 2022-10-18 ENCOUNTER — Ambulatory Visit: Admit: 2022-10-18 | Discharge: 2022-10-18 | Attending: Physical Medicine & Rehabilitation | Primary: Internal Medicine

## 2022-10-18 DIAGNOSIS — M47817 Spondylosis without myelopathy or radiculopathy, lumbosacral region: Secondary | ICD-10-CM

## 2022-10-18 NOTE — Progress Notes (Signed)
[address]  [place], [place] [address]    [address]  [place], [place] [address]    Phone: [phone]  Fax:      [phone]  E-mail: [email or web address]        PATIENT NAME: Timothy Morris   PATIENT DOB:  12/27/1970   PCP:   [name], MD  PROCEDURALIST: Debera Lat, MD  PROCEDURE DATE: 10/18/2022    Vitals:    10/18/22 1115   BP: (!) 140/86   Weight: 104.3 kg   Height: 1.778 m        PROCEDURE NOTE  Lumbar Medial Branch Block RFA    Performed by: Jerilee Field, MD  Authorized by: Jerilee Field, MD    Consent:     Consent obtained:  Written and verbal    Consent given by:  Patient    Risks, benefits, and alternatives were discussed: yes      Risks discussed:  Allergic reaction, infection, nerve damage, swelling, bleeding, pain and unsuccessful block    Alternatives discussed:  No treatment, delayed treatment, alternative treatment and referral  Universal protocol:     Procedure explained and questions answered to patient or proxy's satisfaction: yes      Relevant documents present and verified: yes      Imaging studies available: yes      Site/side marked: yes      Immediately prior to procedure, a time out was called: yes      Patient identity confirmed:  Verbally with patient and provided demographic data  Indications:     Indications:  Pain relief  Levels/Interspaces:     Left interspaces:  L5-S1, L4-L5 and L3-L4    Right interspaces:  L5-S1, L4-L5 and L3-L4    Lateral oblique angle: right and left    Location:     Body area:  Trunk    Trunk nerve: lumbar    Laterality:  Bilateral  Pre-procedure details:     Skin preparation:  Chlorhexidine  Procedure details:   right and left LUMBAR L2-5 MEDIAL BRANCH/DORSAL RAMUS RADIOFREQUENCY ABLATION    Indication: POSIE JEFCOAT has pain rating at least 6/10 VAS and has failed conservative management including oral medications and physical therapy and/or a supervised home exercise program as mentioned in previous notes.  Woodie has imaging  and a physical examination which parallels the diagnosis. Diagnostic blocks were performed on previous dates in the same area which demonstrated significant, albeit transient, relief.    The procedure was explained.  Risks and benefits were explained.  All questions were answered.  A consent form was signed.    The area overlying the right and left lumbar paraspinals was identified using fluoroscopic guidance and anatomic landmarks.  This area was then prepped and draped in a sterile manner using ChloraPrep.  Then, 0.5 mL 50% preservative free 1% lidocaine, 50% 0.25% preservative free bupivacaine without epinephrine was used to raise a wheal, via a 1.5 inch 25-gauge needle.  This needle was further directed superiorly and inferiorly into the soft tissue to achieve anesthesia with 1 mL of the local anesthetic injected as the needle was retracted.  The needle was then retracted fully and a 10 cm insulated 18 g radiofrequency ablation needle canula with a 10 mm active tip was inserted and advanced toward the respective junction of the superior articular and transverse processes (for the L2, L3 and L4 medial branches) and the area medial to the sacral ala (for the L5  dorsal ramus) under AP, 20 degree ipsilateral oblique and lateral views using intermittent fluoroscopy.  Then, once a satisfactory location was identified, these areas were tested with both sensory and motor EMG testing.  No radiating findings were appreciated.  0.5  cc of Omnipaque 240 mg/mL was then instilled in each area without any vascular flow appreciated.  Then, 1 mL of 50% preservative free 2% lidocaine, 50% 0.25% preservative free bupivacaine without epinephrine was injected into each area.  The needle active tip was then heated to 80C for 90 seconds.  The needles were then withdrawn.  All aspirates were negative.  No postprocedural competitions were appreciated.     Block needle gauge:  18 G    Needle length (in):  3.5    Steroid injected:   None    Additive injected:  None    Injection procedure:  Anatomic landmarks identified, anatomic landmarks palpated and negative aspiration for blood    Paresthesia:  None  Medication:   5 mL lidocaine PF 10 mg/mL (1 %); 8 mL BUPivacaine HCl 0.5 % (5 mg/mL); 3 mL iohexol 240 mg iodine/mL; 6 mL lidocaine 20 mg/mL (2 %)  Post-procedure details:     Dressing:  Sterile dressing    Outcome:  Pain relieved    Procedure completion:  Tolerated well, no immediate complications          Debera Lat, MD, [facility] Medicine & Rehabilitation, [facility]  Assistant Clinical Professor, [facility]

## 2022-11-16 ENCOUNTER — Ambulatory Visit: Admit: 2022-11-16 | Discharge: 2022-11-21 | Primary: Legal Medicine

## 2022-11-16 DIAGNOSIS — M545 Low back pain, unspecified: Secondary | ICD-10-CM

## 2022-11-16 NOTE — Progress Notes (Signed)
[address]  [place], [place] [address]    [address]  [place], [place] [address]    Phone: [phone]  Fax:      [phone]  E-mail: [email or web address]     PATIENT NAME:  Timothy Morris   PATIENT DOB:  June 28, 1970   PCP:   [name], MD   PROVIDER:  Narda Bonds, PA     HPI  Timothy Morris is a 52 y.o. male who presents for a follow up evaluation of his lower back pain without radiation.  The pain is described as sharp and can be exacerbated by standing and sleeping in a bad position while alleviated by none.     Most recently, Timothy Morris had a radiofrequency ablation of the right and left L2-4 medial branch and L5 dorsal ramus on 10/18/22.   Patient continue has discomfort over his left lumbar spine and now on the right side as well.  He denies radiation into his lower extremities.  He denies numbness or tingling.  He reports the pain is worse with seated and standing positions and alleviated with lying flat in bed.  Patient reports he has had difficulty working due to his discomfort.  Following his radiofrequency ablation patient reports about 6% pain relief.  He reports his pain is 4 or 5 out of 10 at its worst.  He reports this is an ache specifically after long periods of activity or ambulation after long days at work.  He reports the pain is worse with extension and rotation.  He feels well at rest.        Physical Exam:  General/Constitutional: No apparent distress. Well nourished and well developed.  Head / Face: Normocephalic atraumatic, Facial features symmetric.   Eyes: EOM grossly intact  Neurological: Alert and oriented X 3;  affect appropriate. patient cooperative, good historian.      Orthopedic Exam:   HEENT: NC/AT  SKIN: No acute lesions affecting the face  CEREBELLAR: Coordination is grossly intact    Diagnostic Tests  Lumbar Epidural Steroid Injection  Jerilee Field, MD     08/15/2022 12:53 PM  Lumbar Epidural Steroid Injection    Performed by: Jerilee Field, MD  Authorized by:  Jerilee Field, MD    Procedure details:   FLUOROSCOPICALLY GUIDED LUMBAR L5-S1 INTERLAMINAR EPIDURAL STEROID   INJECTION  The procedure was explained. Risks, benefits and alternatives were   explained. A consent form was signed. The patient was placed prone on the   exam table. The area overlying the lumbosacral spine was exposed and   prepared using a chlorhexidine swabs. This area was then allowed to dry   and draped. The entry point overlying the L5-S1 interlaminar area was   identified using both anatomic landmarks and fluoroscopy. A skin wheal was   raised at the entry point location using preservative free 1% lidocaine   without epinephrine delivered via a 1.5" 2g skin needle. The needle was   then inserted and directed toward the interlaminar area. As the needle was   withdrawn from each location, 1 mL of preservative free 1% lidocaine   without epinephrine was injected. A 20g 3.5" Tuohy was then inserted and   directed toward the interlaminar area using intermittent fluoroscopy in   AP, lateral, and 50 degree contralateral oblique views. The stylus was   removed and a loss of resistance syringe was connected to the needle.   Then, using the loss of resistance technique to saline the needle was  advanced to the epidural space. The location was confirmed through   injection of Omnipaque 240 mg/mL AP, lateral, and 50 degree contralateral   oblique views were saved to the PACS, as appropriate. Then, 2 mL of normal   saline mixed with 1 mL of 40 mg/mL methylprednisolone was injected into   the epidural space. The needle was then withdrawn. The skin was cleansed   and a sterile adhesive bandage was placed overlying the entry site. All   aspirates were negative. There were no immediate complications. The   patient ambulated to the recovery area without difficulty.    Recovery and Discharge: After the procedure was completed, Rachael was   transferred to the recovery area and observed by our recovery staff. The    discharge instructions and post procedural pain logs were reviewed.  Red   flag findings were discussed with the patient as a reason for emergent   evaluation.      Complications:  No immediate procedural complication noted.    Follow-up:  14-28 days post procedure.          Assessment & Plan  1. Lumbar pain    2. Degenerative disc disease, lumbar    3. Lumbosacral spondylosis without myelopathy    4. Lumbar radiculopathy       Reviewed PMH, PSH, med list and allergies.      Procedures           Lumbar radiculopathy - Primary M54.16        Overview        09/20/21: the patient pain has been refractory to conservative management and more advanced treatment including LEFT and RIGHT L3-4 medial branch and L5 dorsal rami radiofrequency ablation performed at least a year ago.  He has continued a daily exercise program without any long term benefit.  10/03/2021: MR, LS spine, Shields: Posterior broad-based disc bulging at the L5-S1 level, asymmetric to the left, in a position to irritate the left S1 nerve root.  10/04/21: Left-sided dorsiflexor weakness.  Absent left Achilles reflex.  11/17/2021: L5-S1 interlaminar epidural steroid injection with 70% relief for ~2 weeks.  REC: repeat L5-S1 interlaminar epidural steroid injection   07/06/22: ongoing LEFT sided radiating low back pain.  Above ordered interlaminar epidural steroid injection was never performed or approved.  EXAM: LEFT DF/PF 4+/5 strength.  REC: L5-S1 interlaminar epidural steroid injection   08/08/22: L5-S1 interlaminar epidural steroid injection with 50% relief for three weeks.   09/12/22: leaned into a car 3 weeks prior.  Could not straight up and felt like he was in agony.   10/18/22 ra radiofrequency ablation of the right and left L2-4 medial branch and L5 dorsal ramus.         Spinal anatomy using the patient's imaging as an example was reviewed.  Management including physical therapy, spinal injections, and surgery was discussed.  A stretching and exercise  program was presented and encouraged.       We discussed patient's recent improvement of symptoms from his radiofrequency ablation L5-S1.  He would like to follow-up as needed.    Discussed red flag symptoms should necessitate an urgent evaluation including but not limited to new or worsening numbness, tingling, weakness, increasing pain, altered sensation in the groin regions, changes to bowel or bladder habits including retention and incontinence.       Patient understands and agrees with the plan, and questions were answered at this time.   Avantae Bither PA-C    20 minutes  were spent with the patient taking a history, performing an examination, reviewing studies, discussing the medical problem,  recommending treatment and documenting the encounter.        Lumbar pain [M54.50]      Past Medical History:   Diagnosis Date    Fatty liver     History of gout     Primary hypertension        Past Surgical History:   Procedure Laterality Date    ESOPHAGOGASTRODUODENOSCOPY  2023    INGUINAL HERNIA REPAIR      KNEE ARTHROSCOPY W/ DEBRIDEMENT Right     x2    LEFT COLECTOMY      Sigmoid    ROTATOR CUFF REPAIR Right        Social History     Occupational History    Occupation: Short Order Garment/textile technologist   Tobacco Use    Smoking status: Never    Smokeless tobacco: Never   Substance and Sexual Activity    Alcohol use: Never    Drug use: Yes     Types: Marijuana    Sexual activity: Not on file       Current Outpatient Medications   Medication Instructions    amLODIPine-benazepriL (Lotrel) 10-40 mg capsule 1 capsule, oral, Every morning    atorvastatin (LIPITOR) 40 mg, oral, Daily, To lower cholesterol    famotidine (PEPCID) 20 mg, oral, 2 times daily PRN    metoprolol succinate XL (TOPROL-XL) 50 mg, oral, Daily, Do not crush or chew.       No Known Allergies

## 2022-12-13 MED ORDER — BUPivacaine HCl (Marcaine) 0.5 % (5 mg/mL) injection 8 mL
0.5 | Freq: Once | INTRAMUSCULAR | Status: AC | PRN
Start: 2022-12-13 — End: 2022-10-18
  Administered 2022-10-18: 15:00:00 8 mL via INTRAMUSCULAR

## 2022-12-13 MED ORDER — lidocaine PF (Xylocaine) 10 mg/mL (1 %) injection 5 mL
10 | Freq: Once | INTRAMUSCULAR | Status: AC | PRN
Start: 2022-12-13 — End: 2022-10-18
  Administered 2022-10-18: 15:00:00 5 mL via INTRAMUSCULAR

## 2022-12-13 MED ORDER — lidocaine (Xylocaine) 20 mg/mL (2 %) injection 6 mL
20 | Freq: Once | INTRAMUSCULAR | Status: AC | PRN
Start: 2022-12-13 — End: 2022-10-18
  Administered 2022-10-18: 15:00:00 6 mL via INTRAMUSCULAR

## 2022-12-13 MED ORDER — iohexol (OMNIPaque) 240 mg iodine/mL solution 3 mL
240 | Freq: Once | INTRAVENOUS | Status: AC | PRN
Start: 2022-12-13 — End: 2022-10-18
  Administered 2022-10-18: 15:00:00 3 mL via INTRAVENOUS

## 2022-12-19 ENCOUNTER — Encounter: Attending: Internal Medicine | Primary: Legal Medicine

## 2022-12-21 ENCOUNTER — Ambulatory Visit: Admit: 2022-12-21 | Discharge: 2022-12-21 | Attending: Internal Medicine | Primary: Legal Medicine

## 2022-12-21 DIAGNOSIS — I10 Essential (primary) hypertension: Secondary | ICD-10-CM

## 2022-12-21 NOTE — Patient Instructions (Signed)
Limit Salt intake to 3 grams daily  Exercise at aerobic capacity 30 minutes daily   A healthy Body Mass Index is 26 or lower  Follow a Mediterranean Diet Plan or use learnhealthyfood.org

## 2022-12-21 NOTE — Progress Notes (Signed)
[facility] FAMILY [facility]IC[facility] [address] MA[facility]  [facility] FAM[facility] [address] MA[address]  [address]  [place] [address]  Dept: [phone]  Dept Fax: [phone]     Patient ID: Timothy Morris is a 52 y.o. male who presents for Follow-up.    Subjective   HPI  52 year old male here for biannual follow-up below listed active problems.  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  She suffers with lung disease and requires some help with activity of daily living.  Review of systems otherwise negative.    Patient Active Problem List   Diagnosis    Primary hypertension    Hyperlipidemia    Fatty liver    Degenerative disc disease, lumbar    Class 1 obesity due to excess calories with serious comorbidity and body mass index (BMI) of 31.0 to 31.9 in adult        Lumbosacral spondylosis without myelopathy        History of gout         Current Outpatient Medications   Medication Instructions    amLODIPine-benazepriL (Lotrel) 10-40 mg capsule 1 capsule, oral, Every morning    atorvastatin (LIPITOR) 40 mg, oral, Daily, To lower cholesterol    metoprolol succinate XL (TOPROL-XL) 50 mg, oral, Daily, Do not crush or chew.     Past Surgical History:   Procedure Laterality Date    ESOPHAGOGASTRODUODENOSCOPY  2023    INGUINAL HERNIA REPAIR      KNEE ARTHROSCOPY W/ DEBRIDEMENT Right     x2    LEFT COLECTOMY      Sigmoid    ROTATOR CUFF REPAIR Right      Family History   Problem Relation Name Age of Onset    Breast cancer Mother      No Known Problems Sister       Social History     Tobacco Use    Smoking status: Never    Smokeless tobacco: Never   Vaping Use    Vaping Use: Never used   Substance Use Topics    Alcohol use: Never    Drug use: Yes     Types: Marijuana     Review of Systems   Constitutional: Negative.    Cardiovascular: Negative.    Musculoskeletal:  Positive for back pain.        Occasional back pain but nothing as severe as it was in the past.  Tylenol Advil  occasionally as needed for pain range of motion exercises continuing       Objective   Visit Vitals  BP 128/79 (BP Location: Left arm, Patient Position: Sitting, BP Cuff Size: Large adult)   Pulse 55   Ht 1.778 m   Wt 107 kg   SpO2 96%   BMI 33.86 kg/m   BSA 2.3 m       Physical Exam  Constitutional:       Appearance: Normal appearance.   Cardiovascular:      Rate and Rhythm: Normal rate and regular rhythm.   Pulmonary:      Effort: Pulmonary effort is normal.      Breath sounds: Normal breath sounds.   Abdominal:      General: Abdomen is flat.      Palpations: Abdomen is soft.   Neurological:      General: No focal deficit present.         Assessment/Plan  Timothy Morris was seen today for follow-up.  Primary hypertension  Today's blood pressure is within goal limit per [facility] guidelines.  Compliant with medications.    Encouraged to maintain salt discipline and strive if possible to lose weight.  Encouraged to check home blood pressures using the American Heart Association handout which is provided and bring diary of readings to next visit.  Mixed hyperlipidemia   No signs or symptoms of ASPVD ACS continue with statin goal LDL below 100  Fatty liver  Counseled on weight avoid excess processed sugars daily exercise program encouraged      Patient Health Questionnaire-2 Score: 0   Interpretation: Negative screening.      Follow-up & Interventions: Maintain annual screening - No additional Follow-up required    Follow-up in 6 months earlier for abnormal labs or change in health status

## 2022-12-22 LAB — CBC
Hct: 44 % (ref 37.5–51.0)
Hgb: 14.8 g/dL (ref 13.0–17.7)
MCH: 32.4 pg (ref 26.6–33.0)
MCHC: 33.6 g/dL (ref 31.5–35.7)
MCV: 96 fL (ref 79–97)
Platelets: 157 10*3/uL (ref 150–450)
RBC: 4.57 x10E6/uL (ref 4.14–5.80)
RDW: 13.2 % (ref 11.6–15.4)
WBC: 5.9 10*3/uL (ref 3.4–10.8)

## 2022-12-22 LAB — BASIC METABOLIC PANEL
Anion Gap: 13 mmol/L (ref 10.0–18.0)
BUN/Creat Ratio: 14 (ref 9–20)
BUN: 15 mg/dL (ref 6–24)
Calcium: 9.8 mg/dL (ref 8.7–10.2)
Carbon Dioxide: 22 mmol/L (ref 20–29)
Chloride: 103 mmol/L (ref 96–106)
Creat: 1.06 mg/dL (ref 0.76–1.27)
Glucose: 87 mg/dL (ref 70–99)
Potassium: 4.4 mmol/L (ref 3.5–5.2)
Sodium: 138 mmol/L (ref 134–144)
eGFR: 83 mL/min/{1.73_m2} (ref >59)

## 2022-12-22 LAB — LIPID PANEL
Cholesterol: 100 mg/dL (ref 100–199)
HDL Cholesterol: 32 mg/dL — ABNORMAL LOW (ref >39)
LDLc Calc (NIH): 38 mg/dL (ref 0–99)
Non-HDL Chol: 68 mg/dL (ref 0–129)
Triglycerides: 182 mg/dL — ABNORMAL HIGH (ref 0–149)
VLDLc Calc: 30 mg/dL (ref 5–40)

## 2022-12-22 LAB — HEMOGLOBIN A1C: HgbA1C: 5.5 % (ref 4.8–5.6)

## 2022-12-22 LAB — TSH WITH REFLEX: TSH: 1.16 u[IU]/mL (ref 0.450–4.500)

## 2023-01-18 NOTE — Telephone Encounter (Signed)
Appointment:        Date of patient's next encounter in the current department:  06/26/2023   Date of patient's next encounter with the current provider:  Visit date not found   Date of patient's last encounter in the current department: 12/21/2022      Last BP:   BP Readings from Last 3 Encounters:   12/21/22 128/79   10/18/22 (!) 140/86   08/08/22 (!) 150/90       Last HGBA1C:   HgbA1C (%)   Date Value   12/21/2022 5.5       Labs  TSH   Date Value Ref Range Status   12/21/2022 1.160 0.450 - 4.500 uIU/mL Final     Comment:     No apparent thyroid disorder. Additional testing not indicated. In  rare instances, Secondary Hypothyroidism as well as Subclinical  Hypothyroidism have been reported in some patients with normal TSH  values.       Lab Results   Component Value Date    CALCIUM 9.8 12/21/2022    ALBUMIN 4.2 11/26/2021    NA 138 12/21/2022    K 4.4 12/21/2022    CO2 22 12/21/2022    CL 103 12/21/2022    BUN 15 12/21/2022    CREATININE 1.06 12/21/2022      AST   Date Value Ref Range Status   11/01/2021 52 (H) 6 - 42 U/L Final   11/16/2020 35 6 - 42 U/L Final     AST (SGOT)   Date Value Ref Range Status   11/26/2021 53 (H) 15 - 37 IU/L Final     ALT   Date Value Ref Range Status   11/01/2021 104 (H) 0 - 55 U/L Final   11/16/2020 64 (H) 0 - 55 U/L Final     ALT (SGPT)   Date Value Ref Range Status   11/26/2021 99 (H) 0 - 55 IU/L Final     Comment:     Lab Director: Latanya Presser     No components found for: "TBILI"  Hemoglobin   Date Value Ref Range Status   11/01/2021 15.1 13.0 - 17.5 g/dL Final     Hgb   Date Value Ref Range Status   12/21/2022 14.8 13.0 - 17.7 g/dL Final     Hematocrit   Date Value Ref Range Status   11/01/2021 42.6 37.0 - 53.0 % Final     Hct   Date Value Ref Range Status   12/21/2022 44.0 37.5 - 51.0 % Final     Platelets   Date Value Ref Range Status   12/21/2022 157 150 - 450 x10E3/uL Final   11/01/2021 241 150 - 400 K/uL Final     No results found for: "PTT"  No results found for:  "INR"  No results found for: "PROTIME"  No results found for: "PTADJUSTED"    Last Urine Drug Screen: on .  Narcotic Medications:    Directives/Controlled Substance Agreement   PMP Appropriate __YES      ___No

## 2023-03-29 ENCOUNTER — Ambulatory Visit
Admit: 2023-03-29 | Discharge: 2023-03-29 | Payer: PRIVATE HEALTH INSURANCE | Attending: Legal Medicine | Primary: Legal Medicine

## 2023-03-29 DIAGNOSIS — R140 Abdominal distension (gaseous): Secondary | ICD-10-CM

## 2023-03-29 NOTE — Progress Notes (Signed)
[facility] FAMILY MEDICINE [facility] MAIN  [facility] [address] MA[address]  [address]  [address]  [place] [address]  Dept: [phone]  Dept Fax: [phone]     Patient ID: Timothy Morris is a 52 y.o. male who presents for Follow-up (Stomache issues and L finger numbness).    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    Patient Active Problem List   Diagnosis    Primary hypertension (CMS-HCC)    Hyperlipidemia (CMS-HCC)    Fatty liver    Degenerative disc disease, lumbar    Class 1 obesity due to excess calories with serious comorbidity and body mass index (BMI) of 31.0 to 31.9 in adult    Lumbar radiculopathy    Lumbosacral spondylosis without myelopathy    Cervical radiculopathy    History of gout    Abnormal MRI, lumbar spine     Current Outpatient Medications   Medication Instructions    amLODIPine-benazepriL (Lotrel) 10-40 mg capsule 1 capsule, oral, Every morning    atorvastatin (LIPITOR) 40 mg, oral, Daily, To lower cholesterol    metoprolol succinate XL (TOPROL-XL) 50 mg, oral, Daily, Do not crush or chew.    omeprazole (PRILOSEC) 20 mg, oral, Daily before breakfast, Do not crush or chew.     No Known Allergies  Past Medical History:   Diagnosis Date    Fatty liver     History of gout     Primary  hypertension (CMS-HCC)      Past Surgical History:   Procedure Laterality Date    ESOPHAGOGASTRODUODENOSCOPY  2023    INGUINAL HERNIA REPAIR      KNEE ARTHROSCOPY W/ DEBRIDEMENT Right     x2    LEFT COLECTOMY      Sigmoid    ROTATOR CUFF REPAIR Right      Family History   Problem Relation Name Age of Onset    Breast cancer Mother      No Known Problems Sister       Social History     Tobacco Use    Smoking status: Never    Smokeless tobacco: Never   Vaping Use    Vaping Use: Never used   Substance Use Topics    Alcohol use: Never    Drug use: Yes     Types: Marijuana     Immunization History   Administered Date(s) Administered    COVID-19 Pfizer Monovalent 09/19/2019, 10/29/2019    Covid-19,mrna,lnp-s,pf,36mcg/0.3ml 07/01/2020    Tdap 06/20/2017        ROS     There are no discontinued medications.  Outpatient Medications Prior to Visit   Medication Sig Dispense Refill    amLODIPine-benazepriL (Lotrel) 10-40 mg capsule TAKE 1 CAPSULE BY MOUTH EVERY  DAY IN THE MORNING 90 capsule 1    atorvastatin (Lipitor) 40 mg tablet Take 1 tablet (40 mg) by mouth once daily. To lower cholesterol 90 tablet 3    metoprolol succinate XL (Toprol-XL) 50 mg 24 hr tablet Take 1 tablet (50 mg) by mouth once daily. Do not crush or chew. 90 tablet 3    omeprazole (PriLOSEC) 20 mg DR capsule Take 20 mg by mouth before breakfast. Do not crush or chew.         Objective   Visit Vitals  BP (!) 160/94 (BP Location: Left arm, Patient Position: Sitting, BP Cuff Size: Large adult)   Pulse 61   Temp 36.8 C (98.3 F) (Temporal)   Ht 1.778 m   Wt 113.9 kg   SpO2 96%   BMI 36.01 kg/m   BSA 2.37 m       Physical Exam  Awake and alert, VSS, NAD. HEENT: NCAT, PERRLA, EOMI, sclera non-icteric, conjunctiva pink without swelling or drainage,  visual accuity grossly normal. Throat with no erythema or exudate, soft palate midline and raises symmetrically, dental in good repair.Neck: Supple, no tenderness or spasm,  no adenopathy, no thyromegaly, no masses,  no carotid bruits, trachea is midline and freely movable.Lungs: Chest moving equally on both sides.  No retraction or stridor. No increase in AP diameter of the chest.  No wheezing, rhonchi, rales, or crackles.  Breath sounds are well heard.  Heart: RRR without murmur, normal S1S2, negative rubs, clicks, gallops, negative S3 or S4, PMI is in the L 5th ICS & MCL.Abdomen: Soft, non-tender, no guarding or rebound. B.S. x 4, - H/S megaly, - renal/abdominal bruits,  - palpable masses, no ventral/umbilical hernias. No CVA tenderness.  The left wrist had full range of motion with no tenderness, Tinel's and Phalen's signs both positive.    Assessment/Plan   [name] was seen today for follow-up.  Bloating  -     US ABDOMEN LIMITED; Future  Carpal tunnel syndrome of left wrist    1.  Abdominal bloating, over a year status post cholecystectomy, consider common duct stones versus other abdominal etiologies  Discussed the above, continue the Prilosec but also ordered an ultrasound of the abdomen for further evaluation, will address further or refer pending results.  2.  Left carpal tunnel syndrome  Patient instructed on getting a carpal tunnel splint to wear overnight and as much as possible, if no better or worsening consider a referral.    Patient Health Questionnaire-2 Score: 0   Interpretation: Negative screening.     Follow-up & Interventions: Maintain annual screening - No additional Follow-up required          Lorain Childes, MD

## 2023-04-05 ENCOUNTER — Ambulatory Visit: Payer: PRIVATE HEALTH INSURANCE | Primary: Legal Medicine

## 2023-04-18 NOTE — Telephone Encounter (Signed)
Department Name: Primary Care     Patient: Timothy Morris  MRN: [id number]  Agent: Virgina Norfolk    Clinical Access Center Scheduling Message    Patient calling to request a referral to a specialist.    Reason for referral (symptoms):   Contacts prescription      Specialty: Ophthalmology   Domenic A. [address] OD  [address] [place], [place] [address]  NPI [id number]    Patient states he contacted [facility] and was directed back to primary care because the doctor was not a [facility] provider.     Has patient been seen by the office/discussed issue with primary care provider before (Yes )?    Patient confirmed current insurance is correct.     Best call back for Ayeden [phone]

## 2023-04-18 NOTE — Progress Notes (Signed)
Please sign order. TY

## 2023-04-19 NOTE — Telephone Encounter (Signed)
 The [facility] Team has prepared this In-PHO Referral Order for you.  Your patient provided the information used to complete the details.  When you sign off it implies that you are in agreement that this referral is the appropriate clinical plan for this patient.    In order to attach a Referral Order to you we needed to open a Phone Note Encounter which also needs to be signed.  Please:  Review the Order for clinical appropriateness  Sign the Order  Sign off the Phone Note Encounter    [employer] Referral Team

## 2023-04-19 NOTE — Telephone Encounter (Signed)
Referral Order placed

## 2023-04-20 NOTE — Telephone Encounter (Signed)
Appointment:        Date of patient's next encounter in the current department:  06/26/2023   Date of patient's next encounter with the current provider:  04/19/2023   Date of patient's last encounter in the current department: 03/29/2023      Last BP:   BP Readings from Last 3 Encounters:   03/29/23 (!) 160/94   12/21/22 128/79   10/18/22 (!) 140/86       Last HGBA1C:   HgbA1C (%)   Date Value   12/21/2022 5.5       Labs  TSH   Date Value Ref Range Status   12/21/2022 1.160 0.450 - 4.500 uIU/mL Final     Comment:     No apparent thyroid disorder. Additional testing not indicated. In  rare instances, Secondary Hypothyroidism as well as Subclinical  Hypothyroidism have been reported in some patients with normal TSH  values.       Lab Results   Component Value Date    CALCIUM 9.8 12/21/2022    ALBUMIN 4.2 11/26/2021    NA 138 12/21/2022    K 4.4 12/21/2022    CO2 22 12/21/2022    CL 103 12/21/2022    BUN 15 12/21/2022    CREATININE 1.06 12/21/2022      AST   Date Value Ref Range Status   11/01/2021 52 (H) 6 - 42 U/L Final   11/16/2020 35 6 - 42 U/L Final     AST (SGOT)   Date Value Ref Range Status   11/26/2021 53 (H) 15 - 37 IU/L Final     ALT   Date Value Ref Range Status   11/01/2021 104 (H) 0 - 55 U/L Final   11/16/2020 64 (H) 0 - 55 U/L Final     ALT (SGPT)   Date Value Ref Range Status   11/26/2021 99 (H) 0 - 55 IU/L Final     Comment:     Lab Director: Latanya Presser     No components found for: "TBILI"  Hemoglobin   Date Value Ref Range Status   11/01/2021 15.1 13.0 - 17.5 g/dL Final     Hgb   Date Value Ref Range Status   12/21/2022 14.8 13.0 - 17.7 g/dL Final     Hematocrit   Date Value Ref Range Status   11/01/2021 42.6 37.0 - 53.0 % Final     Hct   Date Value Ref Range Status   12/21/2022 44.0 37.5 - 51.0 % Final     Platelets   Date Value Ref Range Status   12/21/2022 157 150 - 450 x10E3/uL Final   11/01/2021 241 150 - 400 K/uL Final     No results found for: "PTT"  No results found for: "INR"  No  results found for: "PROTIME"  No results found for: "PTADJUSTED"    Last Urine Drug Screen: on .  Narcotic Medications:    Directives/Controlled Substance Agreement   PMP Appropriate __YES      ___No

## 2023-04-27 ENCOUNTER — Inpatient Hospital Stay
Admit: 2023-04-27 | Discharge: 2023-04-27 | Disposition: A | Discharge status: Home / Self Care | Payer: PRIVATE HEALTH INSURANCE

## 2023-04-27 DIAGNOSIS — T7840XA Allergy, unspecified, initial encounter: Secondary | ICD-10-CM

## 2023-04-27 MED ORDER — EPINEPHrine (Adrenalin) 0.3 mg injection 0.3 mg
0.3 | Freq: Once | INTRAMUSCULAR | Status: DC
Start: 2023-04-27 — End: 2023-04-27

## 2023-04-27 MED ORDER — EPINEPHrine (Epipen) 0.3 mg/0.3 mL auto-injector
0.3 | Freq: Once | INTRAMUSCULAR | 0 refills | 30.00000 days | Status: AC | PRN
Start: 2023-04-27 — End: ?

## 2023-04-27 MED ORDER — diphenhydrAMINE (BenadryL) 25 mg capsule
25 | ORAL_CAPSULE | Freq: Three times a day (TID) | ORAL | 0 refills | 8.50000 days | Status: DC
Start: 2023-04-27 — End: 2023-06-06

## 2023-04-27 MED ORDER — predniSONE (Deltasone) 50 mg tablet
50 | ORAL_TABLET | Freq: Every day | ORAL | 0 refills | Status: AC
Start: 2023-04-27 — End: 2023-05-02

## 2023-04-27 MED ORDER — sodium chloride 0.9 % bolus 1,000 mL
0.9 | Freq: Once | INTRAVENOUS | Status: AC
Start: 2023-04-27 — End: 2023-04-27
  Administered 2023-04-27: 19:00:00 1000 mL via INTRAVENOUS

## 2023-04-27 MED ORDER — amLODIPine (Norvasc) 10 mg tablet
10 | ORAL_TABLET | Freq: Every day | ORAL | 0 refills | Status: DC
Start: 2023-04-27 — End: 2023-05-03

## 2023-04-27 MED FILL — EPINEPHRINE 0.3 MG KIT: 0.3 0.3 mg | INTRAMUSCULAR | Qty: 0.3

## 2023-04-27 NOTE — ED Triage Notes (Signed)
Pt BIBA from UC s/p allergic reaction possibly to chicken cutlet. NKA. Pt had swelling of lips, tongue and hives on chest upon presentation to UC. Pt received solumedrol, epi, benadryl and pepcid at UC along with NS. Airway patent, pt denies complaints at this time.

## 2023-04-27 NOTE — Discharge Instructions (Addendum)
You were seen today for evaluation of allergic reaction.  Prior to arrival, you had already been given steroids, Benadryl, Pepcid, and EpiPen.  We gave you IV fluids today and monitored you for 4 hours.  You did not require any additional medications.      Sent you multiple prescriptions.  Continue Benadryl every 6-8 hours for the next 24 hours, as needed thereafter.  Tomorrow, continue Pepcid and prednisone once daily for an additional 4 days.  If you develop any symptoms of anaphylaxis, immediately inject EpiPen into thigh and call 911.  Rest and stay well-hydrated.      Discontinue your amlodipine/Benazapril prescription.  I sent you an amlodipine 10 mg prescription to resume tomorrow.  Follow up with primary care provider as you will likely require a new antihypertensive medication to take in addition to the amlodipine. Benazepril is an ACE inhibitor which can cause this type of reaction so it is important to discontinue this.    Return here with any concerns.

## 2023-04-27 NOTE — ED Notes (Signed)
Pt resting comfortably. No new orders at this time.      Susy Frizzle, RN  04/27/23 1600

## 2023-04-27 NOTE — ED Provider Notes (Signed)
[facility]  [address]  [place] [address]    Chief Complaint   Patient presents with    Allergic Reaction       HISTORY OF PRESENT ILLNESS  52 year old male with history of hypertension on amlodipine/benazepril combination, hyperlipidemia presents to the emergency department at the direction of outside urgent care due to allergic reaction.  States he ate lunch today at 1230 consisting of a slice of pizza and a Caesar salad with chicken.  Has eaten this meal before, no new ingredients.  About 15 minutes later, developed itching to his hands as well as tongue swelling.  He went to an outside urgent care and given symptoms, was given an EpiPen, IV Solu-Medrol/Benadryl, and p.o. Pepcid at approximately 1330 then sent here via ambulance.  On arrival, states symptoms significantly improved after about 15 minutes following him being medicated.  Currently, only complaint is dry mouth.  No longer feeling any swelling within or around the mouth.  No throat closing sensation.  No itching or tingling around his mouth.  No shortness of breath or wheezing.  No abdominal pain or vomiting.  No lightheadedness or syncope.  Denies any history of anaphylaxis or allergic reaction.  No new medications, has been on the same antihypertensive medication for several years.  No recent travel.  No new skin products or detergents.  Patient otherwise feeling well.  No headache, dizziness, chest pain, shortness of breath, abdominal pain, nausea, or vomiting.      History provided by:  Patient  Language interpreter used: No      PAST MEDICAL HISTORY  Patient Active Problem List   Diagnosis    Primary hypertension (CMS-HCC)    Hyperlipidemia (CMS-HCC)    Fatty liver    Degenerative disc disease, lumbar    Class 1 obesity due to excess calories with serious comorbidity and body mass index (BMI) of 31.0 to 31.9 in adult    Lumbar radiculopathy    Lumbosacral spondylosis without myelopathy    Cervical  radiculopathy    History of gout    Abnormal MRI, lumbar spine     Past Medical History:   Diagnosis Date    Fatty liver     History of gout     Primary hypertension (CMS-HCC)      SURGICAL/FAM/SOCIAL HISTORY  Past Surgical History:   Procedure Laterality Date    ESOPHAGOGASTRODUODENOSCOPY  2023    INGUINAL HERNIA REPAIR      KNEE ARTHROSCOPY W/ DEBRIDEMENT Right     x2    LEFT COLECTOMY      Sigmoid    ROTATOR CUFF REPAIR Right        Family History   Problem Relation Name Age of Onset    Breast cancer Mother      No Known Problems Sister         Social History     Tobacco Use    Smoking status: Never    Smokeless tobacco: Never   Vaping Use    Vaping status: Never Used   Substance Use Topics    Alcohol use: Never    Drug use: Yes     Types: Marijuana       MEDICATIONS  Prior to Admission medications    Medication Sig Start Date End Date Taking? Authorizing Provider   amLODIPine-benazepriL (Lotrel) 10-40 mg capsule TAKE 1 CAPSULE BY MOUTH EVERY DAY IN THE MORNING 01/18/23   Thersa Salt, NP   atorvastatin (Lipitor) 40 mg tablet  TAKE 1 TABLET (40 MG) BY MOUTH ONCE DAILY. TO LOWER CHOLESTEROL 04/20/23   Lorain Childes, MD   metoprolol succinate XL (Toprol-XL) 50 mg 24 hr tablet TAKE 1 TABLET (50 MG) BY MOUTH EVERY DAY DO NOT CRUSH OR CHEW 04/20/23   Lorain Childes, MD   omeprazole (PriLOSEC) 20 mg DR capsule Take 20 mg by mouth before breakfast. Do not crush or chew.    Historical Provider, MD      Allergies   Allergen Reactions    Ace Inhibitors Angioedema     REVIEW OF SYSTEMS  Review of Systems   Constitutional:  Negative for chills and fever.   HENT:  Negative for congestion.    Eyes:  Negative for visual disturbance.   Respiratory:  Negative for cough and shortness of breath.    Cardiovascular:  Negative for chest pain and palpitations.   Gastrointestinal:  Negative for abdominal pain, nausea and vomiting.   Genitourinary:  Negative for dysuria and hematuria.   Musculoskeletal:  Negative for arthralgias.    Skin:  Negative for rash.   Neurological:  Negative for light-headedness and headaches.     PHYSICAL EXAM  ED Triage Vitals [04/27/23 1358]   Temp Pulse Resp BP   -- 71 18 130/88      SpO2 Temp src Heart Rate Source Patient Position   96 % -- -- --      BP Location FiO2 (%)     -- --       Physical Exam  Vitals and nursing note reviewed.   Constitutional:       General: He is not in acute distress.     Appearance: Normal appearance. He is not ill-appearing or toxic-appearing.      Comments: Well-appearing male resting upright in stretcher.  In no acute distress.   HENT:      Head: Normocephalic and atraumatic.      Mouth/Throat:      Mouth: Mucous membranes are moist.      Pharynx: Oropharynx is clear. No pharyngeal swelling.      Comments: Oropharynx grossly patent without swelling.  Uvula midline.  No elevation to floor of mouth.  Tolerating secretions.  No tongue swelling.  No swelling around the mouth or angioedema.  Eyes:      Conjunctiva/sclera: Conjunctivae normal.   Cardiovascular:      Rate and Rhythm: Normal rate and regular rhythm.   Pulmonary:      Effort: Pulmonary effort is normal. No respiratory distress.      Breath sounds: Normal breath sounds. No stridor. No wheezing, rhonchi or rales.      Comments: Speaking in full clear sentences without difficulty or audible wheeze.  No accessory muscle usage or tripoding.  No cyanosis.  No stridor or wheezing.  Abdominal:      Palpations: Abdomen is soft.      Tenderness: There is no abdominal tenderness.   Musculoskeletal:      Cervical back: Normal range of motion.   Skin:     General: Skin is warm.      Comments: No visible rash or urticaria.   Neurological:      General: No focal deficit present.      Mental Status: He is alert.       TESTING RESULTS  PROCEDURES  Labs Reviewed - No data to display  No orders to display       Procedures  ED COURSE/MDM  Diagnoses as of 04/27/23 1632  Allergic reaction, initial encounter   History of use of angiotensin  converting enzyme inhibitor     ED Course & MDM   Medical Decision Making  52 year old male with history of ACE inhibitor use, otherwise no known allergies or anaphylaxis history presented to the ED at the direction of outside urgent care for further observation/management after developing symptoms of allergic reaction including swelling of tongue.  Had been given IV steroids, Benadryl, IM EpiPen, and p.o. Pepcid prior to arrival with essentially full resolution of the symptoms.    On evaluation, patient is alert and oriented, well-appearing, in no acute distress.  Vital signs generally unremarkable.  He is afebrile and neurovascularly intact.  Physical exam generally unremarkable.    Patient already with IV on arrival.  I did order EpiPen to place at the bedside in the event that he developed rebound anaphylaxis, however has not been given as no indication.  Provided 1 L IV fluid bolus.  Serial exams remained unchanged and patient had no further symptoms of anaphylaxis/angioedema, patient continues to feel well and at his baseline.     At completion of shift, case signed out to PA, Grim.  I have sent patient additional supply of prednisone, Benadryl, and Pepcid as well as 2 EpiPen's.  Advise he discontinue his amlodipine/benazepril as this may be ACE induced angioedema.  I have added ACEI to his allergy list.  I sent him 10 days of regular amlodipine to continue tomorrow and adivsed follow-up closely with PCP as a likely require a new additional antihypertensive medication.  Counseled on return precautions.  Patient verbalized understanding agrees with this plan.  All questions were answered.    Anticipate patient will be appropriate for discharge after the 4-hour period of observation since Epi-pen which will be at 1730.  Case was reviewed with ER attending, Dr. Nicholas Lose who agrees with plan.    Risk  Prescription drug management.      DIAGNOSIS  Problem List Items Addressed This Visit    None  Visit Diagnoses        Allergic reaction, initial encounter    -  Primary    Relevant Medications    amLODIPine (Norvasc) 10 mg tablet (Start on 04/28/2023)    predniSONE (Deltasone) 50 mg tablet (Start on 04/28/2023)    diphenhydrAMINE (BenadryL) 25 mg capsule    EPINEPHrine (Epipen) 0.3 mg/0.3 mL auto-injector    History of use of angiotensin converting enzyme inhibitor              CONDITION  Fair    DISPOSITION  Likely discharge at 1730 after 4 hour observation s/p Epi-pen.  Signed out to PA Grim.           Gertie Fey, [place]  04/27/23 [phone]

## 2023-04-28 NOTE — ED Notes (Signed)
Call made.  Pt states is doing good today.  Taking medications as prescribed.  Eating and drinking.  Will follow up per discharge instructions.     Dalene Carrow, RN  04/28/23 1053

## 2023-04-28 NOTE — Telephone Encounter (Signed)
 Patient is calling regarding above matter, please follow

## 2023-04-28 NOTE — Telephone Encounter (Signed)
Department Name: Primary care     Patient: Timothy Morris  MRN: [id number]  Agent: Everrett Coombe    Clinical Access Center Scheduling Message    Patient needs a hospital follow-up appointment:    Who is calling: Vernia Buff     [facility] of [facility]     Date of Discharge:  04/27/2023    Call back number is:  [phone]

## 2023-05-01 NOTE — Telephone Encounter (Signed)
Patient notified

## 2023-05-02 ENCOUNTER — Encounter: Payer: PRIVATE HEALTH INSURANCE | Attending: Legal Medicine | Primary: Legal Medicine

## 2023-05-03 ENCOUNTER — Ambulatory Visit
Admit: 2023-05-03 | Discharge: 2023-05-03 | Payer: PRIVATE HEALTH INSURANCE | Attending: Legal Medicine | Primary: Legal Medicine

## 2023-05-03 DIAGNOSIS — T7840XA Allergy, unspecified, initial encounter: Secondary | ICD-10-CM

## 2023-05-03 MED ORDER — amLODIPine (Norvasc) 10 mg tablet
10 | ORAL_TABLET | Freq: Every day | ORAL | 1 refills | Status: DC
Start: 2023-05-03 — End: 2023-05-29

## 2023-05-03 NOTE — Progress Notes (Signed)
[facility]  [facility]  [address]  [address]  [place] [address]  Dept: [phone]  Dept Fax: [phone]     Patient ID: Timothy Morris is a 52 y.o. male who presents for Follow-up.    Subjective   HPI  Patient is here today for ER follow-up, he was seen there last week with an allergic reaction, possible angioedema, he started having some itching and then tongue swelling, he went to urgent care and then was sent to the ER, treated there with intravenous fluids and sent home with Benadryl and steroids, this was presumed to be due to the Lotrel with the benazepril part of it which he had been on for years, he was told to stop that and was put on amlodipine 10 but he also stopped his other medications including metoprolol, atorvastatin and Prilosec, he feels better other than some buzzing in his left ear, no headache, chest pain or shortness of breath.    Patient Active Problem List   Diagnosis    Primary hypertension (CMS-HCC)    Hyperlipidemia (CMS-HCC)    Fatty liver    Degenerative disc disease, lumbar    Class 1 obesity due to excess calories with serious comorbidity and body mass index (BMI) of 31.0 to 31.9 in adult    Lumbar radiculopathy    Lumbosacral spondylosis without myelopathy    Cervical radiculopathy    History of gout    Abnormal MRI, lumbar spine     Current Outpatient Medications   Medication Instructions    amLODIPine (NORVASC) 10 mg, oral, Daily    atorvastatin (LIPITOR) 40 mg, oral, Daily, To lower cholesterol    diphenhydrAMINE (BENADRYL) 25 mg, oral, Every 8 hours    EPINEPHrine (EPIPEN) 0.3 mg, intramuscular, Once as needed, Call 911 after use.    metoprolol succinate XL (TOPROL-XL) 50 mg, oral, Daily, Do not crush or chew    omeprazole (PRILOSEC) 20 mg, Daily before breakfast     Allergies   Allergen Reactions    Ace Inhibitors Angioedema     Past Medical History:   Diagnosis Date    Fatty liver     History of gout     Primary hypertension  (CMS-HCC)      Past Surgical History:   Procedure Laterality Date    ESOPHAGOGASTRODUODENOSCOPY  2023    INGUINAL HERNIA REPAIR      KNEE ARTHROSCOPY W/ DEBRIDEMENT Right     x2    LEFT COLECTOMY      Sigmoid    ROTATOR CUFF REPAIR Right      Family History   Problem Relation Name Age of Onset    Breast cancer Mother      No Known Problems Sister       @[facility] @  Immunization History   Administered Date(s) Administered    [employer] Monovalent 09/19/2019, 10/29/2019    Covid-19,mrna,lnp-s,pf,69mcg/0.3ml 07/01/2020    Tdap 06/20/2017        ROS     Medications Discontinued During This Encounter   Medication Reason    amLODIPine (Norvasc) 10 mg tablet Reorder     Outpatient Medications Prior to Visit   Medication Sig Dispense Refill    amLODIPine (Norvasc) 10 mg tablet Take 1 tablet (10 mg) by mouth once daily for 10 days. 10 tablet 0    atorvastatin (Lipitor) 40 mg tablet TAKE 1 TABLET (40 MG) BY MOUTH ONCE DAILY. TO LOWER CHOLESTEROL (Patient not  taking: Reported on 05/03/2023) 90 tablet 3    diphenhydrAMINE (BenadryL) 25 mg capsule Take 1 capsule (25 mg) by mouth every 8 (eight) hours for 12 doses. 12 capsule 0    EPINEPHrine (Epipen) 0.3 mg/0.3 mL auto-injector Inject entire contents of epinephrine auto-injector into thigh. Call 911 after use. (Patient not taking: Inject entire contents of epinephrine auto-injector into thigh. Reported on 05/03/2023) 2 each 0    metoprolol succinate XL (Toprol-XL) 50 mg 24 hr tablet TAKE 1 TABLET (50 MG) BY MOUTH EVERY DAY DO NOT CRUSH OR CHEW (Patient not taking: Reported on 05/03/2023) 90 tablet 3    omeprazole (PriLOSEC) 20 mg DR capsule Take 20 mg by mouth before breakfast. Do not crush or chew. (Patient not taking: Reported on 05/03/2023)         Objective   Visit Vitals  BP (!) 156/96   Pulse 60   Temp 36.4 C (97.6 F) (Temporal)   Resp 18   Ht 1.803 m   Wt 113.4 kg   SpO2 99%   BMI 34.87 kg/m   BSA 2.38 m       Physical Exam  Awake and alert, VSS, NAD. NCAT, PERRLA,  EOMI, sclera non-icteric, conjunctiva pink without swelling or drainage,  visual accuity grossly normal. EAC patent, T.M.'s intact with all landmarks identified, no erythema or bulging, normal mobility. Throat with no erythema or exudate, soft palate midline and raises symmetrically, dental in good repair.Neck: Supple, no tenderness or spasm,  no adenopathy, no thyromegaly, no masses, no carotid bruits, trachea is midline and freely movable.Lungs: Chest moving equally on both sides.  No retraction or stridor. No increase in AP diameter of the chest.  No wheezing, rhonchi, rales, or crackles.  Breath sounds are well heard. Heart: RRR without murmur, normal S1S2, negative rubs, clicks, gallops, negative S3 or S4, PMI is in the L 5th ICS & MCL.  ER records reviewed    Assessment/Plan   Miquan was seen today for follow-up.  Allergic reaction, initial encounter  -     amLODIPine (Norvasc) 10 mg tablet; Take 1 tablet (10 mg) by mouth once daily.  Primary hypertension (CMS-HCC)  Mixed hyperlipidemia (CMS-HCC)    1.  Allergic reaction, possible angioedema, presumably due to the benazepril component of the Lotrel although not definitive  Discussed the above, he will continue the amlodipine 10, that was refilled and resume the other medications other than the Lotrel, if good recheck back next month for his blood pressure, sooner if any problems or worsening, also if the buzzing in the ear persists or worsens  2.  Hypertension-elevated but again off his medication as noted  Patient will resume his medications as noted above and will follow-up next month  3.  Hyperlipidemia-off of his statin  Patient as above will resume his statin and will follow                   Lorain Childes, MD

## 2023-05-29 NOTE — Telephone Encounter (Signed)
Appointment:        Date of patient's next encounter in the current department:  06/06/2023   Date of patient's next encounter with the current provider:  06/06/2023   Date of patient's last encounter with the current provider: @Lastencthisprov @     Last BP:   BP Readings from Last 3 Encounters:   05/03/23 (!) 156/96   04/27/23 137/85   03/29/23 (!) 160/94       Last HGBA1C:   HgbA1C (%)   Date Value   12/21/2022 5.5       Labs  No components found for: "CREAT"  AST   Date Value Ref Range Status   11/01/2021 52 (H) 6 - 42 U/L Final   11/16/2020 35 6 - 42 U/L Final     AST (SGOT)   Date Value Ref Range Status   11/26/2021 53 (H) 15 - 37 IU/L Final     ALT   Date Value Ref Range Status   11/01/2021 104 (H) 0 - 55 U/L Final   11/16/2020 64 (H) 0 - 55 U/L Final     ALT (SGPT)   Date Value Ref Range Status   11/26/2021 99 (H) 0 - 55 IU/L Final     Comment:     Lab Director: Latanya Presser     No components found for: "TBILI"  Hemoglobin   Date Value Ref Range Status   11/01/2021 15.1 13.0 - 17.5 g/dL Final     Hgb   Date Value Ref Range Status   12/21/2022 14.8 13.0 - 17.7 g/dL Final     Hematocrit   Date Value Ref Range Status   11/01/2021 42.6 37.0 - 53.0 % Final     Hct   Date Value Ref Range Status   12/21/2022 44.0 37.5 - 51.0 % Final     Platelets   Date Value Ref Range Status   12/21/2022 157 150 - 450 x10E3/uL Final   11/01/2021 241 150 - 400 K/uL Final     No results found for: "PTT"  No results found for: "INR"  No results found for: "PROTIME"  No results found for: "PTADJUSTED"    Last Urine Drug Screen: on .  Narcotic Medications:    Directives/Controlled Substance Agreement   PMP Appropriate __YES      ___No

## 2023-06-06 ENCOUNTER — Ambulatory Visit
Admit: 2023-06-06 | Discharge: 2023-06-06 | Payer: PRIVATE HEALTH INSURANCE | Attending: Legal Medicine | Primary: Legal Medicine

## 2023-06-06 DIAGNOSIS — I10 Essential (primary) hypertension: Secondary | ICD-10-CM

## 2023-06-06 MED ORDER — triamterene-hydrochlorothiazid (Maxzide-25mg) 37.5-25 mg tablet
37.5-25 | ORAL_TABLET | Freq: Every day | ORAL | 11 refills | Status: AC
Start: 2023-06-06 — End: ?

## 2023-06-06 MED ORDER — amLODIPine (Norvasc) 5 mg tablet
5 | ORAL_TABLET | Freq: Every day | ORAL | 5 refills | Status: AC
Start: 2023-06-06 — End: 2023-12-03

## 2023-06-06 NOTE — Progress Notes (Signed)
[facility] FAMILY MEDICINE 101 MAIN  [facility] FAMILY MEDICINE 101 MAIN  [address]  [address]  [place] [address]  Dept: [phone]  Dept Fax: [phone]     Patient ID: Timothy Morris is a 52 y.o. male who presents for Follow-up (Bp check and no concerns ).    Subjective   HPI  Patient is here today for his checkup and medication follow-up, he is on the amlodipine 10 now along with his regular metoprolol, omeprazole and atorvastatin, feeling good although he has noticed some edema since his last visit, in his ankles and feet, also his left hand is still bothering him from his first visit, the brace is not helping,, some stress with family issues, also frustrated as he has trouble walking because of his back and has been gaining weight, he is the heaviest he has ever been and asked about options for that, he checks his pressure and it is up and down, usually checking it when he has a headache, no current chest pain or shortness of breath    Patient Active Problem List   Diagnosis    Primary hypertension (CMS-HCC)    Hyperlipidemia (CMS-HCC)    Fatty liver    Degenerative disc disease, lumbar    Class 1 obesity due to excess calories with serious comorbidity and body mass index (BMI) of 34.0 to 34.9 in adult    Lumbar radiculopathy    Lumbosacral spondylosis without myelopathy    Cervical radiculopathy    History of gout    Abnormal MRI, lumbar spine     Current Outpatient Medications   Medication Instructions    amLODIPine (NORVASC) 5 mg, oral, Daily    atorvastatin (LIPITOR) 40 mg, oral, Daily, To lower cholesterol    EPINEPHrine (EPIPEN) 0.3 mg, intramuscular, Once as needed, Call 911 after use.    metoprolol succinate XL (TOPROL-XL) 50 mg, oral, Daily, Do not crush or chew    omeprazole (PRILOSEC) 20 mg, oral, Daily before breakfast, Do not crush or chew.    triamterene-hydrochlorothiazid (Maxzide-25mg ) 37.5-25 mg tablet 1 tablet, oral, Daily     Allergies   Allergen Reactions    Ace Inhibitors Angioedema      Past Medical History:   Diagnosis Date    Fatty liver     History of gout     Primary hypertension (CMS-HCC)      Past Surgical History:   Procedure Laterality Date    ESOPHAGOGASTRODUODENOSCOPY  2023    INGUINAL HERNIA REPAIR      KNEE ARTHROSCOPY W/ DEBRIDEMENT Right     x2    LEFT COLECTOMY      Sigmoid    ROTATOR CUFF REPAIR Right      Family History   Problem Relation Name Age of Onset    Breast cancer Mother      No Known Problems Sister       Social History     Tobacco Use    Smoking status: Never    Smokeless tobacco: Never   Vaping Use    Vaping status: Never Used   Substance Use Topics    Alcohol use: Never    Drug use: Yes     Types: Marijuana     Immunization History   Administered Date(s) Administered    COVID-19 Pfizer Monovalent 09/19/2019, 10/29/2019    Covid-19,mrna,lnp-s,pf,65mcg/0.3ml 07/01/2020    Tdap 06/20/2017        ROS     Medications Discontinued During This Encounter  Medication Reason    diphenhydrAMINE (BenadryL) 25 mg capsule     amLODIPine (Norvasc) 10 mg tablet      Outpatient Medications Prior to Visit   Medication Sig Dispense Refill    atorvastatin (Lipitor) 40 mg tablet TAKE 1 TABLET (40 MG) BY MOUTH ONCE DAILY. TO LOWER CHOLESTEROL 90 tablet 3    EPINEPHrine (Epipen) 0.3 mg/0.3 mL auto-injector Inject entire contents of epinephrine auto-injector into thigh. Call 911 after use. 2 each 0    metoprolol succinate XL (Toprol-XL) 50 mg 24 hr tablet TAKE 1 TABLET (50 MG) BY MOUTH EVERY DAY DO NOT CRUSH OR CHEW 90 tablet 3    omeprazole (PriLOSEC) 20 mg DR capsule Take 20 mg by mouth before breakfast. Do not crush or chew.      amLODIPine (Norvasc) 10 mg tablet TAKE 1 TABLET BY MOUTH EVERY DAY 90 tablet 3    diphenhydrAMINE (BenadryL) 25 mg capsule Take 1 capsule (25 mg) by mouth every 8 (eight) hours for 12 doses. 12 capsule 0       Objective   Visit Vitals  BP (!) 160/80 (BP Location: Left arm, Patient Position: Sitting, BP Cuff Size: Large adult)   Pulse 61   Temp 36.7 C (98 F)  (Temporal)   Resp 18   Ht 1.8 m   Wt 113 kg   SpO2 99%   BMI 34.88 kg/m   BSA 2.38 m       Physical Exam  Awake and alert, VSS, NAD.   Neck: Supple, no tenderness or spasm,  no adenopathy, no thyromegaly, no masses, no carotid bruits, trachea is midline and freely movable.Lungs: Chest moving equally on both sides.  No retraction or stridor. No increase in AP diameter of the chest.  No wheezing, rhonchi, rales, or crackles.  Breath sounds are well heard. Heart: RRR without murmur, normal S1S2, negative rubs, clicks, gallops, negative S3 or S4, PMI is in the L 5th ICS & MCL.Extremities: No clubbing, cyanosis, there is noted 1+ pitting edema bilaterally of ankles and feet. Motor strength 5/5, no joint swelling, restrictions, nodules, deformities, amputations. Pulses were equal bilaterally. Skin: No rash    Assessment/Plan   Timothy Morris was seen today for follow-up.  Primary hypertension (CMS-HCC)  -     amLODIPine (Norvasc) 5 mg tablet; Take 1 tablet (5 mg) by mouth once daily.  -     triamterene-hydrochlorothiazid (Maxzide-25mg ) 37.5-25 mg tablet; Take 1 tablet by mouth once daily.  Allergic reaction, initial encounter  Carpal tunnel syndrome of left wrist  -     [facility]  [facility] Ref; Future  Class 1 obesity due to excess calories with serious comorbidity and body mass index (BMI) of 34.0 to 34.9 in adult    1.  Hypertension-not optimal with some edema now on the higher dose amlodipine and off of his ACE inhibitor due to the allergic reaction, not optimal and also worsened by his obesity and weight gain, allergic reaction resolved  Discussed the above, we will decrease the amlodipine to 5 mg and add in Dyazide once a day, recheck back in a month for his BP and will discuss initiating a GLP medication at that time if covered, call if any problems  2.  Left wrist carpal tunnel syndrome, not improved with his bracing  Discussed above and referred him to orthopedics for a consultation    Patient Health  Questionnaire-2 Score: 0   Interpretation: Negative screening.     Follow-up & Interventions:  Maintain annual screening - No additional Follow-up required          Lorain Childes, MD

## 2023-06-26 ENCOUNTER — Encounter: Payer: PRIVATE HEALTH INSURANCE | Attending: Legal Medicine | Primary: Legal Medicine

## 2023-06-27 ENCOUNTER — Ambulatory Visit
Admit: 2023-06-27 | Discharge: 2023-06-27 | Payer: PRIVATE HEALTH INSURANCE | Attending: Hand Surgery | Primary: Legal Medicine

## 2023-06-27 ENCOUNTER — Encounter: Admit: 2023-06-27 | Primary: Legal Medicine

## 2023-06-27 DIAGNOSIS — G5602 Carpal tunnel syndrome, left upper limb: Secondary | ICD-10-CM

## 2023-06-27 DIAGNOSIS — M1812 Unilateral primary osteoarthritis of first carpometacarpal joint, left hand: Secondary | ICD-10-CM

## 2023-06-27 NOTE — Progress Notes (Signed)
[address]  [place], [place] [address]    [address]  [place], [place] [address]    Phone: [phone]  Fax:      [phone]  E-mail: [email or web address]      HPI  Timothy Morris is a 53 y.o. male with a chief complaint of left thumb, index and middle finger numbness and thenar pain.  Symptoms for 6 months without an injury. It makes it difficult to lift a heavy pan.     It wakes him up at night.  He also has some left radial hand pain, but the numbness and tingling is what bothers him the most.    Hand dominance: right  Duration of symptoms: 6 months  Aggravating factors: sleeping driving  Neck pain:  no  Pain medications: aleve for back does not help hands.   Immobilization: wrist brace at home w/o relief for a few months  Steroid injections for this problem:  Tests or imaging studies:  EMG  Occupational or Physical Therapy: no  Prior upper extremity fractures: no  Prior upper extremity surgery: no  Occupation:  cook    Past Medical History:   Diagnosis Date    Fatty liver     History of gout     Primary hypertension        Past Surgical History:   Procedure Laterality Date    ESOPHAGOGASTRODUODENOSCOPY  2023    INGUINAL HERNIA REPAIR      KNEE ARTHROSCOPY W/ DEBRIDEMENT Right     x2    LEFT COLECTOMY      Sigmoid    ROTATOR CUFF REPAIR Right        Social History     Occupational History    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    metoprolol succinate XL  (TOPROL-XL) 50 mg, oral, Daily, Do not crush or chew    omeprazole (PRILOSEC) 20 mg, oral, Daily before breakfast, Do not crush or chew.    triamterene-hydrochlorothiazid (Maxzide-25mg ) 37.5-25 mg tablet 1 tablet, oral, Daily       Allergies   Allergen Reactions    Ace Inhibitors Angioedema       Exam  Ht Readings from Last 1 Encounters:   06/06/23 1.8 m     Wt Readings from Last 1 Encounters:   06/06/23 113 kg     BMI Readings from Last 1 Encounters:   06/06/23 34.88 kg/m     Comfortable. Normal mood. Cooperative with exam.   Mildly decreased range of motion neck with mild pain.  Positive Spurling sign to the left hand.    Both hands are examined.  No edema, deformity or atrophy of the hand or wrist.    Positive Tinel sign over left on the right carpal tunnel.  Positive  Phalen's test on left on the right.    Positive thumb CMC grind test on left on the right.    Not tender either radial styloid.    Fairly symmetric painless wrist motion for flexion extension bilaterally.    Motor strength is 5/5 bilaterally for grip and finger abduction and abductor pollicis brevis with pain on the left with abductor pollicis brevis strength testing.    2-point discrimination is 5 mm on the right hand in all fingers.  On the left hand is 7 mm for the index and middle finger and 5 mm for the other fingers.    Heart regular rate and rhythm.  Lungs clear to auscultation bilaterally.  Abdomen soft nontender nondistended.    Review of systems  No chest pain, shortness of breath or abdominal pain.     Diagnostic Tests  XR HAND LEFT 3+ VIEWS  Imaging Result: 06/27/2023    Indications: Left radial hand pain.    Findings: PA, lateral and oblique views of the left hand shows No   fractures.  No scapholunate ligament widening.  MCP and IP joints are well   aligned.  Severe thumb CMC joint space narrowing with small osteophytes.    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    Assessment & Plan  Carpal tunnel syndrome of left wrist  [G56.02]   1. Carpal tunnel syndrome of left wrist    2. Arthritis of carpometacarpal [facility]) joint of left thumb      Left hand pain numbness and tingling may be multifactorial.    I think a good portion of symptoms is coming from carpal tunnel syndrome, a pinched nerve in the wrist.  Some of his numbness may be coming from his cervical spine.  I think his radial hand pain may be coming from thumb CMC arthritis.    We discussed various treatment options or additional diagnostic testing, but he just wants to see if he can get this fixed.  He would like proceed with surgery for left endoscopic carpal tunnel release.  I told him that this may not get rid of all of his numbness and if it does not we will have to look more carefully at his cervical spine as an additional source of numbness.    I also told him this will probably not help his thumb basilar pain which is probably from thumb CMC arthritis.  He understands that but says that the numbness bothers him the most.    I will schedule him for the upcoming weeks.  Follow-up to weeks after surgery for suture removal.  He may not be able to go back to work for a couple weeks lifting heavy things but if there is light duty work he could do it earlier.     I explained to the patient that they have carpal tunnel syndrome which is due to a pinched nerve in the wrist. Mild cases can be treated conservatively, but when severe cases are left untreated, permanent weakness or numbness can occur. Treatment options include: observation, a wrist splint at night, oral medications, a steroid injection or surgery.    I have explained the surgery and risks including by not limited to infection, bleeding, damage to nerves tendons or blood vessels. I think these are unlikely complications. I think the patient will have palmar pain for a few months following surgery. They may require hand therapy to get back their strength and motion following surgery. The sensation in the fingers may  take a few months to fully improve.     Signed by:  Luana Shu, MD  [email or web address]   Dictated with voice recognition. Please contact me if you feel there are any errors.

## 2023-06-27 NOTE — Telephone Encounter (Signed)
Patient will have surgery with Dr. Donette Larry on 07/13/2023 at [facility].

## 2023-07-11 ENCOUNTER — Ambulatory Visit
Admit: 2023-07-11 | Discharge: 2023-07-11 | Payer: PRIVATE HEALTH INSURANCE | Attending: Legal Medicine | Primary: Legal Medicine

## 2023-07-11 DIAGNOSIS — I10 Essential (primary) hypertension: Secondary | ICD-10-CM

## 2023-07-11 MED ORDER — semaglutide, weight loss, (Wegovy) 0.25 mg/0.5 mL pen injector
0.25 | SUBCUTANEOUS | 3 refills | 28.00000 days | Status: DC
Start: 2023-07-11 — End: 2023-08-25

## 2023-07-11 NOTE — Telephone Encounter (Signed)
Looks like pharmacy sent in error. Looks like we started patient on wegovy as of today   Please refuse

## 2023-07-11 NOTE — Telephone Encounter (Signed)
Please refuse

## 2023-07-11 NOTE — Progress Notes (Signed)
[facility] FAMILY MEDICINE [address] MAIN  [facility] FAMILY MEDICINE [address] MAIN  [address]  [address]  [place] [address]  Dept: [phone]  Dept Fax: [phone]     Patient ID: Timothy Morris is a 53 y.o. male who presents for Follow-up (No concerns ).    Subjective   HPI  Patient is here today for his follow-up, he is on the Maxide now along with the amlodipine 5 and metoprolol XL 50 for his blood pressure, atorvastatin for his cholesterol, he is having the carpal tunnel surgery on his left hand in a couple of days at [facility], he also is interested in starting the weight loss medication if he can, he still feels a little tightness or swelling in his feet but his ankles are better and nothing significant, no pain, he does report that he has a history of gout but not in several years, he thought he might have felt an attack coming on recently but it stopped and has not been a problem.    Patient Active Problem List   Diagnosis    Primary hypertension    Hyperlipidemia    Fatty liver    Degenerative disc disease, lumbar    Class 1 obesity due to excess calories with serious comorbidity and body mass index (BMI) of 34.0 to 34.9 in adult    Lumbar radiculopathy    Lumbosacral spondylosis without myelopathy    Cervical radiculopathy    History of gout    Abnormal MRI, lumbar spine     Current Outpatient Medications   Medication Instructions    amLODIPine (NORVASC) 5 mg, oral, Daily    atorvastatin (LIPITOR) 40 mg, oral, Daily, To lower cholesterol    EPINEPHrine (EPIPEN) 0.3 mg, intramuscular, Once as needed, Call 911 after use.    metoprolol succinate XL (TOPROL-XL) 50 mg, oral, Daily, Do not crush or chew    omeprazole (PRILOSEC) 20 mg, oral, Daily before breakfast, Do not crush or chew.    triamterene-hydrochlorothiazid (Maxzide-25mg ) 37.5-25 mg tablet 1 tablet, oral, Daily    Wegovy 0.25 mg, subcutaneous, Every 7 days     Allergies   Allergen Reactions    Ace Inhibitors Angioedema     Past Medical History:    Diagnosis Date    Fatty liver     History of gout     Primary hypertension      Past Surgical History:   Procedure Laterality Date    ESOPHAGOGASTRODUODENOSCOPY  2023    INGUINAL HERNIA REPAIR      KNEE ARTHROSCOPY W/ DEBRIDEMENT Right     x2    LEFT COLECTOMY      Sigmoid    ROTATOR CUFF REPAIR Right      Family History   Problem Relation Name Age of Onset    Breast cancer Mother      No Known Problems Sister       Social History     Tobacco Use    Smoking status: Never    Smokeless tobacco: Never   Vaping Use    Vaping status: Never Used   Substance Use Topics    Alcohol use: Never    Drug use: Yes     Types: Marijuana     Immunization History   Administered Date(s) Administered    COVID-19 Pfizer Monovalent 09/19/2019, 10/29/2019    Covid-19,mrna,lnp-s,pf,44mcg/0.3ml 07/01/2020    Tdap 06/20/2017        ROS     There are no discontinued  medications.  Outpatient Medications Prior to Visit   Medication Sig Dispense Refill    amLODIPine (Norvasc) 5 mg tablet Take 1 tablet (5 mg) by mouth once daily. 30 tablet 5    atorvastatin (Lipitor) 40 mg tablet TAKE 1 TABLET (40 MG) BY MOUTH ONCE DAILY. TO LOWER CHOLESTEROL 90 tablet 3    EPINEPHrine (Epipen) 0.3 mg/0.3 mL auto-injector Inject entire contents of epinephrine auto-injector into thigh. Call 911 after use. 2 each 0    metoprolol succinate XL (Toprol-XL) 50 mg 24 hr tablet TAKE 1 TABLET (50 MG) BY MOUTH EVERY DAY DO NOT CRUSH OR CHEW 90 tablet 3    omeprazole (PriLOSEC) 20 mg DR capsule Take 20 mg by mouth before breakfast. Do not crush or chew.      triamterene-hydrochlorothiazid (Maxzide-25mg ) 37.5-25 mg tablet Take 1 tablet by mouth once daily. 30 tablet 11       Objective   Visit Vitals  BP (!) 143/83 (BP Location: Right arm, Patient Position: Sitting, BP Cuff Size: Large adult)   Pulse 65   Temp 36.2 C (97.2 F) (Temporal)   Resp 18   Ht 1.8 m   Wt 113 kg   SpO2 99%   BMI 34.88 kg/m   BSA 2.38 m       Physical Exam  Awake and alert, VSS, NAD.   Neck:  Supple, no tenderness or spasm,  no adenopathy, no thyromegaly, no masses, no carotid bruits, trachea is midline and freely movable.Lungs: Chest moving equally on both sides.  No retraction or stridor. No increase in AP diameter of the chest.  No wheezing, rhonchi, rales, or crackles.  Breath sounds are well heard. Heart: RRR without murmur, normal S1S2, negative rubs, clicks, gallops, negative S3 or S4, PMI is in the L 5th ICS & MCL.Extremities: No clubbing, cyanosis, edema. Motor strength 5/5, no joint swelling, restrictions, nodules, deformities, amputations. Pulses were equal bilaterally. Skin: No rash    Assessment/Plan   Timothy Morris was seen today for follow-up.  Primary hypertension  -     semaglutide, weight loss, (Wegovy) 0.25 mg/0.5 mL pen injector; Inject 0.5 mL (0.25 mg) under the skin every 7 (seven) days.  Class 1 obesity due to excess calories with serious comorbidity and body mass index (BMI) of 34.0 to 34.9 in adult  -     semaglutide, weight loss, (Wegovy) 0.25 mg/0.5 mL pen injector; Inject 0.5 mL (0.25 mg) under the skin every 7 (seven) days.  History of gout  -     Uric acid    1.  Hypertension, improved but not optimal on his current medication regimen, his edema has improved some but still feeling it somewhat in his feet, also a history of gout and questionably a recent attack coming on which did not become a full-blown attack, as noted he is now on a diuretic as well as the other medications for his blood pressure  Discussed the above, sent off a uric acid, if elevated will have to stop the Maxide and try him on a different BP regimen without a diuretic  2.  Obesity, requesting help as does have comorbid hypertension as noted  Discussed the above, prescribed Wegovy 0.25 and recommend he schedule back a month after starting it once approved, call back if any problems.    Patient Health Questionnaire-2 Score: 0   Interpretation: Negative screening.     Follow-up & Interventions: Maintain annual  screening - No additional Follow-up required  Lorain Childes, MD

## 2023-07-11 NOTE — Result Quicknote (Signed)
Patient's uric acid test has come back good, advise him of the results, at this point continue current medications but if he feels any gout attacks coming on let me know and we will need to then adjust his medications as discussed

## 2023-07-11 NOTE — Telephone Encounter (Signed)
Appointment:        Date of patient's next encounter in the current department:  Visit date not found   Date of patient's next encounter with the current provider:  Visit date not found   Date of patient's last encounter in the current department: 07/11/2023      Last BP:   BP Readings from Last 3 Encounters:   07/11/23 (!) 143/83   06/06/23 (!) 160/80   05/03/23 (!) 156/96       Last HGBA1C:   HgbA1C (%)   Date Value   12/21/2022 5.5       Labs  TSH   Date Value Ref Range Status   12/21/2022 1.160 0.450 - 4.500 uIU/mL Final     Comment:     No apparent thyroid disorder. Additional testing not indicated. In  rare instances, Secondary Hypothyroidism as well as Subclinical  Hypothyroidism have been reported in some patients with normal TSH  values.       Lab Results   Component Value Date    CALCIUM 9.8 12/21/2022    ALBUMIN 4.2 11/26/2021    NA 138 12/21/2022    K 4.4 12/21/2022    CO2 22 12/21/2022    CL 103 12/21/2022    BUN 15 12/21/2022    CREATININE 1.06 12/21/2022      AST   Date Value Ref Range Status   11/01/2021 52 (H) 6 - 42 U/L Final   11/16/2020 35 6 - 42 U/L Final     AST (SGOT)   Date Value Ref Range Status   11/26/2021 53 (H) 15 - 37 IU/L Final     ALT   Date Value Ref Range Status   11/01/2021 104 (H) 0 - 55 U/L Final   11/16/2020 64 (H) 0 - 55 U/L Final     ALT (SGPT)   Date Value Ref Range Status   11/26/2021 99 (H) 0 - 55 IU/L Final     Comment:     Lab Director: Latanya Presser     No components found for: "TBILI"  Hemoglobin   Date Value Ref Range Status   11/01/2021 15.1 13.0 - 17.5 g/dL Final     Hgb   Date Value Ref Range Status   12/21/2022 14.8 13.0 - 17.7 g/dL Final     Hematocrit   Date Value Ref Range Status   11/01/2021 42.6 37.0 - 53.0 % Final     Hct   Date Value Ref Range Status   12/21/2022 44.0 37.5 - 51.0 % Final     Platelets   Date Value Ref Range Status   12/21/2022 157 150 - 450 x10E3/uL Final   11/01/2021 241 150 - 400 K/uL Final     No results found for: "PTT"  No results  found for: "INR"  No results found for: "PROTIME"  No results found for: "PTADJUSTED"    Last Urine Drug Screen: on .  Narcotic Medications:    Directives/Controlled Substance Agreement   PMP Appropriate __YES      ___No

## 2023-07-11 NOTE — Telephone Encounter (Signed)
Please refuse again.

## 2023-07-12 LAB — URIC ACID: Uric Acid: 7.5 mg/dL (ref 3.8–8.4)

## 2023-07-12 NOTE — Telephone Encounter (Signed)
Patient notified

## 2023-07-12 NOTE — Telephone Encounter (Signed)
-----   Message from Dr. Lorain Childes, MD sent at 07/12/2023  9:13 AM EST -----  Patient's uric acid test has come back good, advise him of the results, at this point continue current medications but if he feels any gout attacks coming on let me know and we will need to then adjust his medications as discussed

## 2023-07-13 ENCOUNTER — Encounter: Discharge: 2023-07-14 | Payer: PRIVATE HEALTH INSURANCE | Attending: Hand Surgery | Primary: Legal Medicine

## 2023-07-13 DIAGNOSIS — G5602 Carpal tunnel syndrome, left upper limb: Secondary | ICD-10-CM

## 2023-07-28 ENCOUNTER — Ambulatory Visit
Admit: 2023-07-28 | Discharge: 2023-07-28 | Payer: PRIVATE HEALTH INSURANCE | Attending: Hand Surgery | Primary: Legal Medicine

## 2023-07-28 DIAGNOSIS — G5602 Carpal tunnel syndrome, left upper limb: Secondary | ICD-10-CM

## 2023-07-28 NOTE — Progress Notes (Signed)
[address]  [place], [place] [address]    [address]  [place], [place] [address]    Phone: [phone]  Fax:      [phone]  E-mail: [email or web address]      HPI  Timothy Morris is a 53 y.o. male with a chief complaint of left hand pain.  He is 2 weeks out from left endoscopic carpal tunnel release.  Pain is well-controlled.  He has some palmar soreness.  He still has numbness and tingling in the left index and middle finger more than the thumb.  It is about the same as what it was before surgery.    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  He keeps his wound covered and dry.    Past Medical History:   Diagnosis Date    Fatty liver     History of gout     Primary hypertension        Past Surgical History:   Procedure Laterality Date    CARPAL TUNNEL RELEASE Left 2025    endoscopic    ESOPHAGOGASTRODUODENOSCOPY  2023    INGUINAL HERNIA REPAIR      KNEE ARTHROSCOPY W/ DEBRIDEMENT Right     x2    LEFT COLECTOMY      Sigmoid    ROTATOR CUFF REPAIR Right        Social History     Occupational History    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    metoprolol succinate XL (TOPROL-XL) 50 mg, oral, Daily, Do not crush or chew    omeprazole (PRILOSEC) 20 mg, oral, Daily before breakfast, Do not crush or chew.    triamterene-hydrochlorothiazid (Maxzide-25mg ) 37.5-25 mg tablet 1 tablet, oral, Daily    Wegovy 0.25 mg, subcutaneous, Every 7 days       Allergies    Allergen Reactions    Ace Inhibitors Angioedema       Exam  Ht Readings from Last 1 Encounters:   07/11/23 1.8 m     Wt Readings from Last 1 Encounters:   07/11/23 113 kg     BMI Readings from Last 1 Encounters:   07/11/23 34.88 kg/m     Comfortable. Normal mood. Cooperative with exam.     Both hands are examined.  Left volar wrist incision intact.  Mild left palmar edema and ecchymosis in the distal forearm.    He can make a fist and extend all his fingers bilaterally.    Decreased sensation to light touch left index and middle finger more than the left thumb compared to the right hand.  Capillary fill brisk in all fingers bilaterally.  Bilateral radial pulses are palpable.    Diagnostic Tests  XR HAND LEFT 3+ VIEWS  Imaging Result:  06/27/2023    Indications: Left radial hand pain.    Findings: PA, lateral and oblique views of the left hand shows No   fractures.  No scapholunate ligament widening.  MCP and IP joints are well   aligned.  Severe thumb CMC joint space narrowing with small osteophytes.    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 Carpal tunnel syndrome of left wrist    2. Arthritis of carpometacarpal (CMC) joint of left thumb      Doing well 2 weeks out from left endoscopic carpal tunnel release.    He should avoid heavy lifting with the left hand for another couple weeks until the wound is fully healed.  He can work on finger motion.    Sutures were removed. He can wash the wound normally with soap and water.  Do not soak it in a pool or hot tub for another 2 weeks until the wound is fully healed.    I told him is normal to have palmar soreness and swelling.  He also has some pain from left thumb CMC arthritis.  I want to hold off on giving a steroid injection for that for another month.    He is going to follow-up in 1 month for another exam.  I told him that it could take sometimes up to 6 or 7 months for his  finger sensation to reach maximal improvement after this type of surgery.  Signed by:  Luana Shu, MD  [email or web address]   Dictated with voice recognition. Please contact me if you feel there are any errors.

## 2023-08-25 ENCOUNTER — Ambulatory Visit
Admit: 2023-08-25 | Discharge: 2023-08-25 | Payer: PRIVATE HEALTH INSURANCE | Attending: Hand Surgery | Primary: Legal Medicine

## 2023-08-25 DIAGNOSIS — G5602 Carpal tunnel syndrome, left upper limb: Secondary | ICD-10-CM

## 2023-08-25 MED ORDER — triamcinolone acetonide (Kenalog-40) injection 20 mg
40 | Freq: Once | INTRAMUSCULAR | Status: AC | PRN
Start: 2023-08-25 — End: 2023-08-25
  Administered 2023-08-25: 15:00:00 20 mg via INTRA_ARTICULAR

## 2023-08-25 MED ORDER — ibuprofen 600 mg tablet
600 | ORAL_TABLET | Freq: Three times a day (TID) | ORAL | 1 refills | Status: AC | PRN
Start: 2023-08-25 — End: ?

## 2023-08-25 NOTE — Progress Notes (Signed)
 [address]  [place], [place] [address]    [address]  [place], [place] [address]    Phone: [phone]  Fax:      [phone]  E-mail: [email or web address]      HPI  Timothy Morris is a 53 y.o. male with a chief complaint of  left hand pain.  He is 6 weeks out from left endoscopic carpal tunnel release.  Pain is well-controlled.  He has palmar soreness and thumb basilar pain.  He still has numbness and tingling in the left index and middle finger more than the thumb.  It is about the same as what it was before surgery.     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  He keeps his wound covered and dry.    Past Medical History:   Diagnosis Date    Fatty liver     History of gout     Primary hypertension        Past Surgical History:   Procedure Laterality Date    CARPAL TUNNEL RELEASE Left 2025    endoscopic    ESOPHAGOGASTRODUODENOSCOPY  2023    INGUINAL HERNIA REPAIR      KNEE ARTHROSCOPY W/ DEBRIDEMENT Right     x2    LEFT COLECTOMY      Sigmoid    ROTATOR CUFF REPAIR Right        Social History     Occupational History    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    ibuprofen 600 mg, oral, Every 8 hours PRN, Take with food.  Discontinue if stomach upset.    metoprolol succinate XL (TOPROL-XL) 50 mg, oral, Daily, Do not crush or chew    omeprazole (PRILOSEC) 20 mg, oral, Daily before breakfast, Do not crush or chew.    triamterene-hydrochlorothiazid  (Maxzide-25mg ) 37.5-25 mg tablet 1 tablet, oral, Daily       Allergies   Allergen Reactions    Ace Inhibitors Angioedema       Exam  Ht Readings from Last 1 Encounters:   07/11/23 1.8 m     Wt Readings from Last 1 Encounters:   07/11/23 113 kg     BMI Readings from Last 1 Encounters:   07/11/23 34.88 kg/m     Comfortable. Normal mood. Cooperative with exam.     Both hands are examined.  No atrophy of either hand.  Mild left palmar edema.  Left volar wrist surgical incision is healed.  Positive thumb CMC grind test on the left not the right.    Decreased sensation to light touch left thumb, index and middle fingers compared to the right hand.  No Tinel sign at either carpal tunnel.  Positive Tinel sign left middle finger in the mid  palm.    Bilateral radial pulse are palpable.    Mildly limited left wrist motion compared to full painless right wrist motion.    Grip strength is 70 pounds on the right and 25 pounds on the left.     Diagnostic Tests  XR HAND LEFT 3+ VIEWS  Imaging Result: 06/27/2023    Indications: Left radial hand pain.    Findings: PA, lateral and oblique views of the left hand shows No   fractures.  No scapholunate ligament widening.  MCP and IP joints are well   aligned.  Severe thumb CMC joint space narrowing with small osteophytes.    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 Carpal tunnel syndrome of left wrist    2. Arthritis of carpometacarpal (CMC) joint of left thumb      Persistent left wrist and hand pain as well as numbness and tingling 6 weeks out from left endoscopic carpal tunnel release surgery.  I think a good portion of his pain is from left thumb CMC arthritis.    He decided on trying a steroid injection which I gave him to the left thumb CMC joint to see if this would calm down some of his pain.  We will hold off on formal occupational therapy for now to see how this injection helps him.    I think  his numbness may also take another few months to reach maximal improvement.  He has a Tinel's sign in the mid palm going to the middle finger.  I think the nerve has recovered up to that level.  I think will continue to recover at a rate about 1 inch per month until he gets to the tips of his fingers and his numbness feels better.    He is going to follow-up in 8 weeks for another exam.  If there is any problems and call me earlier.    He wanted something for pain.  I gave him prescription for ibuprofen to take with food if needed for pain as long as it does not bother his stomach.    Patient ID: Timothy Morris is a 53 y.o. male.    S Inj/Asp: L thumb CMC  Details: 25 G needle  Medications: 20 mg triamcinolone acetonide 40 mg/mL    I explained the risks of injection including pain, infection, bleeding, numbness, need for repeat procedure and damage to nerves, tendons, ligaments and blood vessels.  After verbal consent was obtained and after sterile prep with alcohol and chlorhexidine, ethyl chloride spray was used to anesthetize the skin.  The skin was reprepped with alcohol. Injection into the thumb CMC joint with a 25 Ga needle.   0.27ml 1% plain lidocaine injected with the steroid.  A Band-Aid was applied.  The patient tolerated it well.  I told the patient the steroid will take 3-5 days to take full effect.  Pain may be increased for that time period.        Signed by:  Luana Shu, MD  [email or web address]   Dictated with voice recognition. Please contact me if you feel there are any errors.

## 2023-08-28 MED ORDER — semaglutide, weight loss, (Wegovy) 0.25 mg/0.5 mL pen injector
0.25 | SUBCUTANEOUS | 6 refills | 28.00000 days | Status: DC
Start: 2023-08-28 — End: 2023-09-26

## 2023-08-28 NOTE — Telephone Encounter (Signed)
 Appointment:        Date of patient's next encounter in the current department:  Visit date not found   Date of patient's next encounter with the current provider:  Visit date not found   Date of patient's last encounter in the current department: 07/11/2023      Last BP:   BP Readings from Last 3 Encounters:   07/11/23 (!) 143/83   06/06/23 (!) 160/80   05/03/23 (!) 156/96       Last HGBA1C:   HgbA1C (%)   Date Value   12/21/2022 5.5       Labs  TSH   Date Value Ref Range Status   12/21/2022 1.160 0.450 - 4.500 uIU/mL Final     Comment:     No apparent thyroid disorder. Additional testing not indicated. In  rare instances, Secondary Hypothyroidism as well as Subclinical  Hypothyroidism have been reported in some patients with normal TSH  values.       Lab Results   Component Value Date    CALCIUM 9.8 12/21/2022    ALBUMIN 4.2 11/26/2021    NA 138 12/21/2022    K 4.4 12/21/2022    CO2 22 12/21/2022    CL 103 12/21/2022    BUN 15 12/21/2022    CREATININE 1.06 12/21/2022      AST   Date Value Ref Range Status   11/01/2021 52 (H) 6 - 42 U/L Final   11/16/2020 35 6 - 42 U/L Final     AST (SGOT)   Date Value Ref Range Status   11/26/2021 53 (H) 15 - 37 IU/L Final     ALT   Date Value Ref Range Status   11/01/2021 104 (H) 0 - 55 U/L Final   11/16/2020 64 (H) 0 - 55 U/L Final     ALT (SGPT)   Date Value Ref Range Status   11/26/2021 99 (H) 0 - 55 IU/L Final     Comment:     Lab Director: Latanya Presser     No components found for: "TBILI"  Hemoglobin   Date Value Ref Range Status   11/01/2021 15.1 13.0 - 17.5 g/dL Final     Hgb   Date Value Ref Range Status   12/21/2022 14.8 13.0 - 17.7 g/dL Final     Hematocrit   Date Value Ref Range Status   11/01/2021 42.6 37.0 - 53.0 % Final     Hct   Date Value Ref Range Status   12/21/2022 44.0 37.5 - 51.0 % Final     Platelets   Date Value Ref Range Status   12/21/2022 157 150 - 450 x10E3/uL Final   11/01/2021 241 150 - 400 K/uL Final     No results found for: "PTT"  No results  found for: "INR"  No results found for: "PROTIME"  No results found for: "PTADJUSTED"    Last Urine Drug Screen: on .  Narcotic Medications:    Directives/Controlled Substance Agreement   PMP Appropriate __YES      ___No

## 2023-08-28 NOTE — Telephone Encounter (Signed)
 Patient is calling about the Rx and how the insurance company needs a PA for the medication          Department Name: [address]    Patient: Timothy Morris  MRN: [id number]  Agent: Bonnee Quin    [facility] Scheduling Message    Patient called to request a refill of:  [MEDICATION NAMES HERE]  Semaglutide . Weight loss (Wegovy) 0.25mg /0.5 ml pen injector      Pharmacy:  [facility]     Informed patient it can take 3 business days to send prescription refill to pharmacy.    Call back number: [phone]

## 2023-09-25 NOTE — Telephone Encounter (Signed)
 Patient called stating that insurance is needing additional information on prior authorization for Wegovy . States that he has called a couple times for update.   Would like a call back at [phone]

## 2023-09-26 MED ORDER — semaglutide, weight loss, (Wegovy) 0.25 mg/0.5 mL pen injector
0.25 | SUBCUTANEOUS | 6 refills | 28.00000 days | Status: DC
Start: 2023-09-26 — End: 2023-11-27

## 2023-09-26 NOTE — Telephone Encounter (Signed)
 Appointment:        Date of patient's next encounter in the current department:  Visit date not found   Date of patient's next encounter with the current provider:  Visit date not found   Date of patient's last encounter in the current department: 07/11/2023      Last BP:   BP Readings from Last 3 Encounters:   07/11/23 (!) 143/83   06/06/23 (!) 160/80   05/03/23 (!) 156/96       Last HGBA1C:   HgbA1C (%)   Date Value   12/21/2022 5.5       Labs  TSH   Date Value Ref Range Status   12/21/2022 1.160 0.450 - 4.500 uIU/mL Final     Comment:     No apparent thyroid disorder. Additional testing not indicated. In  rare instances, Secondary Hypothyroidism as well as Subclinical  Hypothyroidism have been reported in some patients with normal TSH  values.       Lab Results   Component Value Date    CALCIUM 9.8 12/21/2022    ALBUMIN 4.2 11/26/2021    NA 138 12/21/2022    K 4.4 12/21/2022    CO2 22 12/21/2022    CL 103 12/21/2022    BUN 15 12/21/2022    CREATININE 1.06 12/21/2022      AST   Date Value Ref Range Status   11/01/2021 52 (H) 6 - 42 U/L Final   11/16/2020 35 6 - 42 U/L Final     AST (SGOT)   Date Value Ref Range Status   11/26/2021 53 (H) 15 - 37 IU/L Final     ALT   Date Value Ref Range Status   11/01/2021 104 (H) 0 - 55 U/L Final   11/16/2020 64 (H) 0 - 55 U/L Final     ALT (SGPT)   Date Value Ref Range Status   11/26/2021 99 (H) 0 - 55 IU/L Final     Comment:     Lab Director: Latanya Presser     No components found for: "TBILI"  Hemoglobin   Date Value Ref Range Status   11/01/2021 15.1 13.0 - 17.5 g/dL Final     Hgb   Date Value Ref Range Status   12/21/2022 14.8 13.0 - 17.7 g/dL Final     Hematocrit   Date Value Ref Range Status   11/01/2021 42.6 37.0 - 53.0 % Final     Hct   Date Value Ref Range Status   12/21/2022 44.0 37.5 - 51.0 % Final     Platelets   Date Value Ref Range Status   12/21/2022 157 150 - 450 x10E3/uL Final   11/01/2021 241 150 - 400 K/uL Final     No results found for: "PTT"  No results  found for: "INR"  No results found for: "PROTIME"  No results found for: "PTADJUSTED"    Last Urine Drug Screen: on .  Narcotic Medications:    Directives/Controlled Substance Agreement   PMP Appropriate __YES      ___No

## 2023-10-20 ENCOUNTER — Encounter: Payer: PRIVATE HEALTH INSURANCE | Attending: Hand Surgery | Primary: Family

## 2023-10-26 ENCOUNTER — Ambulatory Visit
Admit: 2023-10-26 | Discharge: 2023-10-26 | Payer: PRIVATE HEALTH INSURANCE | Attending: Legal Medicine | Primary: Family

## 2023-10-26 DIAGNOSIS — E66811 Obesity, class 1: Secondary | ICD-10-CM

## 2023-10-26 MED ORDER — semaglutide, weight loss, (Wegovy) 0.5 mg/0.5 mL pen injector
0.5 | SUBCUTANEOUS | 3 refills | 28.00000 days | Status: DC
Start: 2023-10-26 — End: 2023-11-27

## 2023-10-26 NOTE — Progress Notes (Signed)
 [facility]  [facility]  [address]  [address]  [place] [address]  Dept: [phone]  Dept Fax: [phone]     Patient ID: Timothy Morris is a 53 y.o. male who presents for Follow-up.    Subjective   HPI  Patient is here today for his checkup and medication follow-up, he was prescribed Wegovy to start back in January but there were delays because of his insurance so he just started it last month, he has taken 4 doses and doing well, he has lost about 8 pounds on his scale, some mild nausea and constipation but no other significant side effects, trying to exercise and hydrate, no other medication changes, he did have his carpal tunnel surgery in February which went well    Problem List[1]  Current Outpatient Medications   Medication Instructions    amLODIPine (NORVASC) 5 mg, oral, Daily    atorvastatin (LIPITOR) 40 mg, oral, Daily, To lower cholesterol    EPINEPHrine (EPIPEN) 0.3 mg, intramuscular, Once as needed, Call 911 after use.    ibuprofen 600 mg, oral, Every 8 hours PRN, Take with food.  Discontinue if stomach upset.    metoprolol succinate XL (TOPROL-XL) 50 mg, oral, Daily, Do not crush or chew    omeprazole (PRILOSEC) 20 mg, Daily before breakfast    triamterene-hydrochlorothiazid (Maxzide-25mg ) 37.5-25 mg tablet 1 tablet, oral, Daily    Wegovy 0.25 mg, subcutaneous, Every 7 days    Wegovy 0.5 mg, subcutaneous, Every 7 days     Allergies[2]  Medical History[3]  Surgical History[4]  Family History[5]  Social History[6]  Immunization History   Administered Date(s) Administered    COVID-19 Pfizer Monovalent 09/19/2019, 10/29/2019    Covid-19,mrna,lnp-s,pf,75mcg/0.3ml 07/01/2020    Tdap 06/20/2017        ROS     There are no discontinued medications.  Outpatient Medications Prior to Visit   Medication Sig Dispense Refill    amLODIPine (Norvasc) 5 mg tablet Take 1 tablet (5 mg) by mouth once daily. 30 tablet 5    atorvastatin (Lipitor) 40 mg tablet TAKE 1 TABLET  (40 MG) BY MOUTH ONCE DAILY. TO LOWER CHOLESTEROL 90 tablet 3    EPINEPHrine (Epipen) 0.3 mg/0.3 mL auto-injector Inject entire contents of epinephrine auto-injector into thigh. Call 911 after use. 2 each 0    ibuprofen 600 mg tablet Take 1 tablet (600 mg) by mouth every 8 (eight) hours if needed (pain). Take with food.  Discontinue if stomach upset. 60 tablet 1    metoprolol succinate XL (Toprol-XL) 50 mg 24 hr tablet TAKE 1 TABLET (50 MG) BY MOUTH EVERY DAY DO NOT CRUSH OR CHEW 90 tablet 3    omeprazole (PriLOSEC) 20 mg DR capsule Take 20 mg by mouth before breakfast. Do not crush or chew.      semaglutide, weight loss, (Wegovy) 0.25 mg/0.5 mL pen injector Inject 0.5 mL (0.25 mg) under the skin every 7 (seven) days. 2 mL 6    triamterene-hydrochlorothiazid (Maxzide-25mg ) 37.5-25 mg tablet Take 1 tablet by mouth once daily. 30 tablet 11       Objective   Visit Vitals  BP (!) 144/78 (BP Location: Right arm, Patient Position: Sitting, BP Cuff Size: Large adult)   Pulse 72   Temp 36.9 C (98.4 F) (Temporal)   Ht 1.778 m   Wt 117.5 kg   SpO2 97%   BMI 37.16 kg/m   BSA 2.41 m  Physical Exam  Awake and alert, VSS, NAD.   Neck: Supple, no tenderness or spasm,  no adenopathy, no thyromegaly, no masses, no carotid bruits, trachea is midline and freely movable.Lungs: Chest moving equally on both sides.  No retraction or stridor. No increase in AP diameter of the chest.  No wheezing, rhonchi, rales, or crackles.  Breath sounds are well heard. Heart: RRR without murmur, normal S1S2, negative rubs, clicks, gallops, negative S3 or S4, PMI is in the L 5th ICS & MCL    Assessment/Plan   Timothy Morris was seen today for follow-up.  Class 1 obesity due to excess calories with serious comorbidity and body mass index (BMI) of 34.0 to 34.9 in adult  -     semaglutide, weight loss, (Wegovy) 0.5 mg/0.5 mL pen injector; Inject 0.5 mL (0.5 mg) under the skin every 7 (seven) days.  Primary hypertension   -     semaglutide, weight loss,  (Wegovy) 0.5 mg/0.5 mL pen injector; Inject 0.5 mL (0.5 mg) under the skin every 7 (seven) days.  Fatty liver  -     semaglutide, weight loss, (Wegovy) 0.5 mg/0.5 mL pen injector; Inject 0.5 mL (0.5 mg) under the skin every 7 (seven) days.    Obesity with comorbid hypertension and fatty liver, just 1 month on the low-dose [facility] and doing well  Discussed and changed to [facility] 0.5, recheck in 1 month, call if any problems, encouraged hydration, high-protein, low-fat diet and using OTC medications if needed for the nausea    Patient Health Questionnaire-2 Score: 0   Interpretation: Negative screening.     Follow-up & Interventions: Maintain annual screening - No additional Follow-up required          Ivana Maris, MD         [1]   Patient Active Problem List  Diagnosis    Primary hypertension     Hyperlipidemia     Fatty liver    Degenerative disc disease, lumbar    Class 1 obesity due to excess calories with serious comorbidity and body mass index (BMI) of 34.0 to 34.9 in adult    Lumbar radiculopathy    Lumbosacral spondylosis without myelopathy    Cervical radiculopathy    History of gout    Abnormal MRI, lumbar spine   [2]   Allergies  Allergen Reactions    Ace Inhibitors Angioedema   [3]   Past Medical History:  Diagnosis Date    Fatty liver     History of gout     Primary hypertension    [4]   Past Surgical History:  Procedure Laterality Date    CARPAL TUNNEL RELEASE Left 2025    endoscopic    ESOPHAGOGASTRODUODENOSCOPY  2023    INGUINAL HERNIA REPAIR      KNEE ARTHROSCOPY W/ DEBRIDEMENT Right     x2    LEFT COLECTOMY      Sigmoid    ROTATOR CUFF REPAIR Right    [5]   Family History  Problem Relation Name Age of Onset    Breast cancer Mother      No Known Problems Sister     [6]   Social History  Tobacco Use    Smoking status: Never    Smokeless tobacco: Never   Vaping Use    Vaping status: Never Used   Substance Use Topics    Alcohol use: Never    Drug use: Yes     Types: Marijuana

## 2023-11-27 ENCOUNTER — Ambulatory Visit
Admit: 2023-11-27 | Discharge: 2023-11-27 | Payer: PRIVATE HEALTH INSURANCE | Attending: Legal Medicine | Primary: Family

## 2023-11-27 DIAGNOSIS — E6609 Other obesity due to excess calories: Secondary | ICD-10-CM

## 2023-11-27 MED ORDER — semaglutide, weight loss, (Wegovy) 1 mg/0.5 mL pen injector
1 | SUBCUTANEOUS | 3 refills | 28.00000 days | Status: DC
Start: 2023-11-27 — End: 2024-02-01

## 2023-11-27 NOTE — Progress Notes (Signed)
 [facility] FAMILY MEDICINE [address] MAIN  [facility] FAMILY MEDICINE [address] MAIN  [address]  [address]  [place] [address]  Dept: [phone]  Dept Fax: [phone]     Patient ID: Timothy Morris is a 53 y.o. male who presents for Follow-up.    Subjective   HPI  Patient is here today for his checkup and medication follow-up, he has been on the [facility] 0.5 now for the last 4 weeks and doing well, mild fatigue and constipation still but no other side effects, he has dropped some weight although he said he has plateaued in the last week, he also mentioned a different concern, some throat irritation discomfort going on for several months, not getting worse but not getting better, he was never a smoker other than marijuana when he was younger    Problem List[1]  Current Outpatient Medications   Medication Instructions    amLODIPine (NORVASC) 5 mg, oral, Daily    atorvastatin (LIPITOR) 40 mg, oral, Daily, To lower cholesterol    EPINEPHrine (EPIPEN) 0.3 mg, intramuscular, Once as needed, Call 911 after use.    metoprolol succinate XL (TOPROL-XL) 50 mg, oral, Daily, Do not crush or chew    omeprazole (PRILOSEC) 20 mg, Daily before breakfast    triamterene-hydrochlorothiazid (Maxzide-25mg ) 37.5-25 mg tablet 1 tablet, oral, Daily    Wegovy 1 mg, subcutaneous, Every 7 days     Allergies[2]  Medical History[3]  Surgical History[4]  Family History[5]  Social History[6]  Immunization History   Administered Date(s) Administered    COVID-19 Pfizer Monovalent 09/19/2019, 10/29/2019    Covid-19,mrna,lnp-s,pf,36mcg/0.3ml 07/01/2020    Tdap 06/20/2017        ROS     Medications Discontinued During This Encounter   Medication Reason    ibuprofen 600 mg tablet     semaglutide, weight loss, (Wegovy) 0.5 mg/0.5 mL pen injector     semaglutide, weight loss, (Wegovy) 0.25 mg/0.5 mL pen injector      Outpatient Medications Prior to Visit   Medication Sig Dispense Refill    amLODIPine (Norvasc) 5 mg tablet Take 1 tablet (5 mg) by mouth once daily. 30  tablet 5    atorvastatin (Lipitor) 40 mg tablet TAKE 1 TABLET (40 MG) BY MOUTH ONCE DAILY. TO LOWER CHOLESTEROL 90 tablet 3    EPINEPHrine (Epipen) 0.3 mg/0.3 mL auto-injector Inject entire contents of epinephrine auto-injector into thigh. Call 911 after use. 2 each 0    metoprolol succinate XL (Toprol-XL) 50 mg 24 hr tablet TAKE 1 TABLET (50 MG) BY MOUTH EVERY DAY DO NOT CRUSH OR CHEW 90 tablet 3    omeprazole (PriLOSEC) 20 mg DR capsule Take 20 mg by mouth before breakfast. Do not crush or chew.      triamterene-hydrochlorothiazid (Maxzide-25mg ) 37.5-25 mg tablet Take 1 tablet by mouth once daily. 30 tablet 11    semaglutide, weight loss, (Wegovy) 0.25 mg/0.5 mL pen injector Inject 0.5 mL (0.25 mg) under the skin every 7 (seven) days. 2 mL 6    semaglutide, weight loss, (Wegovy) 0.5 mg/0.5 mL pen injector Inject 0.5 mL (0.5 mg) under the skin every 7 (seven) days. 2 mL 3    ibuprofen 600 mg tablet Take 1 tablet (600 mg) by mouth every 8 (eight) hours if needed (pain). Take with food.  Discontinue if stomach upset. 60 tablet 1       Objective   Visit Vitals  BP 128/77 (BP Location: Right arm, Patient Position: Sitting, BP Cuff Size: Large adult)  Pulse 60   Temp 36.8 C (98.2 F) (Temporal)   Ht 1.778 m   Wt 113.9 kg   SpO2 95%   BMI 36.01 kg/m   BSA 2.37 m       Physical Exam  Awake and alert, VSS, NAD. NCAT, PERRLA, EOMI, sclera non-icteric, conjunctiva pink without swelling or drainage,  visual accuity grossly normal. Throat with mild erythema and drainage, soft palate midline and raises symmetrically, dental in good repair.Neck: Supple, no tenderness or spasm,  no adenopathy, no thyromegaly, no masses, no carotid bruits, trachea is midline and freely movable.Lungs: Chest moving equally on both sides.  No retraction or stridor. No increase in AP diameter of the chest.  No wheezing, rhonchi, rales, or crackles.  Breath sounds are well heard. Heart: RRR without murmur, normal S1S2, negative rubs, clicks,  gallops, negative S3 or S4, PMI is in the L 5th ICS & MCL.    Assessment/Plan   Timothy Morris was seen today for follow-up.  Class 1 obesity due to excess calories with serious comorbidity and body mass index (BMI) of 34.0 to 34.9 in adult  -     semaglutide, weight loss, (Wegovy) 1 mg/0.5 mL pen injector; Inject 0.5 mL (1 mg) under the skin every 7 (seven) days.  Throat discomfort  -     [facility]  [place]  [facility]; Future    1.  Obesity-good response still to [facility] at the 0.5 dose although with some plateauing in the last week  Discussed and changed him to [facility] 1 mg weekly, recheck once more for me in about a month, call if any problems  2.  Throat discomfort, chronic for months  Discussed and given an ENT referral for a consultation    Patient Health Questionnaire-2 Score: 0   Interpretation: Negative screening.     Follow-up & Interventions:   - Maintain annual screening - No additional Follow-up required.          Reyes Ngo, MD         [1]   Patient Active Problem List  Diagnosis    Primary hypertension     Hyperlipidemia     Fatty liver    Degenerative disc disease, lumbar    Class 1 obesity due to excess calories with serious comorbidity and body mass index (BMI) of 34.0 to 34.9 in adult    Lumbar radiculopathy    Lumbosacral spondylosis without myelopathy    Cervical radiculopathy    History of gout    Abnormal MRI, lumbar spine   [2]   Allergies  Allergen Reactions    Ace Inhibitors Angioedema   [3]   Past Medical History:  Diagnosis Date    Fatty liver     History of gout     Primary hypertension     [4]   Past Surgical History:  Procedure Laterality Date    CARPAL TUNNEL RELEASE Left 2025    endoscopic    ESOPHAGOGASTRODUODENOSCOPY  2023    INGUINAL HERNIA REPAIR      KNEE ARTHROSCOPY W/ DEBRIDEMENT Right     x2    LEFT COLECTOMY      Sigmoid    ROTATOR CUFF REPAIR Right    [5]   Family History  Problem Relation Name Age of Onset    Breast cancer Mother      No Known Problems Sister      [6]   Social History  Tobacco Use    Smoking status: Never  Smokeless tobacco: Never   Vaping Use    Vaping status: Never Used   Substance Use Topics    Alcohol use: Never    Drug use: Yes     Types: Marijuana

## 2023-11-29 NOTE — Telephone Encounter (Signed)
 Appointment:        Date of patient's next encounter in the current department:  12/21/2023   Date of patient's next encounter with the current provider:  12/21/2023   Date of patient's last encounter in the current department: 11/27/2023      Last BP:   BP Readings from Last 3 Encounters:   11/27/23 128/77   10/26/23 (!) 144/78   07/11/23 (!) 143/83       Last HGBA1C:   HgbA1C (%)   Date Value   12/21/2022 5.5       Labs  TSH   Date Value Ref Range Status   12/21/2022 1.160 0.450 - 4.500 uIU/mL Final     Comment:     No apparent thyroid disorder. Additional testing not indicated. In  rare instances, Secondary Hypothyroidism as well as Subclinical  Hypothyroidism have been reported in some patients with normal TSH  values.       Lab Results   Component Value Date    CALCIUM 9.8 12/21/2022    ALBUMIN 4.2 11/26/2021    NA 138 12/21/2022    K 4.4 12/21/2022    CO2 22 12/21/2022    CL 103 12/21/2022    BUN 15 12/21/2022    CREATININE 1.06 12/21/2022      AST   Date Value Ref Range Status   11/01/2021 52 (H) 6 - 42 U/L Final   11/16/2020 35 6 - 42 U/L Final     AST (SGOT)   Date Value Ref Range Status   11/26/2021 53 (H) 15 - 37 IU/L Final     ALT   Date Value Ref Range Status   11/01/2021 104 (H) 0 - 55 U/L Final   11/16/2020 64 (H) 0 - 55 U/L Final     ALT (SGPT)   Date Value Ref Range Status   11/26/2021 99 (H) 0 - 55 IU/L Final     Comment:     Lab Director: CURTISTINE ARVIN     No components found for: TBILI  Hemoglobin   Date Value Ref Range Status   11/01/2021 15.1 13.0 - 17.5 g/dL Final     Hgb   Date Value Ref Range Status   12/21/2022 14.8 13.0 - 17.7 g/dL Final     Hematocrit   Date Value Ref Range Status   11/01/2021 42.6 37.0 - 53.0 % Final     Hct   Date Value Ref Range Status   12/21/2022 44.0 37.5 - 51.0 % Final     Platelets   Date Value Ref Range Status   12/21/2022 157 150 - 450 x10E3/uL Final   11/01/2021 241 150 - 400 K/uL Final     No results found for: PTT  No results found for: INR  No results  found for: PROTIME  No results found for: PTADJUSTED    Last Urine Drug Screen: on .  Narcotic Medications:    Directives/Controlled Substance Agreement   PMP Appropriate __YES      ___No

## 2023-12-21 ENCOUNTER — Ambulatory Visit: Admit: 2023-12-21 | Discharge: 2023-12-21 | Payer: PRIVATE HEALTH INSURANCE | Attending: Family | Primary: Family

## 2023-12-21 ENCOUNTER — Encounter: Payer: PRIVATE HEALTH INSURANCE | Attending: Legal Medicine | Primary: Family

## 2023-12-21 DIAGNOSIS — K219 Gastro-esophageal reflux disease without esophagitis: Principal | ICD-10-CM

## 2023-12-21 MED ORDER — omeprazole (PriLOSEC) 20 mg DR capsule
20 | Freq: Every day | ORAL | 0 refills | 60.00000 days | Status: DC
Start: 2023-12-21 — End: 2024-02-14

## 2023-12-21 NOTE — Progress Notes (Signed)
 [facility] FAMILY MEDICINE [address] MAIN  [address]  [address]  [place] [address]  Dept: [phone]  Dept Fax: [phone]     Patient ID: Timothy Morris is a 53 y.o. male who presents for Follow-up (1 month).  Patient is today for follow-up on Wegovy .  Currently taking 1 mg weekly.  Patient states does have some constipation with use of medication.  Of note patient did follow-up with ear nose throat doctor for evaluation and found to have some acid reflux.  Patient is scheduled to have barium swallow and also has referral for gastroenterologist.  Patient denies any difficulty swallowing or nausea or vomiting.  Is taken over-the-counter omeprazole  20 mg daily.  Today patient denies any chest pain, shortness of breath, dizziness.  Pre-visit  discussed with other staff members to plan and provide appropriate testing  and management for patient.     Subjective  HPI  Current Outpatient Medications   Medication Instructions    amLODIPine  (NORVASC ) 5 mg, oral, Daily    atorvastatin  (LIPITOR) 40 mg, oral, Daily, To lower cholesterol    EPINEPHrine  (EPIPEN ) 0.3 mg, intramuscular, Once as needed, Call 911 after use.    metoprolol  succinate XL (TOPROL -XL) 50 mg, oral, Daily, Do not crush or chew    omeprazole  (PRILOSEC) 20 mg, oral, Daily before breakfast, Do not crush or chew.    triamterene -hydrochlorothiazid (Maxzide -25mg ) 37.5-25 mg tablet 1 tablet, oral, Daily    Wegovy  1 mg, subcutaneous, Every 7 days     Allergies[1]  Medical History[2]    Objective  Visit Vitals  BP 136/86 (BP Location: Left arm, Patient Position: Sitting, BP Cuff Size: Large adult)   Pulse 74   Ht 1.778 m   Wt 111.6 kg   SpO2 97%   BMI 35.30 kg/m   BSA 2.35 m     Physical Exam  Physical Exam  General:  Alert and oriented in no Acute distress.  Cardiovascular:      Rate and Rhythm: Normal rate and regular rhythm.   Pulmonary:      Effort: Pulmonary effort is normal.      Breath sounds: No wheezing.   Abdominal:      General: Bowel sounds are  normal.      Palpations: Abdomen is soft.   Skin:     General: Skin is warm and dry.      Assessment / Plan  Abdelaziz was seen today for follow-up.  Gastroesophageal reflux disease without esophagitis  -     omeprazole  (PriLOSEC) 20 mg DR capsule; Take 1 capsule (20 mg) by mouth before breakfast. Do not crush or chew.  Primary hypertension   Assessment & Plan:  Blood pressure stable.  Will continue with current medications as prescribed.Bp goal < 140/90  blood pressure will be monitored and maintained at optimal levels per JNC 8 Guidelines.   Meds reviewed for appropriateness.  Discussed with patient lifestyle modification, diet, exercise 150 minutes per week and to continue with current medications as prescribed.   Dicussed with patient to take bp at home and record.  Monitor salt intake    Class 2 obesity due to excess calories without serious comorbidity with body mass index (BMI) of 35.0 to 35.9 in adult  Assessment & Plan:  Continue with current medications.  Continue to follow good diet.  Exercise  Discussed pneumonia vaccination and shingles vaccination with patient.  Defers to get at this visit.  Will schedule other to obtain    Patient Health  Questionnaire-2 Score: 0   Interpretation: Negative screening.     Follow-up & Interventions:   - Maintain annual screening - No additional Follow-up required.             [1]   Allergies  Allergen Reactions    Ace Inhibitors Angioedema   [2]   Past Medical History:  Diagnosis Date    Fatty liver     History of gout     Primary hypertension

## 2023-12-21 NOTE — Patient Instructions (Signed)
 Continue with medication at this time.  Follow-up with gastroenterologist.  Recommend pneumonia vaccination and shingles vaccination.  Avoid foods that cause heartburn.  Continue with exercise 150 minutes/week.

## 2023-12-21 NOTE — Assessment & Plan Note (Signed)
 Continue with current medications.  Continue to follow good diet.  Exercise

## 2023-12-21 NOTE — Assessment & Plan Note (Signed)
 Blood pressure stable.  Will continue with current medications as prescribed.  Bp goal < 140/90  blood pressure will be monitored and maintained at optimal levels per JNC 8 Guidelines.   Meds reviewed for appropriateness.  Discussed with patient lifestyle modification, diet, exercise 150 minutes per week and to continue with current medications as prescribed.   Dicussed with patient to take bp at home and record.  Monitor salt intake

## 2023-12-26 ENCOUNTER — Inpatient Hospital Stay: Admit: 2023-12-26 | Payer: PRIVATE HEALTH INSURANCE | Primary: Family

## 2023-12-26 DIAGNOSIS — R1310 Dysphagia, unspecified: Principal | ICD-10-CM

## 2023-12-26 MED ORDER — barium sulfate (Barosperse) 60 % (w/v) suspension 34.8 g
60 | Freq: Once | ORAL | Status: DC
Start: 2023-12-26 — End: 2023-12-27

## 2023-12-26 MED ORDER — sod bicarb-citric ac-simeth granules 4 g
2.21-1.53 | Freq: Once | ORAL | Status: DC
Start: 2023-12-26 — End: 2023-12-27

## 2024-01-25 NOTE — Telephone Encounter (Signed)
 rescheduled appt for 8/21 because he wanted a sooner appt due to question on meds

## 2024-02-01 ENCOUNTER — Ambulatory Visit: Admit: 2024-02-01 | Discharge: 2024-02-01 | Payer: PRIVATE HEALTH INSURANCE | Attending: Family | Primary: Family

## 2024-02-01 DIAGNOSIS — Z0000 Encounter for general adult medical examination without abnormal findings: Principal | ICD-10-CM

## 2024-02-01 MED ORDER — semaglutide, weight loss, (Wegovy) 1.7 mg/0.75 mL pen injector
1.7 | SUBCUTANEOUS | 3 refills | 28.00000 days | Status: AC
Start: 2024-02-01 — End: 2024-04-11

## 2024-02-01 NOTE — Assessment & Plan Note (Signed)
 Will increase Wegovy  to 1.7 mg weekly.  Encourage patient to continue with exercise.  Increase protein intake.

## 2024-02-01 NOTE — Assessment & Plan Note (Addendum)
 Discussed varicosities and etiology.  Discussed vascular referral as needed.  Patient defers at this time.  Denies any pain or discomfort

## 2024-02-01 NOTE — Progress Notes (Signed)
 [facility] FAMILY MEDICINE [address] MAIN  [address]  [address]  [place] [address]  Dept: [phone]  Dept Fax: [phone]     Patient ID: Timothy Morris is a 53 y.o. male who presents for Annual Exam.  Patient in today for annual exam.    General: Denies any Fever,chills    Cardiovascular: Denies any chest pains, palpitations or shortness of breath.  Pulmonary: Denies  any cough or wheezing.  Gastro: Denies any acid indigestion, difficulty/pain with swallowing. No reports of nausea, vomiting or diarrhea, constipation.  Denies any abdominal pain.   Urological: Denies any dysuria, urinary frequency or other urinary symptoms.  Musculoskeletal: Denies any muscle cramps or aches. Denies any joint pain.   Skin: Denies any rashes or skin lesions.   Neurological: Denies any memory loss, dizziness, headaches or visual disturbances.  Psychiatric: Denies any anxiety, depression. No suicidal or homicidal ideations. No thoughts of violence.  Endocrine: Denies any hair loss. Denies loss or gain of weight.  Currently using Wegovy  1 mg daily, would like to increase dose  Dental: Exam up to date  Eye: Eye exam up to date goes yearly wears contacts  Exercise: Walking 5 times weekly.  1.  Diagnosis of elevated blood pressure.  Currently taking amlodipine  5 mg daily, metoprolol  50 mg daily and triamterene  hydrochlorothiazide  37.5-25 mg  #2.  Of note patient does have diagnosis of hyperlipidemia, states stopped taking atorvastatin  2 weeks ago.  Denies having any side effects just decided to stop.  3.  Patient would like to have testosterone  level checked.  States does have increased fatigue  Pre-visit  discussed with other staff members to plan and provide appropriate testing  and management for patient.     Subjective  HPI  Current Outpatient Medications   Medication Instructions    amLODIPine  (NORVASC ) 5 mg, oral, Daily    atorvastatin  (LIPITOR) 40 mg, oral, Daily, To lower cholesterol    EPINEPHrine  (EPIPEN ) 0.3 mg, intramuscular,  Once as needed, Call 911 after use.    metoprolol  succinate XL (TOPROL -XL) 50 mg, oral, Daily, Do not crush or chew    omeprazole  (PRILOSEC) 20 mg, oral, Daily before breakfast, Do not crush or chew.    triamterene -hydrochlorothiazid (Maxzide -25mg ) 37.5-25 mg tablet 1 tablet, oral, Daily    Wegovy  1.7 mg, subcutaneous, Every 7 days     Allergies[1]  Medical History[2]    Objective  Visit Vitals  BP 138/86   Pulse 67   Temp 36.3 C (97.3 F) (Temporal)   Ht 1.778 m   Wt 105.2 kg   SpO2 98%   BMI 33.29 kg/m   BSA 2.28 m     Physical Exam  General:  53 year old obese male in no acute distress  Head:      normocephalic and atraumatic.    Eyes:      PERRL/EOM intact, conjunctiva and sclera clear with out nystagmus.    Ears:      TM's intact and clear with normal canals with grossly normal hearing.    Nose:      no deformity, discharge, inflammation, or lesions.    Mouth:      no deformity or lesions with good dentition.    Neck:      no masses, thyromegaly, or abnormal cervical nodes.    Chest Wall:      no deformities or breast masses noted.    Lungs:      clear bilaterally to auscultation.    Heart:  non-displaced PMI, chest non-tender; regular rate and rhythm, S1, S2 without murmurs, rubs, or gallops  Abdomen:       normal bowel sounds; no hepatosplenomegaly no ventral,umbilical hernias or masses noted.    Rectal:      deferred  Genitalia:      normal male, testes descended bilaterally without masses, no hernias, no varicoceles noted.    Msk:      no deformity or scoliosis noted of thoracic or lumbar spine.    Pulses:      pulses normal in all 4 extremities.    Extremities:      no clubbing, cyanosis, edema, or deformity noted with normal full range of motion of all joints.    Varicosities right lower leg  Neurologic:      no focal deficits, cranial nerves II-XII grossly intact with normal sensation, reflexes, coordination, muscle strength and tone.    Skin:     Several brown raised lesions on back question  seborrheic keratosis.  Multiple tattoos  Cervical Nodes:      no significant adenopathy.    Axillary Nodes:      no significant adenopathy.    Inguinal Nodes:      no significant adenopathy.    Psych:      alert and cooperative; normal mood and affect; normal attention span and concentration.     Assessment / Plan  Timothy Morris was seen today for annual exam.  Preventative health care  -     ALT  -     AST  -     Basic metabolic panel  -     CBC  -     Lipid panel  -     PSA Total, Screen  Class 1 obesity due to excess calories without serious comorbidity with body mass index (BMI) of 33.0 to 33.9 in adult  Assessment & Plan:  Will increase Wegovy  to 1.7 mg weekly.  Encourage patient to continue with exercise.  Increase protein intake.      Orders:  -     semaglutide , weight loss, (Wegovy ) 1.7 mg/0.75 mL pen injector; Inject 0.75 mL (1.7 mg) under the skin every 7 (seven) days.  Fatigue, unspecified type  -     Testosterone  Free and Total; Future  Primary hypertension   Assessment & Plan:  Blood pressure stable.  Bp goal < 140/90  blood pressure will be monitored and maintained at optimal levels per JNC 8 Guidelines.   Meds reviewed for appropriateness.  Discussed with patient lifestyle modification, diet, exercise 150 minutes per week and to continue with current medications as prescribed.   Dicussed with patient to take bp at home and record.  Monitor salt intake    Asymptomatic varicose veins of right lower extremity  Assessment & Plan:  Discussed varicosities and etiology.  Discussed vascular referral as needed.  Patient defers at this time.  Denies any pain or discomfort    Mixed hyperlipidemia   Assessment & Plan:  Urged patient to resume cholesterol medication.  Will draw labs levels today  Skin cancer screening  -     EXT  Dermatology Referral; Future   Patient histories, medications,diagnoses, providers, as well as functionl status, mental status, HRA, educational needs and barriers reviewed.  Follow up in one  year for annual wellness exam.   Patient will follow up for acute, ongoing and chronic issues as scheduled and as needed.  Routine screening was reviewed per guidelines by USPSTF  Patient Health Questionnaire-2 Score: 0   Interpretation: Negative screening.     Follow-up & Interventions:   - Maintain annual screening - No additional Follow-up required.             [1]   Allergies  Allergen Reactions    Ace Inhibitors Angioedema   [2]   Past Medical History:  Diagnosis Date    Fatty liver     History of gout     Primary hypertension

## 2024-02-01 NOTE — Assessment & Plan Note (Signed)
 Blood pressure stable  Bp goal < 140/90  blood pressure will be monitored and maintained at optimal levels per JNC 8 Guidelines.   Meds reviewed for appropriateness.  Discussed with patient lifestyle modification, diet, exercise 150 minutes per week and to continue with current medications as prescribed.   Dicussed with patient to take bp at home and record.  Monitor salt intake

## 2024-02-01 NOTE — Patient Instructions (Addendum)
 Continue medications as prescribed  Call if any problems  Avoidance of salt  Mediterranean diet recommended  Exercise 150 minutes per week  Lose weight for better management of blood pressure.  Recommend eye exam Yearly or per insurance allowance.  Monitor blood pressure at home with proper size cuff, sit down with feet on floor, arm at heart level.   Optimal blood pressure should be <140/90  Follow up in 6 months.   Dental exams q 6 months.  Wear Sunscreen.  BMI should be < 25  Call or come in office if any concerns.   Please be sure to register and correspond with Charlena Genre NP-C  through the [facility] Patient Portal.     Follow-up with dermatologist  Recommend pneumonia vaccination and shingles vaccination may get at local pharmacy

## 2024-02-01 NOTE — Assessment & Plan Note (Signed)
 Urged patient to resume cholesterol medication.  Will draw labs levels today

## 2024-02-02 LAB — CBC
Hct: 42.6 % (ref 37.5–51.0)
Hgb: 14.4 g/dL (ref 13.0–17.7)
MCH: 32.3 pg (ref 26.6–33.0)
MCHC: 33.8 g/dL (ref 31.5–35.7)
MCV: 96 fL (ref 79–97)
Platelets: 153 x10E3/uL (ref 150–450)
RBC: 4.46 x10E6/uL (ref 4.14–5.80)
RDW: 14.3 % (ref 11.6–15.4)
WBC: 4.9 x10E3/uL (ref 3.4–10.8)

## 2024-02-02 LAB — BASIC METABOLIC PANEL
Anion Gap: 15 mmol/L (ref 10.0–18.0)
BUN/Creat Ratio: 19 (ref 9–20)
BUN: 23 mg/dL (ref 6–24)
Calcium: 9.3 mg/dL (ref 8.7–10.2)
Carbon Dioxide: 21 mmol/L (ref 20–29)
Chloride: 101 mmol/L (ref 96–106)
Creat: 1.19 mg/dL (ref 0.76–1.27)
Glucose: 125 mg/dL — ABNORMAL HIGH (ref 70–99)
Potassium: 4 mmol/L (ref 3.5–5.2)
Sodium: 137 mmol/L (ref 134–144)
eGFR: 72 mL/min/1.73 (ref >59)

## 2024-02-02 LAB — PSA TOTAL, SCREEN: PSA TOTAL: 0.4 ng/mL (ref 0.0–4.0)

## 2024-02-02 LAB — ALT: ALT: 53 IU/L — ABNORMAL HIGH (ref 0–44)

## 2024-02-02 LAB — LIPID PANEL
Cholesterol: 175 mg/dL (ref 100–199)
HDL Cholesterol: 25 mg/dL — ABNORMAL LOW (ref >39)
LDLc Calc (NIH): 87 mg/dL (ref 0–99)
Non-HDL Chol: 150 mg/dL — ABNORMAL HIGH (ref 0–129)
Triglycerides: 380 mg/dL — ABNORMAL HIGH (ref 0–149)
VLDLc Calc: 63 mg/dL — ABNORMAL HIGH (ref 5–40)

## 2024-02-02 LAB — AST: AST: 40 IU/L (ref 0–40)

## 2024-02-06 ENCOUNTER — Ambulatory Visit
Payer: PRIVATE HEALTH INSURANCE | Attending: Student in an Organized Health Care Education/Training Program | Primary: Family

## 2024-02-08 ENCOUNTER — Encounter: Payer: PRIVATE HEALTH INSURANCE | Attending: Family | Primary: Family

## 2024-02-08 LAB — TESTOSTERONE, FREE AND TOTAL, MS
% Free Testost: 2.17 % (ref 1.50–4.20)
Testost Free: 9.25 ng/dL (ref 5.00–21.00)
Testosterone LCMS: 426.1 ng/dL (ref 264.0–916.0)

## 2024-02-08 NOTE — Telephone Encounter (Signed)
 Patient notified

## 2024-02-14 NOTE — Telephone Encounter (Signed)
 Appointment:        Date of patient's next encounter in the current department:  Visit date not found   Date of patient's next encounter with the current provider:  Visit date not found   Date of patient's last encounter in the current department: 02/01/2024      Last BP:   BP Readings from Last 3 Encounters:   02/01/24 138/86   12/21/23 136/86   11/27/23 128/77       Last HGBA1C:   HgbA1C (%)   Date Value   12/21/2022 5.5       Labs  TSH   Date Value Ref Range Status   12/21/2022 1.160 0.450 - 4.500 uIU/mL Final     Comment:     No apparent thyroid disorder. Additional testing not indicated. In  rare instances, Secondary Hypothyroidism as well as Subclinical  Hypothyroidism have been reported in some patients with normal TSH  values.       Lab Results   Component Value Date    CALCIUM 9.3 02/01/2024    ALBUMIN 4.2 11/26/2021    NA 137 02/01/2024    K 4.0 02/01/2024    CO2 21 02/01/2024    CL 101 02/01/2024    BUN 23 02/01/2024    CREATININE 1.19 02/01/2024      AST   Date Value Ref Range Status   02/01/2024 40 0 - 40 IU/L Final   11/01/2021 52 (H) 6 - 42 U/L Final   11/16/2020 35 6 - 42 U/L Final     AST (SGOT)   Date Value Ref Range Status   11/26/2021 53 (H) 15 - 37 IU/L Final     ALT   Date Value Ref Range Status   02/01/2024 53 (H) 0 - 44 IU/L Final   11/01/2021 104 (H) 0 - 55 U/L Final   11/16/2020 64 (H) 0 - 55 U/L Final     ALT (SGPT)   Date Value Ref Range Status   11/26/2021 99 (H) 0 - 55 IU/L Final     Comment:     Lab Director: CURTISTINE ARVIN     No components found for: TBILI  Hemoglobin   Date Value Ref Range Status   11/01/2021 15.1 13.0 - 17.5 g/dL Final     Hgb   Date Value Ref Range Status   02/01/2024 14.4 13.0 - 17.7 g/dL Final     Hematocrit   Date Value Ref Range Status   11/01/2021 42.6 37.0 - 53.0 % Final     Hct   Date Value Ref Range Status   02/01/2024 42.6 37.5 - 51.0 % Final     Platelets   Date Value Ref Range Status   02/01/2024 153 150 - 450 x10E3/uL Final   11/01/2021 241 150 -  400 K/uL Final     No results found for: PTT  No results found for: INR  No results found for: PROTIME  No results found for: PTADJUSTED    Last Urine Drug Screen: on .  Narcotic Medications:    Directives/Controlled Substance Agreement   PMP Appropriate __YES      ___No

## 2024-03-14 MED ORDER — omeprazole (PriLOSEC) 20 mg DR capsule
20 | ORAL_CAPSULE | Freq: Every day | ORAL | 0 refills | 60.00000 days | Status: AC
Start: 2024-03-14 — End: ?

## 2024-03-14 NOTE — Telephone Encounter (Signed)
 Appointment:        Date of patient's next encounter in the current department:  Visit date not found   Date of patient's next encounter with the current provider:  Visit date not found   Date of patient's last encounter in the current department: 02/01/2024      Last BP:   BP Readings from Last 3 Encounters:   02/01/24 138/86   12/21/23 136/86   11/27/23 128/77       Last HGBA1C:   HgbA1C (%)   Date Value   12/21/2022 5.5       Labs  TSH   Date Value Ref Range Status   12/21/2022 1.160 0.450 - 4.500 uIU/mL Final     Comment:     No apparent thyroid disorder. Additional testing not indicated. In  rare instances, Secondary Hypothyroidism as well as Subclinical  Hypothyroidism have been reported in some patients with normal TSH  values.       Lab Results   Component Value Date    CALCIUM 9.3 02/01/2024    ALBUMIN 4.2 11/26/2021    NA 137 02/01/2024    K 4.0 02/01/2024    CO2 21 02/01/2024    CL 101 02/01/2024    BUN 23 02/01/2024    CREATININE 1.19 02/01/2024      AST   Date Value Ref Range Status   02/01/2024 40 0 - 40 IU/L Final   11/01/2021 52 (H) 6 - 42 U/L Final   11/16/2020 35 6 - 42 U/L Final     AST (SGOT)   Date Value Ref Range Status   11/26/2021 53 (H) 15 - 37 IU/L Final     ALT   Date Value Ref Range Status   02/01/2024 53 (H) 0 - 44 IU/L Final   11/01/2021 104 (H) 0 - 55 U/L Final   11/16/2020 64 (H) 0 - 55 U/L Final     ALT (SGPT)   Date Value Ref Range Status   11/26/2021 99 (H) 0 - 55 IU/L Final     Comment:     Lab Director: CURTISTINE ARVIN     No components found for: TBILI  Hemoglobin   Date Value Ref Range Status   11/01/2021 15.1 13.0 - 17.5 g/dL Final     Hgb   Date Value Ref Range Status   02/01/2024 14.4 13.0 - 17.7 g/dL Final     Hematocrit   Date Value Ref Range Status   11/01/2021 42.6 37.0 - 53.0 % Final     Hct   Date Value Ref Range Status   02/01/2024 42.6 37.5 - 51.0 % Final     Platelets   Date Value Ref Range Status   02/01/2024 153 150 - 450 x10E3/uL Final   11/01/2021 241 150 -  400 K/uL Final     No results found for: PTT  No results found for: INR  No results found for: PROTIME  No results found for: PTADJUSTED    Last Urine Drug Screen: on .  Narcotic Medications:    Directives/Controlled Substance Agreement   PMP Appropriate __YES      ___No

## 2024-03-25 ENCOUNTER — Encounter: Payer: PRIVATE HEALTH INSURANCE | Attending: Family | Primary: Family

## 2024-03-25 MED ORDER — amLODIPine (Norvasc) 5 mg tablet
5 | ORAL_TABLET | Freq: Every day | ORAL | 1 refills | 30.00000 days | Status: AC
Start: 2024-03-25 — End: ?

## 2024-03-25 NOTE — Telephone Encounter (Signed)
 Appointment:        Date of patient's next encounter in the current department:  Visit date not found   Date of patient's next encounter with the current provider:  Visit date not found   Date of patient's last encounter in the current department: 02/01/2024      Last BP:   BP Readings from Last 3 Encounters:   02/01/24 138/86   12/21/23 136/86   11/27/23 128/77       Last HGBA1C:   HgbA1C (%)   Date Value   12/21/2022 5.5       Labs  TSH   Date Value Ref Range Status   12/21/2022 1.160 0.450 - 4.500 uIU/mL Final     Comment:     No apparent thyroid disorder. Additional testing not indicated. In  rare instances, Secondary Hypothyroidism as well as Subclinical  Hypothyroidism have been reported in some patients with normal TSH  values.       Lab Results   Component Value Date    CALCIUM 9.3 02/01/2024    ALBUMIN 4.2 11/26/2021    NA 137 02/01/2024    K 4.0 02/01/2024    CO2 21 02/01/2024    CL 101 02/01/2024    BUN 23 02/01/2024    CREATININE 1.19 02/01/2024      AST   Date Value Ref Range Status   02/01/2024 40 0 - 40 IU/L Final   11/01/2021 52 (H) 6 - 42 U/L Final   11/16/2020 35 6 - 42 U/L Final     AST (SGOT)   Date Value Ref Range Status   11/26/2021 53 (H) 15 - 37 IU/L Final     ALT   Date Value Ref Range Status   02/01/2024 53 (H) 0 - 44 IU/L Final   11/01/2021 104 (H) 0 - 55 U/L Final   11/16/2020 64 (H) 0 - 55 U/L Final     ALT (SGPT)   Date Value Ref Range Status   11/26/2021 99 (H) 0 - 55 IU/L Final     Comment:     Lab Director: CURTISTINE ARVIN     No components found for: TBILI  Hemoglobin   Date Value Ref Range Status   11/01/2021 15.1 13.0 - 17.5 g/dL Final     Hgb   Date Value Ref Range Status   02/01/2024 14.4 13.0 - 17.7 g/dL Final     Hematocrit   Date Value Ref Range Status   11/01/2021 42.6 37.0 - 53.0 % Final     Hct   Date Value Ref Range Status   02/01/2024 42.6 37.5 - 51.0 % Final     Platelets   Date Value Ref Range Status   02/01/2024 153 150 - 450 x10E3/uL Final   11/01/2021 241 150 -  400 K/uL Final     No results found for: PTT  No results found for: INR  No results found for: PROTIME  No results found for: PTADJUSTED    Last Urine Drug Screen: on .  Narcotic Medications:    Directives/Controlled Substance Agreement   PMP Appropriate __YES      ___No

## 2024-04-11 MED ORDER — WEGOVY 2.4 MG/0.75 ML SUBCUTANEOUS PEN INJECTOR
2.4 | SUBCUTANEOUS | 2 refills | 28.00000 days | Status: DC
Start: 2024-04-11 — End: 2024-07-08

## 2024-04-11 NOTE — Telephone Encounter (Signed)
 Let know medication called in.

## 2024-04-11 NOTE — Telephone Encounter (Signed)
 Patient will need to increase dose prior to getting new PA

## 2024-04-11 NOTE — Telephone Encounter (Signed)
 Department Name: Primary Care    Patient: Timothy Morris  MRN: [id number]  Agent: Lacey Novak    [facility] Scheduling Message    Patient calling to advise he needs a PA for his Wegovy , requesting we escalate this as his refill is due soon.     Call back number:  [phone]

## 2024-04-12 ENCOUNTER — Ambulatory Visit
Admit: 2024-04-12 | Discharge: 2024-04-12 | Payer: PRIVATE HEALTH INSURANCE | Attending: Physical Medicine & Rehabilitation | Primary: Family

## 2024-04-12 DIAGNOSIS — M5416 Radiculopathy, lumbar region: Principal | ICD-10-CM

## 2024-04-12 MED ORDER — PREDNISONE 20 MG TABLET
20 | ORAL_TABLET | ORAL | 0 refills | 30.00000 days | Status: AC
Start: 2024-04-12 — End: 2024-04-18

## 2024-04-12 NOTE — Telephone Encounter (Signed)
 Sent patient a message via portal to let us  know if prior shara is needed for new script

## 2024-04-12 NOTE — Progress Notes (Signed)
 [address]  [place], [place] [address]    [address]  [place], [place] [address]    Phone: [phone]  Fax:      [phone]  E-mail: [email or web address]         PATIENT NAME:  Timothy Morris   PATIENT DOB:  October 06, 1970   PCP:   DEBBY GENRE, NP   PROVIDER:  Lucendia CLEMENTEEN Knows, MD   ENCOUNTER DATE: 04/12/2024  OTHER PARTICIPANT: none      Chief complaint/History of Present Illness:  Timothy Morris is a pleasant 53 y.o. year old male who presents for a follow up evaluation of his lower back pain without radiation.     Timothy Morris rates his pain as 10/10 VAS at its worst. The pain is described as sharp and can be exacerbated by standing and sleeping in a bad position while alleviated by none.     Timothy Morris had a L5-S1 interlaminar epidural steroid injection on 08/08/22 with 50% relief for three weeks.     Timothy Morris states that last week Timothy Morris was leaning inside of his car and felt an exacerbation of his pain. Since then Timothy Morris states Timothy Morris is in agony and the back pain has been functionally limiting.      Most recently, on 10/18/22 Timothy Morris had a LEFT and RIGHT L2-4 medial branch and L5 dorsal rami radiofrequency ablation with 50% relief for the first 2 months after procedure and than pain started to gradually come back. Timothy Morris reports that for the past 3-4 months Timothy Morris has been in agony du to the pain on the Left lower back radiating to the Left hip, buttock and left posterior leg. Timothy Morris admits numbness on the Left posterior thigh if sitting for more than 1 minute.    His most recent MRI LS spine was at [facility] on 10/03/21.   Results  Imaging    MRI of the lower back: Multilevel endplate degeneration with endplate fatty infiltration and fluid infiltration. An annular tear is present at the L4-5 level.     History of Present Illness  Timothy Morris reports persistent lower back pain, which has recently extended to his left hip. Prolonged sitting exacerbates the discomfort, leading to a tingling sensation and numbness. Timothy Morris also experiences morning stiffness. Timothy Morris has  not been adhering to his prescribed physical therapy regimen but attempts to alleviate the symptoms through stretching exercises. His current medication regimen includes Aleve, taken upon waking, which Timothy Morris finds ineffective in managing his symptoms.    History:  Medical History[1]  Surgical History[2]  Family History[3]   Social History[4]   Ace inhibitors    Medications:  Current Outpatient Medications   Medication Instructions    amLODIPine  (NORVASC ) 5 mg, oral, Daily    atorvastatin  (LIPITOR) 40 mg, oral, Daily, To lower cholesterol    EPINEPHrine  (EPIPEN ) 0.3 mg, intramuscular, Once as needed, Call 911 after use.    metoprolol  succinate XL (TOPROL -XL) 50 mg, oral, Daily, Do not crush or chew    omeprazole  (PRILOSEC) 20 mg, oral, Daily before breakfast, Do not crush or chew.    predniSONE  (Deltasone ) 20 mg tablet Take 3 tablets (60 mg) by mouth with breakfast for 1 day, THEN 3 tablets (60 mg) with breakfast for 1 day, THEN 2 tablets (40 mg) with breakfast for 1 day, THEN 2 tablets (40 mg) with breakfast for 1 day, THEN 1 tablet (20 mg) with breakfast for 1 day, THEN 1 tablet (20 mg) with breakfast for 1 day.    triamterene -hydrochlorothiazid (  Maxzide -25mg ) 37.5-25 mg tablet 1 tablet, oral, Daily    Wegovy  2.4 mg, subcutaneous, Every 7 days       Review of Systems:  Pertinent items are noted in HPI    PHYSICAL EXAMINATION:  Physical Exam  GENERAL APPEARANCE: well appearing, alert and oriented x3, appropriate mood/affect in no acute distress.  CHEST: well appearing, alert and oriented x3, appropriate mood/affect in no acute distress.  HEENT: Normocephaliic, atraumatic.  SKIN: No acute lesions affecting the face, trunk or upper/lower extremities.  GAIT: within normal limits.  SPINE, Musculoskeletal: Quadriceps: 1+ reflexes bilaterally, Achilles: 1+ reflexes bilaterally, Left ankle dorsiflexors: Strength 4+/5, Right great toe: Strength 5/5, Ankle plantar flexors: Strength 5/5 bilaterally.  Neurological:  Normal.  MUSCLE STRENGTH: Left ankle dorsiflexors: Strength 4+/5, Right great toe: Strength 5/5, Ankle plantar flexors: Strength 5/5 bilaterally.  MUSCLE TENDON REFLEXES: Quadriceps: 1+ reflexes bilaterally, Achilles: 1+ reflexes bilaterally.  Other observations: No sustained clonus could be elicited in either ankle.         Assessment/Plan:    Spinal anatomy using the patient's imaging as an example was reviewed.  Management including physical therapy, spinal injections, and surgery was discussed.  A stretching and exercise program was presented and encouraged.    Problem List Items Addressed This Visit           ICD-10-CM       Nervous    Lumbar radiculopathy - Primary M54.16    Overview   09/20/21: the patient pain has been refractory to conservative management and more advanced treatment including LEFT and RIGHT L3-4 medial branch and L5 dorsal rami radiofrequency ablation performed at least a year ago.  Timothy Morris has continued a daily exercise program without any long term benefit.  10/03/2021: MR, LS spine, [place]: Posterior broad-based disc bulging at the L5-S1 level, asymmetric to the left, in a position to irritate the left S1 nerve root.  10/04/21: Left-sided dorsiflexor weakness.  Absent left Achilles reflex.  11/17/2021: L5-S1 interlaminar epidural steroid injection with 70% relief for ~2 weeks.  REC: repeat L5-S1 interlaminar epidural steroid injection   07/06/22: ongoing LEFT sided radiating low back pain.  Above ordered interlaminar epidural steroid injection was never performed or approved.  EXAM: LEFT DF/PF 4+/5 strength.  REC: L5-S1 interlaminar epidural steroid injection   08/08/22: L5-S1 interlaminar epidural steroid injection with 50% relief for three weeks.   09/12/22: leaned into a car 3 weeks prior.  Could not straight up and felt like Timothy Morris was in agony. REC: oral steroid taper.  If no relief, consider repeat L5-S1 interlaminar epidural steroid injection   09/21/22: maximum 30% relief.  Mostly low back  pain.  Please see lumbar spondylosis  04/12/2024 Lucendia CLEMENTEEN Knows, MD, Physical Medicine & Rehabilitation: Follow-up visit for evaluation of left-sided radiating low back pain.  Recent significant exacerbation with increased pain.  Exam: 4+/5 left dorsiflexion strength.  Recommendation: Oral steroid taper for palliation.  Left paracentral L3-4 interlaminar epidural steroid injection.         Relevant Medications    predniSONE  (Deltasone ) 20 mg tablet    Other Relevant Orders    Lumbar Epidural Steroid Injection        This visit note was drafted with the help of artificial intelligence. I obtained consent to record the visit from all participants for this purpose.    Cristan Scherzer, MD       Total time spent (excluding billable services): 20 minutes for this follow up visit.      Preparing  to see the patient.       [place] review and reconciliation of medical/surgical history, medications, review of systems,       Time the provider spends taking a patient's history.      Performing a medically appropriate exam and/or evaluation.      Counseling and educating the patient/family/caregiver.      Ordering medications, tests, or procedures.      Referring and communicating with other healthcare professionals.      Time spent documenting clinical information.      Independent interpretation of results and communicating results to the patient/family/caregiver and care coordination       Lucendia CLEMENTEEN Knows, MD, [facility]  Physical Medicine & Rehabilitation, [facility]  Assistant Clinical Professor, [facility] [employer]         [1]   Past Medical History:  Diagnosis Date    Fatty liver     History of gout     Primary hypertension     [2]   Past Surgical History:  Procedure Laterality Date    CARPAL TUNNEL RELEASE Left 2025    endoscopic    ESOPHAGOGASTRODUODENOSCOPY  2023    INGUINAL HERNIA REPAIR      KNEE ARTHROSCOPY W/ DEBRIDEMENT Right     x2    LEFT COLECTOMY      Sigmoid    ROTATOR CUFF REPAIR Right    [3]    Family History  Problem Relation Name Age of Onset    Breast cancer Mother      No Known Problems Sister     [4]   Social History  Socioeconomic History    Marital status: Divorced     Spouse name: Not on file    Number of children: 2    Years of education: Not on file    Highest education level: Not on file   Occupational History    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   Tobacco Use    Smoking status: Never    Smokeless tobacco: Never   Vaping Use    Vaping status: Never Used   Substance and Sexual Activity    Alcohol use: Never    Drug use: Yes     Types: Marijuana    Sexual activity: Not on file   Other Topics Concern    Not on file   Social History Narrative    Not on file     Social Determinants of Health     Financial Resource Strain: Low Risk (07/10/2023)    Overall Financial Resource Strain (CARDIA)     Difficulty of Paying Living Expenses: Not very hard   Food Insecurity: Unknown (07/10/2023)    Hunger Vital Sign     Worried About Running Out of Food in the Last Year: Patient declined     Ran Out of Food in the Last Year: Not on file   Transportation Needs: Patient Declined (07/10/2023)    PRAPARE - Therapist, Art (Medical): Patient declined     Lack of Transportation (Non-Medical): Patient declined   Physical Activity: Not on file   Stress: Not on file   Social Connections: Not on file   Intimate Partner Violence: Not on file   Housing Stability: Unknown (07/10/2023)    Housing Stability Vital Sign     Unable to Pay for Housing in the Last Year: Not on file  Number of Times Moved in the Last Year: Not on file     Homeless in the Last Year: Patient declined

## 2024-04-18 NOTE — Telephone Encounter (Signed)
 Appointment:        Date of patient's next encounter in the current department:  Visit date not found   Date of patient's next encounter with the current provider:  Visit date not found   Date of patient's last encounter in the current department: 04/11/2024      Last BP:   BP Readings from Last 3 Encounters:   02/01/24 138/86   12/21/23 136/86   11/27/23 128/77       Last HGBA1C:   HgbA1C (%)   Date Value   12/21/2022 5.5       Labs  TSH   Date Value Ref Range Status   12/21/2022 1.160 0.450 - 4.500 uIU/mL Final     Comment:     No apparent thyroid disorder. Additional testing not indicated. In  rare instances, Secondary Hypothyroidism as well as Subclinical  Hypothyroidism have been reported in some patients with normal TSH  values.       Lab Results   Component Value Date    CALCIUM 9.3 02/01/2024    ALBUMIN 4.2 11/26/2021    NA 137 02/01/2024    K 4.0 02/01/2024    CO2 21 02/01/2024    CL 101 02/01/2024    BUN 23 02/01/2024    CREATININE 1.19 02/01/2024      AST   Date Value Ref Range Status   02/01/2024 40 0 - 40 IU/L Final   11/01/2021 52 (H) 6 - 42 U/L Final   11/16/2020 35 6 - 42 U/L Final     AST (SGOT)   Date Value Ref Range Status   11/26/2021 53 (H) 15 - 37 IU/L Final     ALT   Date Value Ref Range Status   02/01/2024 53 (H) 0 - 44 IU/L Final   11/01/2021 104 (H) 0 - 55 U/L Final   11/16/2020 64 (H) 0 - 55 U/L Final     ALT (SGPT)   Date Value Ref Range Status   11/26/2021 99 (H) 0 - 55 IU/L Final     Comment:     Lab Director: CURTISTINE ARVIN     No components found for: TBILI  Hemoglobin   Date Value Ref Range Status   11/01/2021 15.1 13.0 - 17.5 g/dL Final     Hgb   Date Value Ref Range Status   02/01/2024 14.4 13.0 - 17.7 g/dL Final     Hematocrit   Date Value Ref Range Status   11/01/2021 42.6 37.0 - 53.0 % Final     Hct   Date Value Ref Range Status   02/01/2024 42.6 37.5 - 51.0 % Final     Platelets   Date Value Ref Range Status   02/01/2024 153 150 - 450 x10E3/uL Final   11/01/2021 241 150 -  400 K/uL Final     No results found for: PTT  No results found for: INR  No results found for: PROTIME  No results found for: PTADJUSTED    Last Urine Drug Screen: on .  Narcotic Medications:    Directives/Controlled Substance Agreement   PMP Appropriate __YES      ___No

## 2024-04-22 NOTE — Telephone Encounter (Signed)
 Patient called regarding wegovy  would like a call back as soon as possible    Call back # [phone]

## 2024-04-25 NOTE — Telephone Encounter (Signed)
 Colman is calling to ask for an update about the prior authorization - I'm seeing the scan from 11/11 of the denial from [employer]     Are we doing an appeal?     Please call Tayt with an update and to give him an idea of what is next from here and/or a timeline - call back #[phone]

## 2024-05-02 NOTE — Telephone Encounter (Signed)
 Laurier is calling in requesting an update on approval please follow up.      Call back number:[phone]

## 2024-05-06 IMAGING — MR COLUNA^LOMBAR
4 of 7 series · 19 of 48 positions shown · non-contrast
Comparison: none

[Series 2: sagital_t2_quiet · sagittal · 4.0mm · 0.55mm/px · 5 of 12 slices shown (1 of 2)]
[im 1/12]
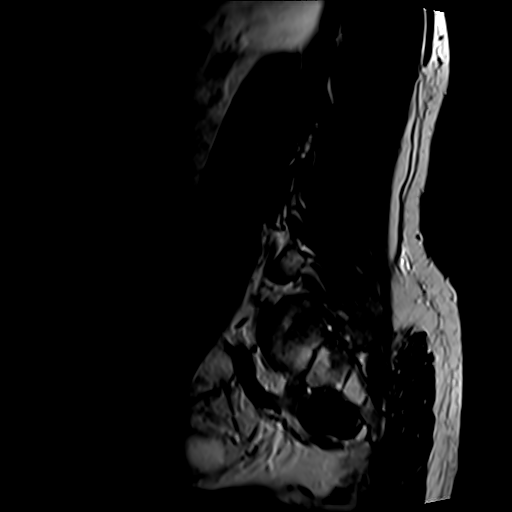
[im 3/12]
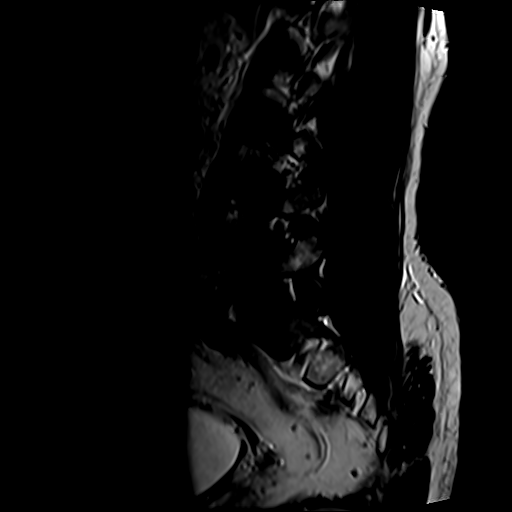
[im 6/12]
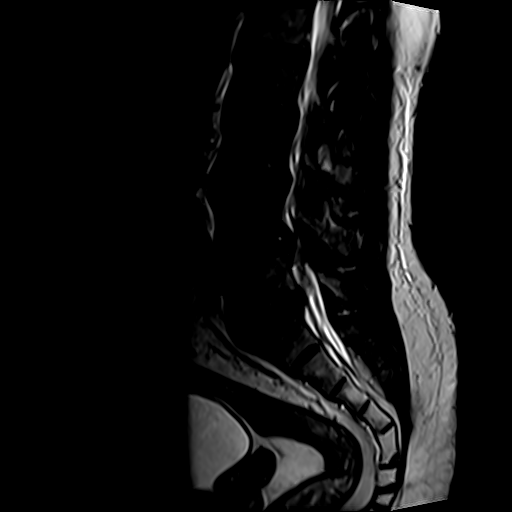
[im 9/12]
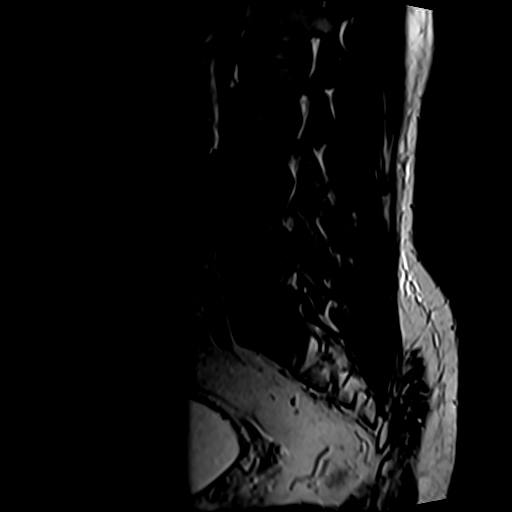
[im 12/12]
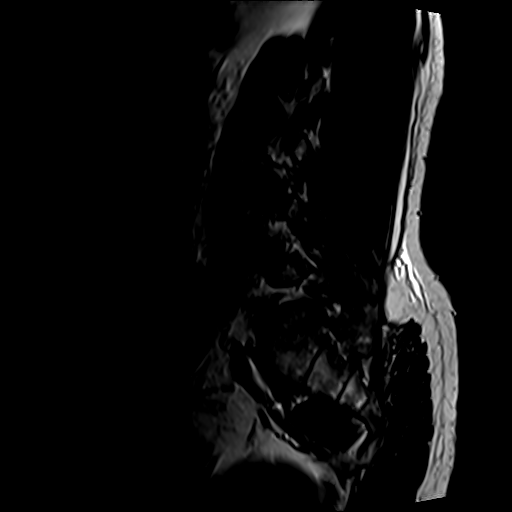

[Series 3: sagital_t1_quiet · sagittal · 4.0mm · 0.55mm/px · 5 of 12 slices shown (1 of 2)]
[im 1/12]
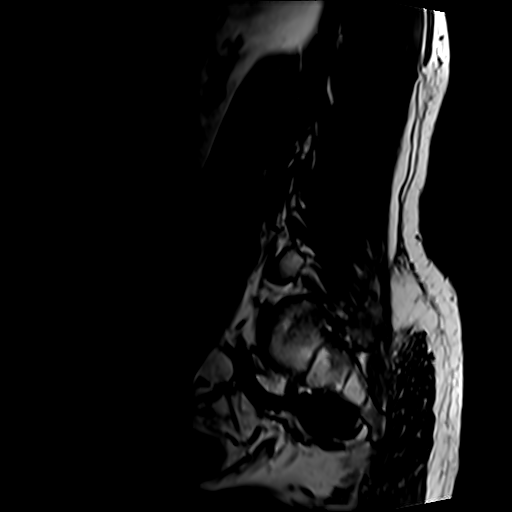
[im 3/12]
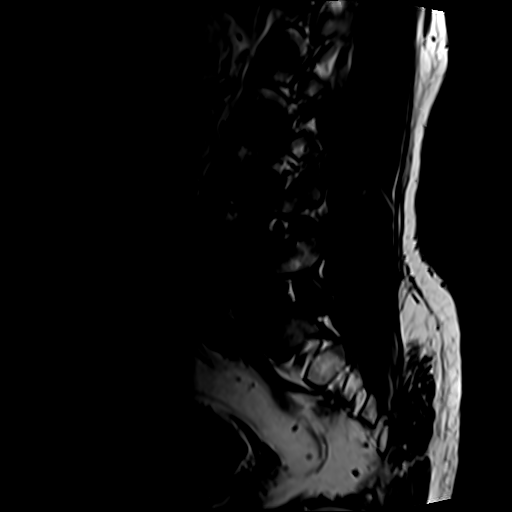
[im 6/12]
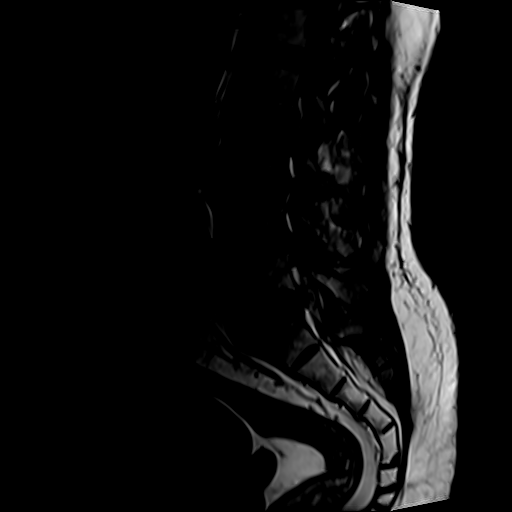
[im 9/12]
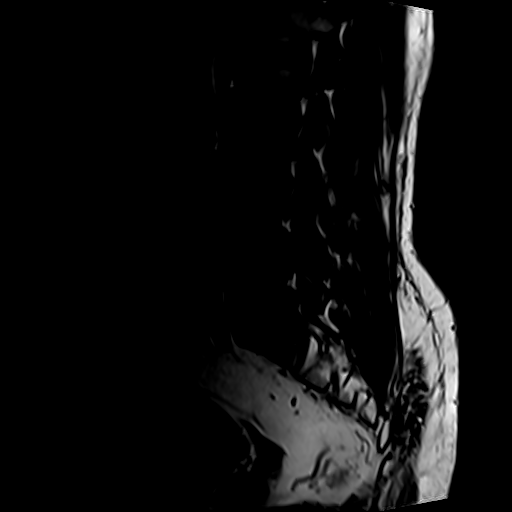
[im 12/12]
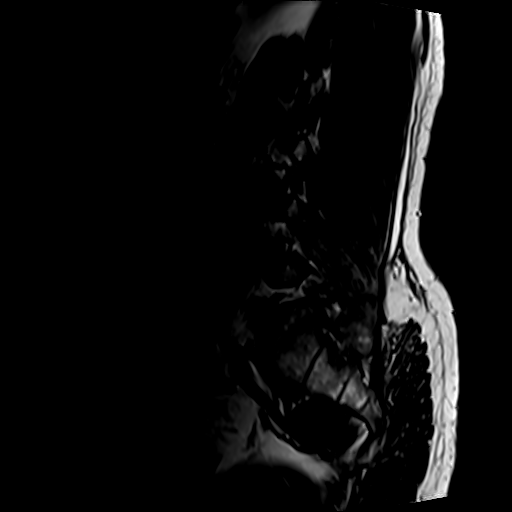

[Series 4: sagital_t2_quiet · sagittal · 4.0mm · 0.55mm/px · 6 of 12 slices shown (2 of 2)]
[im 1/12]
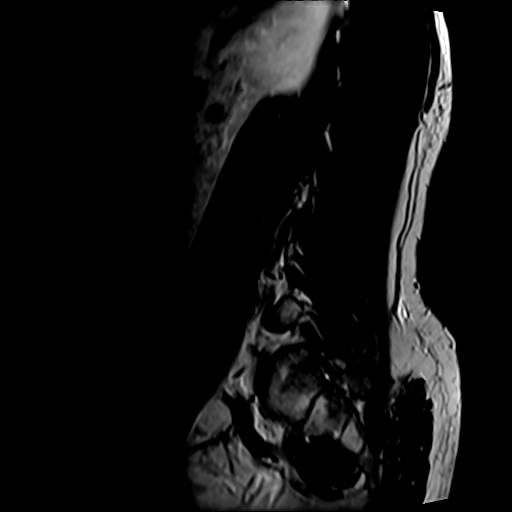
[im 3/12]
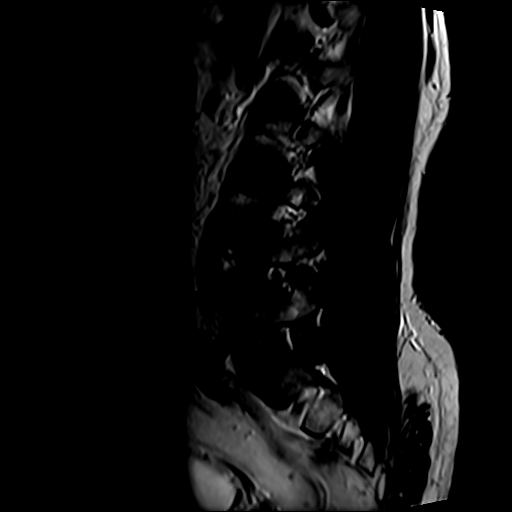
[im 5/12]
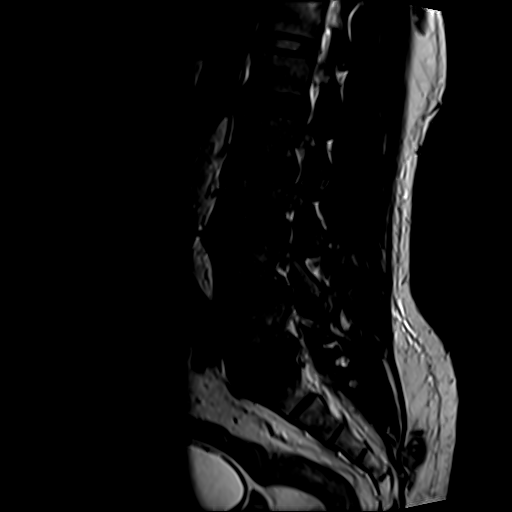
[im 7/12]
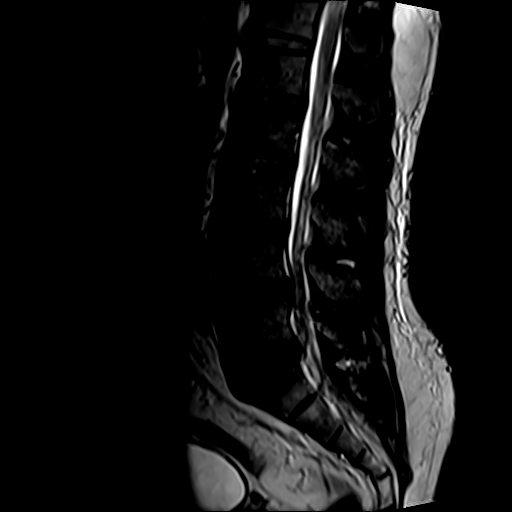
[im 9/12]
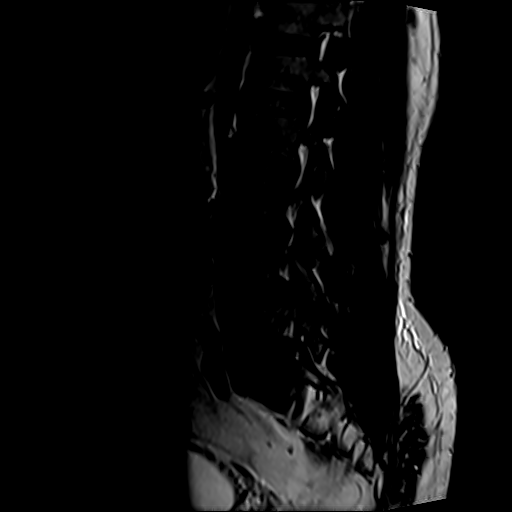
[im 12/12]
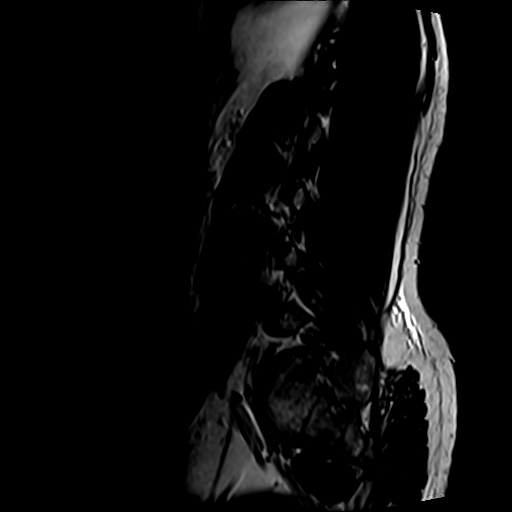

[Series 5: sagital_t1_quiet · sagittal · 4.0mm · 0.55mm/px · 3 of 12 slices shown (2 of 2)]
[im 3/12]
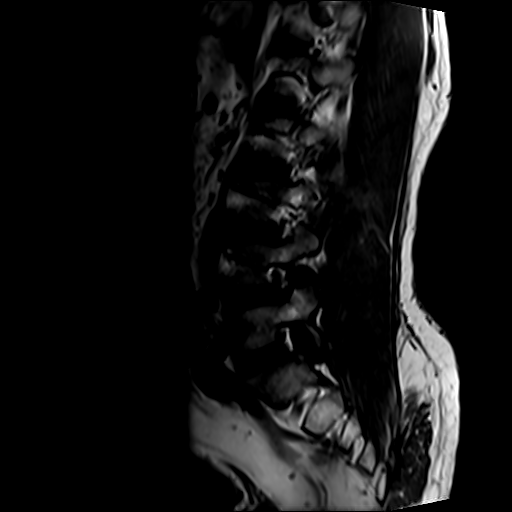
[im 7/12]
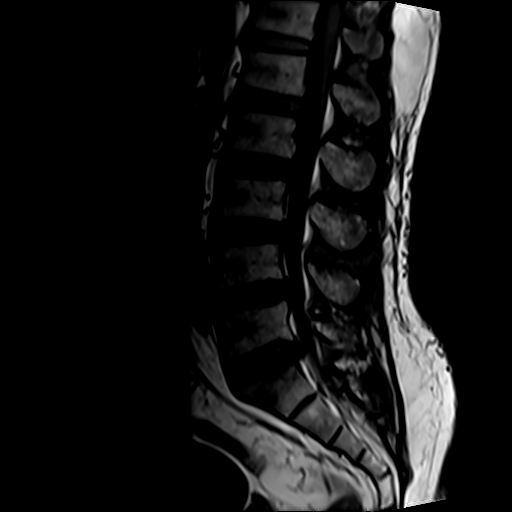
[im 12/12]
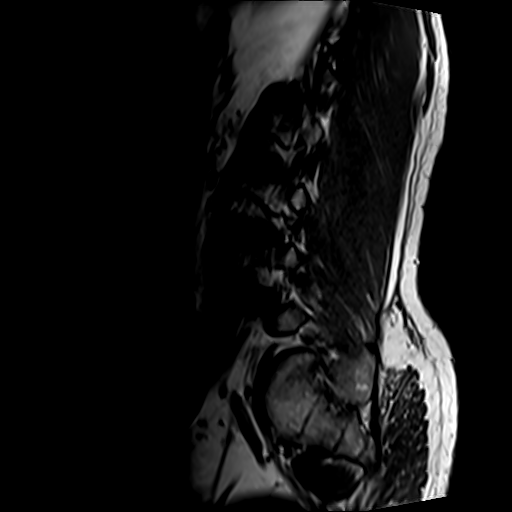

[19 of 48 positions shown; findings below may reference images not displayed]

RESSONÂNCIA MAGNÉTICA DA COLUNA LOMBAR

Técnica: Exame realizado pela técnica de spin-eco, obtendo-se sequências ponderadas
predominantemente em T1 e T2 em aquisições multiplanares.

Análise:
Alinhamento vertebral preservado.
Corpos vertebrais com altura preservada, com osteófitos marginais.
Artropatia degenerativa facetária, mais evidente em L4-L5 e L5-S1.
Espessamento dos ligamentos amarelos em L3-L4 e L4-L5, comprimindo a face
posterior do saco dural.
Sinais de desidratação discal em L3-L4 e L4-L5.
L3-L4: Abaulamento discal difuso que retifica a face anterior do saco dural e estende-
se aos forames de conjugação, determinando leve redução de sua amplitude e tocando
as raízes emergentes.
L4-L5: Abaulamento discal difuso que comprime a face anterior do saco dural e
estende-se aos forames de conjugação, determinando moderada redução de sua
amplitude e comprimindo as raízes emergentes.
L5-S1: Protrusão discal posterocentral que oblitera a gordura epidural anterior, toca o
saco dural e estende-se aos forames de conjugação, determinando leve redução de sua
amplitude e tocando as raízes emergentes.
Canal vertebral com redução da sua amplitude em L4-L5.
Cone medular tópico, de aspecto habitual.
Edema dos ligamentos interespinhosos em L3-L4, L4-L5 e L5-S1, denotando
sobrecarga mecânica.
Musculatura paravertebral posterior preservada.

Impressão diagnóstica:
Discopatia degenerativa multissegmentar.
Abaulamentos discais em L3-L4 e L4-L5 e protrusão discal em L5-S1, com
repercussão sobre as raízes nervosas emergentes, conforme descrito.
Estenose do canal vertebral em L4-L5.
Sinais de sobrecarga mecânica nos ligamentos interespinhosos.

## 2024-05-07 ENCOUNTER — Ambulatory Visit
Admit: 2024-05-07 | Discharge: 2024-05-07 | Payer: PRIVATE HEALTH INSURANCE | Attending: Physical Medicine & Rehabilitation | Primary: Family

## 2024-05-07 DIAGNOSIS — M5416 Radiculopathy, lumbar region: Principal | ICD-10-CM

## 2024-05-07 NOTE — Progress Notes (Signed)
 [address]  [place], [place] [address]    [address]  [place], [place] [address]    Phone: [phone]  Fax:      [phone]  E-mail: [email or web address]        PATIENT NAME: Timothy Morris   PATIENT DOB:  [birth year]   PCP:   DEBBY GENRE, NP  PROCEDURALIST: Lucendia CLEMENTEEN Knows, MD  PROCEDURE DATE: 05/07/2024    There were no vitals filed for this visit.     PROCEDURE NOTE  Lumbar Epidural Steroid Injection    Performed by: Lucendia Knows, MD  Authorized by: Lucendia Knows, MD    Consent:     Consent obtained:  Verbal and written    Consent given by:  Patient    Risks discussed:  Allergic reaction, infection, nerve damage, swelling, bleeding, pain and unsuccessful block    Alternatives discussed:  No treatment, delayed treatment, alternative treatment and referral  Universal protocol:     Procedure explained and questions answered to patient or proxy's satisfaction: yes      Relevant documents present and verified: yes      Test results available: yes      Imaging studies available: yes      Immediately prior to procedure, a time out was called: yes      Patient identity confirmed:  Verbally with patient and provided demographic data  Indications:     Indications:  Pain relief  Levels/Interspaces:     Left interspaces:  L3-L4    Lateral oblique angle: right    Location:     Body area:  Trunk    Trunk nerve: lumbar  Pre-procedure details:     Skin preparation:  Chlorhexidine  Procedure details:   FLUOROSCOPICALLY GUIDED LUMBAR L3-4 INTERLAMINAR EPIDURAL STEROID INJECTION  The procedure was explained. Risks, benefits and alternatives were explained. A consent form was signed. The patient was placed prone on the exam table. The area overlying the lumbosacral spine was exposed and prepared using a chlorhexidine swabs. This area was then allowed to dry and draped. The entry point overlying the interlaminar area was identified using both anatomic landmarks and fluoroscopy. A skin wheal was raised at  the entry point location using preservative free 1% lidocaine  without epinephrine  delivered via a 1.5 2g skin needle. The needle was then inserted and directed toward the interlaminar area. As the needle was withdrawn from each location, 1 mL of preservative free 1% lidocaine  without epinephrine  was injected. A 20g 3.5 Tuohy was then inserted and directed toward the interlaminar area using intermittent fluoroscopy in AP, lateral, and 50 degree contralateral oblique views. The stylus was removed and a loss of resistance syringe was connected to the needle. Then, using the loss of resistance technique to saline the needle was advanced to the epidural space. The location was confirmed through injection of Omnipaque  240 mg/mL AP, lateral, and 50 degree contralateral oblique views were saved to the PACS, as appropriate. Then, 1.5 mL of normal saline mixed with 1.5 mL of 40 mg/mL methylprednisolone was injected into the epidural space. The needle was then withdrawn. The skin was cleansed and a sterile adhesive bandage was placed overlying the entry site. All aspirates were negative. Peri and post procedural complications included: none.    Recovery and Discharge: After the procedure was completed, [place] was transferred to the recovery area and observed by our recovery staff. The discharge instructions and post procedural pain logs were reviewed.  Red flag findings were  discussed with the patient as a reason for emergent evaluation.      Complications:  No immediate procedural complication noted.    Follow-up:  14-28 days post procedure.       Block needle gauge:  20 G    Needle length (in):  3.5    Additive injected:  None    Injection procedure:  Anatomic landmarks identified, anatomic landmarks palpated, incremental injection and negative aspiration for blood    Paresthesia:  None  Medication:   5 mL lidocaine  PF 10 mg/mL (1 %); 1.5 mL sodium chloride  0.9 %; 3 mL iohexol  240 mg iodine /mL; 60 mg methylPREDNISolone  acetate 40 mg/mL  Post-procedure details:     Dressing:  Sterile dressing    Procedure completion:  Tolerated well, no immediate complications  Consent was given by the patient. Immediately prior to procedure a time out was called to verify the correct patient, procedure, equipment, support staff and site/side marked as required.             Lucendia CLEMENTEEN Knows, MD, [email or web address]  Physical Medicine & Rehabilitation, [facility]  Assistant Clinical Professor, [facility] [employer]

## 2024-05-10 MED ORDER — METHYLPREDNISOLONE ACETATE 40 MG/ML SUSPENSION FOR INJECTION
40 | Freq: Once | INTRAMUSCULAR | Status: AC | PRN
Start: 2024-05-10 — End: 2024-05-07
  Administered 2024-05-07: 21:00:00 60 mg via EPIDURAL

## 2024-05-10 MED ORDER — IOHEXOL 240 MG IODINE/ML INTRAVENOUS SOLUTION
240 | Freq: Once | INTRAVENOUS | Status: AC | PRN
Start: 2024-05-10 — End: 2024-05-07
  Administered 2024-05-07: 21:00:00 3 mL via EPIDURAL

## 2024-05-10 MED ORDER — SODIUM CHLORIDE 0.9 % INJECTION SOLUTION
Freq: Once | INTRAMUSCULAR | Status: AC | PRN
Start: 2024-05-10 — End: 2024-05-07
  Administered 2024-05-07: 21:00:00 1.5 mL via EPIDURAL

## 2024-05-10 MED ORDER — LIDOCAINE (PF) 10 MG/ML (1 %) INJECTION SOLUTION
10 | Freq: Once | INTRAMUSCULAR | Status: AC | PRN
Start: 2024-05-10 — End: 2024-05-07
  Administered 2024-05-07: 21:00:00 5 mL

## 2024-05-13 MED ORDER — METOPROLOL SUCCINATE ER 50 MG TABLET,EXTENDED RELEASE 24 HR
50 | ORAL_TABLET | Freq: Every day | ORAL | 1 refills | 90.00000 days | Status: AC
Start: 2024-05-13 — End: ?

## 2024-05-13 NOTE — Telephone Encounter (Signed)
 Appointment:        Date of patient's next encounter in the current department:  Visit date not found   Date of patient's next encounter with the current provider:  Visit date not found   Date of patient's last encounter in the current department: 04/11/2024      Last BP:   BP Readings from Last 3 Encounters:   02/01/24 138/86   12/21/23 136/86   11/27/23 128/77       Last HGBA1C:   HgbA1C (%)   Date Value   12/21/2022 5.5       Labs  TSH   Date Value Ref Range Status   12/21/2022 1.160 0.450 - 4.500 uIU/mL Final     Comment:     No apparent thyroid disorder. Additional testing not indicated. In  rare instances, Secondary Hypothyroidism as well as Subclinical  Hypothyroidism have been reported in some patients with normal TSH  values.       Lab Results   Component Value Date    CALCIUM 9.3 02/01/2024    ALBUMIN 4.2 11/26/2021    NA 137 02/01/2024    K 4.0 02/01/2024    CO2 21 02/01/2024    CL 101 02/01/2024    BUN 23 02/01/2024    CREATININE 1.19 02/01/2024      AST   Date Value Ref Range Status   02/01/2024 40 0 - 40 IU/L Final   11/01/2021 52 (H) 6 - 42 U/L Final   11/16/2020 35 6 - 42 U/L Final     AST (SGOT)   Date Value Ref Range Status   11/26/2021 53 (H) 15 - 37 IU/L Final     ALT   Date Value Ref Range Status   02/01/2024 53 (H) 0 - 44 IU/L Final   11/01/2021 104 (H) 0 - 55 U/L Final   11/16/2020 64 (H) 0 - 55 U/L Final     ALT (SGPT)   Date Value Ref Range Status   11/26/2021 99 (H) 0 - 55 IU/L Final     Comment:     Lab Director: CURTISTINE ARVIN     No components found for: TBILI  Hemoglobin   Date Value Ref Range Status   11/01/2021 15.1 13.0 - 17.5 g/dL Final     Hgb   Date Value Ref Range Status   02/01/2024 14.4 13.0 - 17.7 g/dL Final     Hematocrit   Date Value Ref Range Status   11/01/2021 42.6 37.0 - 53.0 % Final     Hct   Date Value Ref Range Status   02/01/2024 42.6 37.5 - 51.0 % Final     Platelets   Date Value Ref Range Status   02/01/2024 153 150 - 450 x10E3/uL Final   11/01/2021 241 150 -  400 K/uL Final     No results found for: PTT  No results found for: INR  No results found for: PROTIME  No results found for: PTADJUSTED    Last Urine Drug Screen: on .  Narcotic Medications:    Directives/Controlled Substance Agreement   PMP Appropriate __YES      ___No

## 2024-05-20 ENCOUNTER — Ambulatory Visit
Admit: 2024-05-20 | Discharge: 2024-05-20 | Payer: PRIVATE HEALTH INSURANCE | Attending: Student in an Organized Health Care Education/Training Program | Primary: Family

## 2024-05-20 ENCOUNTER — Encounter: Payer: PRIVATE HEALTH INSURANCE | Attending: Physical Medicine & Rehabilitation | Primary: Family

## 2024-05-20 DIAGNOSIS — D225 Melanocytic nevi of trunk: Principal | ICD-10-CM

## 2024-05-20 MED ORDER — KETOCONAZOLE 2 % TOPICAL CREAM
2 | TOPICAL | 3 refills | 30.00000 days | Status: AC
Start: 2024-05-20 — End: ?

## 2024-05-20 NOTE — Progress Notes (Signed)
 DERMATOLOGY FOLLOW-UP PATIENT ENCOUNTER    Past Derm History  - new     HPI  Timothy Morris is a 53 y.o. male who presents for the following:     Skin check  - No personal Hx or FHx of skin cancer  - Has a few bumps to left thigh, asymptomatic, interested in removal  - Also has an itchy spot on left cheek, appeared over past few months  - Patient denies any bleeding, painful, tender, changing or other concerning lesions at this time  - Patient denies any recent unintentional weight loss  - Patient presents for skin cancer surveillance screening    ROS  Patient feels well. No other skin complaints. No other systemic symptoms.    Physical Exam/Impression/Plan  Well appearing patient in no apparent distress; mood and affect are within normal limits. Skin exam was performed of the following and pertinent positives are included below:   Face, neck, chest, back, abdomen, upper extremities, hands, lower extremities, feet, digits and nails, groin, buttocks, scalp.   Skin Exam  MELANOCYTIC NEVI OF TRUNK  Generalized  Scattered multiple brown to pink macules and papules with regular pigment patterns on dermoscopy.    - Benign appearing, reassured.  Counseled on interval self-examinations, ABCDEs of melanoma, photo-protection, and to return to clinic if there are any changes or concerns.   LENTIGINES  Generalized  Dermatoheliosis/lentigines- reticulated brown macules in sun distribution. [place] protection was also discussed in full. The proper use of broad-spectrum UVB/UVA sunscreens with SPF 30 or greater was reviewed and the need for re-application after swimming or sweating or 2-3 hours was emphasized. We talked about judicious use of clothing and avoidance of peak periods of sun exposure. I made the patient aware of the need for year-round protection and discussed the fact that UVA can go through window glass.   SEBORRHEIC KERATOSIS  Generalized  Stuck-on verrucous papule(s).   -Benign, reassured.   INFLAMED SEBORRHEIC  KERATOSIS  Left Buccal Cheek  Inflamed stuck-on verrucous papule(s).    -Benign, reassured.  Treatment options discussed.    -Patient consents for in-office treatment with cryotherapy today  - Cryotherapy - Left Buccal Cheek    Number of cycles:  2  Complexity: simple    Procedure Note: Verbal consent obtained. Risk of pain, scar, recurrence, discoloration, blistering, infection discussed. Wound care instructions discussed.   This procedure was medically necessary because the following was true about the lesion that was treated:  Inflamed and Irritated.    ACROCHORDON  Left Thigh - Posterior  Brown pedunculated papules to left posterior thigh  - Benign, reassurance provided  - Discussed treatment is considered an Out-of-Pocket cosmetic cost ($295 for the 3 lesions) - patient reports will contact Department when ready to schedule out of [place]/ office  - Patient provided with ICD-10 and procedure codes to inquire with insurance company to evaluate for coverage  TINEA PEDIS OF BOTH FEET  Generalized  Interdigital maceration with scattered hyperkeratosis  STATUS: Chronic, not at treatment goal  - Infectious etiology and treatment options discussed.   - START ketoconazole  2% cream BID x4-weeks or QHS x6-weeks  - ketoconazole  (NIZOral ) 2 % cream - Apply topically twice daily to affected areas of feet, stop when clear.  HYPERKERATOSIS OF SKIN (2)  Left Foot - Anterior, Right Foot - Anterior  Hyperkeratotic plaque to posterior ankle and scattered dorsal toes  STATUS: Chronic, not at treatment goal  - Counseled on etiology  - Recommended OTC urea 10-40% cream  versus Amlactin/lactic acid 12% QD  Other Procedures Placed This Visit  - EXT  Dermatology Referral    RTC in 1 year for FBSE or sooner PRN lesions of concern. Patient will contact department if interested in cosmetic destruction of lesions above    Patient seen and discussed with Dermatology Attending, Dr. Viktoria Koren Passer, MD, PharmD, [place]  PGY-4  Dermatology Resident    I personally interviewed and examined the patient and both the resident and I contributed to this electronic note.  I agree with the history, exam, assessment and plan as detailed in this note and edited it as necessary.    Bernice LITTIE Viktoria, MD  Dermatology Attending

## 2024-05-21 ENCOUNTER — Encounter: Payer: PRIVATE HEALTH INSURANCE | Attending: Hand Surgery | Primary: Family

## 2024-05-27 ENCOUNTER — Ambulatory Visit
Admit: 2024-05-27 | Discharge: 2024-05-27 | Payer: PRIVATE HEALTH INSURANCE | Attending: Physical Medicine & Rehabilitation | Primary: Family

## 2024-05-27 NOTE — Progress Notes (Signed)
 [address]  [place], [place] [address]    [address]  [place], [place] [address]    Phone: [phone]  Fax:      [phone]  E-mail: [email or web address]         PATIENT NAME:  Timothy Morris   PATIENT DOB:  [birth year]   PCP:   DEBBY GENRE, NP   PROVIDER:  Lucendia CLEMENTEEN Knows, MD   ENCOUNTER DATE: 05/27/2024  OTHER PARTICIPANT: none      Chief complaint/History of Present Illness:  Timothy Morris is a pleasant 53 y.o. year old male who presents for a follow up evaluation of his lower back pain without radiation.     Timothy Morris rates his pain as 10/10 VAS at its worst. The pain is described as sharp and can be exacerbated by standing and sleeping in a bad position while alleviated by none.     Timothy Morris had a L5-S1 interlaminar epidural steroid injection on 08/08/22 with 50% relief for three weeks.     Timothy Morris states that last week he was leaning inside of his car and felt an exacerbation of his pain. Since then he states he is in agony and the back pain has been functionally limiting.     On 10/18/22 he had a LEFT and RIGHT L2-4 medial branch and L5 dorsal rami radiofrequency ablation with 50% relief for the first 2 months after procedure and than pain started to gradually come back. He reports that for the past 3-4 months he has been in agony du to the pain on the Left lower back radiating to the Left hip, buttock and left posterior leg. He admits numbness on the Left posterior thigh if sitting for more than 1 minute.     His most recent MRI LS spine was at [facility] on 10/03/21.     Most recently, on 05/07/24 he had a Left paracentral L3-4 interlaminar epidural steroid injection with 30% ongoing relief. He continues to feel constant pain on the low back radiating to thel eft Hip and Left lateral leg, especially when prolonged sitting and standing.     History of Present Illness  The patient is a 52 year old male who presents for evaluation of back pain. He reports persistent discomfort in his back, which is exacerbated  by prolonged sitting or standing. He experiences a significant decrease in strength upon rising from a seated position. 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 He also reports a sensation of numbness and tingling in his leg while lying in bed. He expresses a desire for further intervention to alleviate his daily pain.    History:  Medical History[1]  Surgical History[2]  Family History[3]   Social History[4]   Ace inhibitors    Medications:  Current Outpatient Medications   Medication Instructions    amLODIPine  (NORVASC ) 5 mg, oral, Daily    atorvastatin  (LIPITOR ) 40 mg, oral, Daily, To lower cholesterol    EPINEPHrine  (EPIPEN ) 0.3 mg, intramuscular, Once as needed, Call 911 after use.    ketoconazole  (NIZOral ) 2 % cream Apply topically twice daily to affected areas of feet, stop when clear.    metoprolol  succinate XL (TOPROL -XL) 50 mg, oral, Daily, Do not crush or chew    omeprazole  (PRILOSEC ) 20 mg, oral, Daily before breakfast, Do not crush or chew.    triamterene -hydrochlorothiazid (Maxzide -25mg ) 37.5-25 mg tablet 1 tablet, oral, Daily  Wegovy  2.4 mg, subcutaneous, Every 7 days       Review of Systems:  Pertinent items are noted in HPI    PHYSICAL EXAMINATION:  Physical Exam  GENERAL APPEARANCE: well appearing, alert and oriented x3, appropriate mood/affect in no acute distress.  CHEST: well appearing, alert and oriented x3, appropriate mood/affect in no acute distress.  HEENT: Normocephaliic, atraumatic.  SKIN: No acute lesions affecting the face, trunk or upper/lower extremities.  GAIT: within normal limits.  Neurological: Strength in the left ankle dorsiflexors and quadriceps is 4+/5, while the right side shows 5/5 strength.  MUSCLE STRENGTH: Strength in the left ankle dorsiflexors and quadriceps is 4+/5, while the right side shows 5/5 strength.          Assessment/Plan:    Spinal anatomy using the patient's imaging as an example was reviewed.  Management including physical therapy, spinal injections, and surgery was discussed.  A stretching and exercise program was presented and encouraged.    Problem List Items Addressed This Visit           ICD-10-CM       Nervous    Lumbar radiculopathy M54.16    Overview   09/20/21: the patient pain has been refractory to conservative management and more advanced treatment including LEFT and RIGHT L3-4 medial branch and L5 dorsal rami radiofrequency ablation performed at least a year ago.  He has continued a daily exercise program without any long term benefit.  10/03/2021: MR, LS spine, [place]: Posterior broad-based disc bulging at the L5-S1 level, asymmetric to the left, in a position to irritate the left S1 nerve root.  10/04/21: Left-sided dorsiflexor weakness.  Absent left Achilles reflex.  11/17/2021: L5-S1 interlaminar epidural steroid injection with 70% relief for ~2 weeks.  REC: repeat L5-S1 interlaminar epidural steroid injection   07/06/22: ongoing LEFT sided radiating low back pain.  Above ordered interlaminar epidural steroid injection was never performed or approved.  EXAM: LEFT DF/PF 4+/5 strength.  REC: L5-S1 interlaminar epidural steroid injection   08/08/22: L5-S1 interlaminar epidural steroid injection with 50% relief for three weeks.   09/12/22: leaned into a car 3 weeks prior.  Could not straight up and felt like he was in agony. REC: oral steroid taper.  If no relief, consider repeat L5-S1 interlaminar epidural steroid injection   09/21/22: maximum 30% relief.  Mostly low back pain.  Please see lumbar spondylosis  04/12/2024 Lucendia CLEMENTEEN Knows, MD, Physical Medicine & Rehabilitation: Follow-up visit for evaluation of left-sided radiating low back pain.  Recent significant exacerbation with increased pain.  Exam: 4+/5 left dorsiflexion strength.  Recommendation: Oral steroid taper for palliation.  Left  paracentral L3-4 interlaminar epidural steroid injection.  04/30/2024  Lucendia CLEMENTEEN Knows, MD, Physical Medicine & Rehabilitation:  LEFT para central L3-4 interlaminar epidural steroid injection   05/27/2024 Lucendia CLEMENTEEN Knows, MD, Physical Medicine & Rehabilitation:  follow up evaluation post L3-4 interlaminar epidural steroid injection.  Admits 30% relief and persistent left sided radiation low back pain.  EXAM: 4+/5 left ankle dorsiflexion and quadricep strength.  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  2023 MRI showed type II Modic changes at the L2-3, L3-4, L4-5 levels anteriorly.  Recommend: New MRI of the lumbosacral spine to help evaluate any evolution of endplate degeneration and help plan for future intervention which may include basivertebral ablations.         Relevant Orders    MR LUMBAR SPINE WO CONTRAST     Other Visit Diagnoses         Codes      L-S radiculopathy    -  Primary M54.17    Relevant Orders    Lumbar Transforaminal Steroid Injection             This visit note was drafted with the help of artificial intelligence. I obtained consent to record the visit from all participants for this purpose.    Nobuo Nunziata, MD       Total time spent (excluding billable services): 30 minutes for this follow visit.      Preparing to see the patient.       [place] review and reconciliation of medical/surgical history, medications, review of systems,       Time the provider spends taking a patient's history.      Performing a medically appropriate exam and/or evaluation.      Counseling and educating the patient/family/caregiver.       Ordering medications tests or procedures.      Referring and communicating with other healthcare professionals.      Time spent documenting clinical information.      Independent interpretation of results and communicating results to the patient/family/caregiver and care coordination       Lucendia CLEMENTEEN Knows, MD, [facility]  Physical Medicine & Rehabilitation, [facility]  Assistant Clinical Professor, [employer] [facility]         [1]   Past Medical History:  Diagnosis Date    Fatty liver     History of gout     Primary hypertension    [2]   Past Surgical History:  Procedure Laterality Date    CARPAL TUNNEL RELEASE Left 2025    endoscopic    ESOPHAGOGASTRODUODENOSCOPY  2023    INGUINAL HERNIA REPAIR      KNEE ARTHROSCOPY W/ DEBRIDEMENT Right     x2    LEFT COLECTOMY      Sigmoid    ROTATOR CUFF REPAIR Right    [3]   Family History  Problem Relation Name Age of Onset    Breast cancer Mother      No Known Problems Sister     [4]   Social History  Socioeconomic History    Marital status: Divorced     Spouse name: Not on file    Number of children: 2    Years of education: Not on file    Highest education level: Not on file   Occupational History    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   Tobacco Use    Smoking status: Never    Smokeless tobacco: Never   Vaping Use    Vaping status: Never Used   Substance and Sexual Activity    Alcohol use: Never    Drug use: Yes     Types: Marijuana    Sexual activity: Not on file   Other Topics Concern    Not on file   Social History Narrative    Not on file  Social Determinants of Health     Financial Resource Strain: Low Risk (07/10/2023)    Overall Financial Resource Strain (CARDIA)     Difficulty of Paying Living Expenses: Not very hard   Food Insecurity: Unknown (07/10/2023)    Hunger Vital Sign     Worried About Running Out of Food in the Last Year: Patient declined     Ran Out of Food in the Last Year: Not on  file   Transportation Needs: Patient Declined (07/10/2023)    PRAPARE - Therapist, Art (Medical): Patient declined     Lack of Transportation (Non-Medical): Patient declined   Physical Activity: Not on file   Stress: Not on file   Social Connections: Not on file   Intimate Partner Violence: Not on file   Housing Stability: Unknown (07/10/2023)    Housing Stability Vital Sign     Unable to Pay for Housing in the Last Year: Not on file     Number of Times Moved in the Last Year: Not on file     Homeless in the Last Year: Patient declined

## 2024-06-04 ENCOUNTER — Ambulatory Visit: Admit: 2024-06-04 | Discharge: 2024-06-04 | Payer: PRIVATE HEALTH INSURANCE | Attending: Hand Surgery | Primary: Family

## 2024-06-04 DIAGNOSIS — M1812 Unilateral primary osteoarthritis of first carpometacarpal joint, left hand: Principal | ICD-10-CM

## 2024-06-04 MED ORDER — TRIAMCINOLONE ACETONIDE 40 MG/ML SUSPENSION FOR INJECTION
40 | Freq: Once | INTRAMUSCULAR | Status: AC | PRN
Start: 2024-06-04 — End: 2024-06-04
  Administered 2024-06-04: 21:00:00 20 mg via INTRA_ARTICULAR

## 2024-06-04 NOTE — Progress Notes (Signed)
 [address]  [place], [place] [address]    [address]  [place], [place] [address]    Phone: [phone]  Fax:      [phone]  E-mail: [email or web address]      HPI  Timothy Morris is a 53 y.o. male with a chief complaint of left radial hand pain.  A left thumb cmc steroid injection  in March helped for several months.  He had left carpal tunnel release surgery in February and it took a few months but his left hand numbness and tingling resolved.  He feels weak. Pain with lifting a pan.    He has numbness and tingling in the right hand.    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    Medical History[1]    Surgical History[2]    Social History     Occupational History    Occupation: Short Order [employer] Time     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   Tobacco Use    Smoking status: Never    Smokeless tobacco: Never   Vaping Use    Vaping status: Never Used   Substance and Sexual Activity    Alcohol use: Never    Drug use: Yes     Types: Marijuana    Sexual activity: Not on file       Current Outpatient Medications   Medication Instructions    amLODIPine  (NORVASC ) 5 mg, oral, Daily    atorvastatin  (LIPITOR ) 40 mg, oral, Daily, To lower cholesterol    EPINEPHrine  (EPIPEN ) 0.3 mg, intramuscular, Once as needed, Call 911 after use.    ketoconazole  (NIZOral ) 2 % cream Apply topically twice daily to affected areas of feet, stop when clear.    metoprolol  succinate XL (TOPROL -XL) 50 mg, oral, Daily, Do not crush or chew    omeprazole  (PRILOSEC ) 20 mg, oral, Daily before breakfast, Do not crush or chew.    triamterene -hydrochlorothiazid (Maxzide -25mg ) 37.5-25 mg tablet 1 tablet, oral, Daily    Wegovy  2.4 mg, subcutaneous, Every 7 days       Allergies[3]    Exam  Ht Readings from Last 1 Encounters:   02/01/24 1.778 m     Wt Readings from Last 1 Encounters:   02/01/24 105.2 kg     BMI Readings from Last 1 Encounters:   02/01/24 33.29 kg/m     Comfortable. Normal mood. Cooperative with exam.     Both hands  are examined.  Skin is intact both hands.  No atrophy of either hand.    Healed left volar wrist surgical scar.    He can make a fist and extend all his fingers bilaterally.  Symmetric painless wrist motion for flexion extension bilaterally.  Positive thumb CMC grind test on the left not the right.    Decreased sensation to light touch in all the fingers of the right hand compared to the left although he also has decreased sensation to light touch at the left thumb tip at the level of a previous laceration injury.    Diagnostic Tests  XR HAND LEFT 3+ VIEWS  Imaging Result: 06/27/2023     Indications: Left radial hand pain.     Findings: PA, lateral and oblique views of the left hand shows No   fractures.  No scapholunate ligament widening.  MCP and IP joints are well   aligned.  Severe thumb CMC joint space narrowing with small osteophytes.     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    Assessment & Plan  Arthritis of carpometacarpal [facility]) joint of left thumb [M18.12]   1. Arthritis of carpometacarpal (CMC) joint of left thumb    2. Right carpal tunnel syndrome      Left thumb basilar pain is due to thumb CMC arthritis.  He wanted another steroid injection which I gave him to the left thumb CMC joint.    I also showed him how to purchase a push MetaGrip brace which he may be able to use at work.  Avoid repetitive pinching on the left thumb.  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    At some point I would recommend surgery for right endoscopic carpal tunnel release given his decreased sensation in the right hand fingers.  With the left severe carpal tunnel syndrome it can take 6 or 7 months for finger sensation to reach proximal improvement.  It took him a few months to get better on the left hand after endoscopic carpal tunnel release.    He is going to follow-up as  needed.  I reminded him that steroid injections do not fix arthritis but they calm down the pain and inflammation for 3 months.    Patient ID: Timothy Morris is a 53 y.o. male.    S Inj/Asp: L thumb CMC  Details: 25 G needle  Medications: 20 mg triamcinolone  acetonide 40 mg/mL    I explained the risks of injection including pain, infection, bleeding, numbness, need for repeat procedure and damage to nerves, tendons, ligaments and blood vessels.  After verbal consent was obtained and after sterile prep with alcohol and chlorhexidine, ethyl chloride spray was used to anesthetize the skin.  The skin was reprepped with alcohol. Injection into the thumb CMC joint with a 25 Ga needle.   0.60ml 1% plain lidocaine  injected with the steroid.  A Band-Aid was applied.  The patient tolerated it well.  I told the patient the steroid will take 3-5 days to take full effect.  Pain may be increased for that time period.      Signed by:  Sephiroth Mcluckie, MD  [email or web address]   Dictated with voice recognition. Please contact me if you feel there are any errors.          [1]   Past Medical History:  Diagnosis Date    Fatty liver     History of gout     Primary hypertension    [2]   Past Surgical History:  Procedure Laterality Date    CARPAL TUNNEL RELEASE Left 2025    endoscopic    ESOPHAGOGASTRODUODENOSCOPY  2023    INGUINAL HERNIA REPAIR      KNEE ARTHROSCOPY W/ DEBRIDEMENT Right     x2    LEFT COLECTOMY      Sigmoid    ROTATOR CUFF REPAIR Right    [3]   Allergies  Allergen Reactions    Ace Inhibitors Angioedema

## 2024-06-11 MED ORDER — AMLODIPINE 5 MG TABLET
5 | ORAL_TABLET | Freq: Every day | ORAL | 1 refills | 30.00000 days | Status: AC
Start: 2024-06-11 — End: ?

## 2024-06-11 MED ORDER — TRIAMTERENE 37.5 MG-HYDROCHLOROTHIAZIDE 25 MG TABLET
37.5-25 | ORAL_TABLET | Freq: Every day | ORAL | 1 refills | 90.00000 days | Status: AC
Start: 2024-06-11 — End: ?

## 2024-06-11 NOTE — Telephone Encounter (Signed)
 Appointment:        Date of patient's next encounter in the current department:  Visit date not found   Date of patient's next encounter with the current provider:  Visit date not found   Date of patient's last encounter in the current department: 02/01/2024      Last BP:   BP Readings from Last 3 Encounters:   02/01/24 138/86   12/21/23 136/86   11/27/23 128/77       Last HGBA1C:   HgbA1C (%)   Date Value   12/21/2022 5.5       Labs  TSH   Date Value Ref Range Status   12/21/2022 1.160 0.450 - 4.500 uIU/mL Final     Comment:     No apparent thyroid disorder. Additional testing not indicated. In  rare instances, Secondary Hypothyroidism as well as Subclinical  Hypothyroidism have been reported in some patients with normal TSH  values.       Lab Results   Component Value Date    CALCIUM 9.3 02/01/2024    ALBUMIN 4.2 11/26/2021    NA 137 02/01/2024    K 4.0 02/01/2024    CO2 21 02/01/2024    CL 101 02/01/2024    BUN 23 02/01/2024    CREATININE 1.19 02/01/2024      AST   Date Value Ref Range Status   02/01/2024 40 0 - 40 IU/L Final   11/01/2021 52 (H) 6 - 42 U/L Final   11/16/2020 35 6 - 42 U/L Final     AST (SGOT)   Date Value Ref Range Status   11/26/2021 53 (H) 15 - 37 IU/L Final     ALT   Date Value Ref Range Status   02/01/2024 53 (H) 0 - 44 IU/L Final   11/01/2021 104 (H) 0 - 55 U/L Final   11/16/2020 64 (H) 0 - 55 U/L Final     ALT (SGPT)   Date Value Ref Range Status   11/26/2021 99 (H) 0 - 55 IU/L Final     Comment:     Lab Director: CURTISTINE ARVIN     No components found for: TBILI  Hemoglobin   Date Value Ref Range Status   11/01/2021 15.1 13.0 - 17.5 g/dL Final     Hgb   Date Value Ref Range Status   02/01/2024 14.4 13.0 - 17.7 g/dL Final     Hematocrit   Date Value Ref Range Status   11/01/2021 42.6 37.0 - 53.0 % Final     Hct   Date Value Ref Range Status   02/01/2024 42.6 37.5 - 51.0 % Final     Platelets   Date Value Ref Range Status   02/01/2024 153 150 - 450 x10E3/uL Final   11/01/2021 241 150 -  400 K/uL Final     No results found for: PTT  No results found for: INR  No results found for: PROTIME  No results found for: PTADJUSTED    Last Urine Drug Screen: on .  Narcotic Medications:    Directives/Controlled Substance Agreement   PMP Appropriate __YES      ___No

## 2024-06-11 NOTE — Telephone Encounter (Signed)
 Appointment:        Date of patient's next encounter in the current department:  06/11/2024   Date of patient's next encounter with the current provider:  Visit date not found   Date of patient's last encounter in the current department: 02/01/2024      Last BP:   BP Readings from Last 3 Encounters:   02/01/24 138/86   12/21/23 136/86   11/27/23 128/77       Last HGBA1C:   HgbA1C (%)   Date Value   12/21/2022 5.5       Labs  TSH   Date Value Ref Range Status   12/21/2022 1.160 0.450 - 4.500 uIU/mL Final     Comment:     No apparent thyroid disorder. Additional testing not indicated. In  rare instances, Secondary Hypothyroidism as well as Subclinical  Hypothyroidism have been reported in some patients with normal TSH  values.       Lab Results   Component Value Date    CALCIUM 9.3 02/01/2024    ALBUMIN 4.2 11/26/2021    NA 137 02/01/2024    K 4.0 02/01/2024    CO2 21 02/01/2024    CL 101 02/01/2024    BUN 23 02/01/2024    CREATININE 1.19 02/01/2024      AST   Date Value Ref Range Status   02/01/2024 40 0 - 40 IU/L Final   11/01/2021 52 (H) 6 - 42 U/L Final   11/16/2020 35 6 - 42 U/L Final     AST (SGOT)   Date Value Ref Range Status   11/26/2021 53 (H) 15 - 37 IU/L Final     ALT   Date Value Ref Range Status   02/01/2024 53 (H) 0 - 44 IU/L Final   11/01/2021 104 (H) 0 - 55 U/L Final   11/16/2020 64 (H) 0 - 55 U/L Final     ALT (SGPT)   Date Value Ref Range Status   11/26/2021 99 (H) 0 - 55 IU/L Final     Comment:     Lab Director: CURTISTINE ARVIN     No components found for: TBILI  Hemoglobin   Date Value Ref Range Status   11/01/2021 15.1 13.0 - 17.5 g/dL Final     Hgb   Date Value Ref Range Status   02/01/2024 14.4 13.0 - 17.7 g/dL Final     Hematocrit   Date Value Ref Range Status   11/01/2021 42.6 37.0 - 53.0 % Final     Hct   Date Value Ref Range Status   02/01/2024 42.6 37.5 - 51.0 % Final     Platelets   Date Value Ref Range Status   02/01/2024 153 150 - 450 x10E3/uL Final   11/01/2021 241 150 - 400 K/uL  Final     No results found for: PTT  No results found for: INR  No results found for: PROTIME  No results found for: PTADJUSTED    Last Urine Drug Screen: on .  Narcotic Medications:    Directives/Controlled Substance Agreement   PMP Appropriate __YES      ___No

## 2024-06-17 ENCOUNTER — Encounter: Payer: PRIVATE HEALTH INSURANCE | Attending: Physical Medicine & Rehabilitation | Primary: Family

## 2024-06-24 ENCOUNTER — Encounter: Payer: PRIVATE HEALTH INSURANCE | Attending: Physical Medicine & Rehabilitation | Primary: Family

## 2024-06-27 ENCOUNTER — Ambulatory Visit: Admit: 2024-06-27 | Discharge: 2024-06-27 | Payer: PRIVATE HEALTH INSURANCE | Attending: Family | Primary: Family

## 2024-06-27 DIAGNOSIS — Z23 Encounter for immunization: Principal | ICD-10-CM

## 2024-06-27 NOTE — Assessment & Plan Note (Signed)
 Discussed with patient options to lose weight.    Recommended Mediterranean diet /1600 calories.  Exercise 150 minutes per week.   Offered referral to nutritionist.

## 2024-06-27 NOTE — Assessment & Plan Note (Signed)
 Blood pressure stable  Bp goal < 140/90  blood pressure will be monitored and maintained at optimal levels per JNC 8 Guidelines.   Meds reviewed for appropriateness.  Discussed with patient lifestyle modification, diet, exercise 150 minutes per week and to continue with current medications as prescribed.   Dicussed with patient to take bp at home and record.  Monitor salt intake

## 2024-06-27 NOTE — Progress Notes (Signed)
 [facility] [address] MAIN  [address]  [address]  [place] [address]  Dept: [phone]  Dept Fax: [phone]     Patient ID: Timothy Morris is a 54 y.o. male who presents for Follow-up.  Patient in today for follow up on blood pressure.   Denies any chest pains, shortness or breath, headaches or dizziness.    Denies any side effects of medications.    States taking medications as prescribed.  #2.  While here patient states continues to have lower back pain and overall general body aches.  Patient does follow-up with Dr. Kapasi for injections to help with arthritic pain.  Patient did have recent MRI and will be following up with orthopedic for evaluation.    Pre-visit  discussed with other staff members to plan and provide appropriate testing  and management for patient.    Subjective  HPI  Current Outpatient Medications   Medication Instructions    amLODIPine  (NORVASC ) 5 mg, oral, Daily    atorvastatin  (LIPITOR ) 40 mg, oral, Daily, To lower cholesterol    EPINEPHrine  (EPIPEN ) 0.3 mg, intramuscular, Once as needed, Call 911 after use.    ketoconazole  (NIZOral ) 2 % cream Apply topically twice daily to affected areas of feet, stop when clear.    metoprolol  succinate XL (TOPROL -XL) 50 mg, oral, Daily, Do not crush or chew    omeprazole  (PRILOSEC ) 20 mg, oral, Daily before breakfast, Do not crush or chew.    triamterene -hydrochlorothiazid (Maxzide -25mg ) 37.5-25 mg tablet 1 tablet, oral, Daily    Wegovy  2.4 mg, subcutaneous, Every 7 days     Allergies[1]  Medical History[2]    Objective  Visit Vitals  BP 130/88 (BP Location: Left arm, Patient Position: Sitting, BP Cuff Size: Large adult)   Pulse 78   Temp 36.8 C (98.2 F) (Temporal)   Ht 1.778 m   Wt 115.2 kg   SpO2 98%   BMI 36.45 kg/m   BSA 2.39 m     Physical Exam  Physical Exam  General:  54 year old obese male in no acute distress  Cardiovascular:      Rate and Rhythm: Normal rate and regular rhythm.   Pulmonary:      Effort: Pulmonary effort is  normal.      Breath sounds: No wheezing.   Abdominal:      Abdomen soft non-tender to touch. Bowel sounds are normal.      No pain with palpation.   Skin:        General: Skin is warm and dry.   Extremites:         No Pedal edema      Assessment / Plan  Timothy Morris was seen today for follow-up.  Primary hypertension  Assessment & Plan:  Blood pressure stable.  Bp goal < 140/90  blood pressure will be monitored and maintained at optimal levels per J[facility] 8 Guidelines.   Meds reviewed for appropriateness.  Discussed with patient lifestyle modification, diet, exercise 150 minutes per week and to continue with current medications as prescribed.   Dicussed with patient to take bp at home and record.  Monitor salt intake    Class 2 severe obesity due to excess calories with serious comorbidity and body mass index (BMI) of 36.0 to 36.9 in adult  Assessment & Plan:  Discussed with patient options to lose weight.    Recommended Mediterranean diet /1600 calories.  Exercise 150 minutes per week.   Offered referral to nutritionist.  Patient Health Questionnaire-2 Score: 0   Interpretation: Negative screening.     Follow-up & Interventions:   - Maintain annual screening - No additional Follow-up required.             [1]   Allergies  Allergen Reactions    Ace Inhibitors Angioedema   [2]   Past Medical History:  Diagnosis Date    Fatty liver     History of gout     Primary hypertension

## 2024-06-27 NOTE — Patient Instructions (Signed)
 Continue medications as prescribed  Call if any problems  Avoidance of salt  Mediterranean diet recommended  Exercise 150 minutes per week  Lose weight for better management of blood pressure.  Recommend eye exam Yearly or per insurance allowance.  Monitor blood pressure at home with proper size cuff, sit down with feet on floor, arm at heart level.   Optimal blood pressure should be <140/90  Follow up in 6 months.   Follow-up with orthopedic.

## 2024-07-08 ENCOUNTER — Encounter: Payer: PRIVATE HEALTH INSURANCE | Attending: Physical Medicine & Rehabilitation | Primary: Family

## 2024-07-08 ENCOUNTER — Emergency Department: Admit: 2024-07-09 | Payer: PRIVATE HEALTH INSURANCE | Primary: Family

## 2024-07-08 DIAGNOSIS — M79645 Pain in left finger(s): Principal | ICD-10-CM

## 2024-07-08 DIAGNOSIS — M7989 Other specified soft tissue disorders: Secondary | ICD-10-CM

## 2024-07-08 NOTE — Telephone Encounter (Signed)
 Appointment:        Date of patient's next encounter in the current department:  Visit date not found   Date of patient's next encounter with the current provider:  Visit date not found   Date of patient's last encounter in the current department: 06/27/2024      Last BP:   BP Readings from Last 3 Encounters:   06/27/24 130/88   02/01/24 138/86   12/21/23 136/86       Last HGBA1C:   HgbA1C (%)   Date Value   12/21/2022 5.5       Labs  TSH   Date Value Ref Range Status   12/21/2022 1.160 0.450 - 4.500 uIU/mL Final     Comment:     No apparent thyroid disorder. Additional testing not indicated. In  rare instances, Secondary Hypothyroidism as well as Subclinical  Hypothyroidism have been reported in some patients with normal TSH  values.       Lab Results   Component Value Date    CALCIUM 9.3 02/01/2024    ALBUMIN 4.2 11/26/2021    NA 137 02/01/2024    K 4.0 02/01/2024    CO2 21 02/01/2024    CL 101 02/01/2024    BUN 23 02/01/2024    CREATININE 1.19 02/01/2024      AST   Date Value Ref Range Status   02/01/2024 40 0 - 40 IU/L Final   11/01/2021 52 (H) 6 - 42 U/L Final   11/16/2020 35 6 - 42 U/L Final     AST (SGOT)   Date Value Ref Range Status   11/26/2021 53 (H) 15 - 37 IU/L Final     ALT   Date Value Ref Range Status   02/01/2024 53 (H) 0 - 44 IU/L Final   11/01/2021 104 (H) 0 - 55 U/L Final   11/16/2020 64 (H) 0 - 55 U/L Final     ALT (SGPT)   Date Value Ref Range Status   11/26/2021 99 (H) 0 - 55 IU/L Final     Comment:     Lab Director: CURTISTINE ARVIN     No components found for: TBILI  Hemoglobin   Date Value Ref Range Status   11/01/2021 15.1 13.0 - 17.5 g/dL Final     Hgb   Date Value Ref Range Status   02/01/2024 14.4 13.0 - 17.7 g/dL Final     Hematocrit   Date Value Ref Range Status   11/01/2021 42.6 37.0 - 53.0 % Final     Hct   Date Value Ref Range Status   02/01/2024 42.6 37.5 - 51.0 % Final     Platelets   Date Value Ref Range Status   02/01/2024 153 150 - 450 x10E3/uL Final   11/01/2021 241 150 -  400 K/uL Final     No results found for: PTT  No results found for: INR  No results found for: PROTIME  No results found for: PTADJUSTED    Last Urine Drug Screen: on .  Narcotic Medications:    Directives/Controlled Substance Agreement   PMP Appropriate __YES      ___No

## 2024-07-08 NOTE — ED Triage Notes (Signed)
 Pt comes to ED reporting L pointer finger pain since last Saturday.  Denies any known injury or trauma.  Reporting finger swollen 2x size of pointer finger on R hand but no edema observed in triage.  No discoloration, warm to touch and +CSM.  Denies any OTC pain meds PTA.  Pain 10/10 and worse with movement.

## 2024-07-08 NOTE — ED Provider Notes (Signed)
 [facility]  [address]  [place] [address]    Chief Complaint   Patient presents with    Hand Pain       HISTORY OF PRESENT ILLNESS  54 year old male with past medical history of hypertension, hyperlipidemia, gout presenting to emergency department with 9 days of worsening pain, swelling and limited range of motion to left index finger. He describes the pain as a throbbing sensation. The pain is worst along the palmar aspect of the digit. Patient denies any trauma to the area including jamming the finger, bug bites or other minor trauma. 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 The patient has a history of gout but his flares have only been in his feet previously. He is not on any chronic medications for gout because he has not had a flare in many years. He does not drink beer and denies any increased consumption of red meat recently. Patient denies fevers, chills, chest pain, shortness of breath, other painful joints, or any other symptoms at this time.           History provided by:  Patient  Language interpreter used: No          PAST MEDICAL HISTORY  Problem List[1]  Medical History[2]  SURGICAL/FAM/SOCIAL HISTORY  Surgical History[3]    Family History[4]    Social History     Tobacco Use    Smoking status: Never    Smokeless tobacco: Never   Vaping Use    Vaping status: Never Used   Substance Use Topics    Alcohol use: Never    Drug use: Yes     Types: Marijuana       MEDICATIONS  Prior to Admission medications   Medication Sig Start Date End Date Taking? Authorizing Provider   amLODIPine  (Norvasc ) 5 mg tablet Take 1 tablet (5 mg) by mouth once daily. 06/11/24   Burnard Jenkins Quarry, NP   atorvastatin  (Lipitor ) 40 mg tablet TAKE 1 TABLET (40 MG) BY MOUTH ONCE DAILY. TO LOWER CHOLESTEROL  Patient not taking: Reported on 05/20/2024 04/20/23   Reyes Ngo, MD   EPINEPHrine  (Epipen ) 0.3 mg/0.3 mL  auto-injector Inject entire contents of epinephrine  auto-injector into thigh. Call 911 after use. 04/27/23   Hosey Morales, PA   ketoconazole  (NIZOral ) 2 % cream Apply topically twice daily to affected areas of feet, stop when clear. 05/20/24   Koren Passer, MD   metoprolol  succinate XL (Toprol -XL) 50 mg 24 hr tablet Take 1 tablet (50 mg) by mouth once daily. Do not crush or chew 05/13/24   Debby Genre, NP   omeprazole  (PriLOSEC ) 20 mg DR capsule Take 1 capsule (20 mg) by mouth before breakfast. Do not crush or chew. 03/14/24   Debby Genre, NP   semaglutide , weight loss, (Wegovy ) 2.4 mg/0.75 mL pen injector INJECT 2.4 MG SUBCUTANEOUSLY ONE TIME PER WEEK 07/08/24   Debby Genre, NP   triamterene -hydrochlorothiazid (Maxzide -25mg ) 37.5-25 mg tablet Take 1 tablet by mouth once daily. 06/11/24   Burnard Jenkins Quarry, NP   semaglutide , weight loss, (Wegovy ) 2.4 mg/0.75 mL pen injector Inject 2.4 mg under the skin every 7 (seven) days. 04/11/24 07/08/24  Debby Genre, NP      Allergies[5]  REVIEW OF SYSTEMS  Review of Systems   Constitutional:  Negative for chills and fever.   Musculoskeletal:  Positive for joint swelling and myalgias.     PHYSICAL  EXAM  ED Triage Vitals [07/08/24 2030]   Temp Pulse Resp BP   36.3 C (97.4 F) 69 16 (!) 165/88      SpO2 Temp Source Heart Rate Source Patient Position   98 % Temporal Monitor Sitting      BP Location FiO2 (%)     Left arm --       Physical Exam  Constitutional:       Appearance: Normal appearance.   HENT:      Head: Normocephalic.      Nose: Nose normal.   Eyes:      Conjunctiva/sclera: Conjunctivae normal.   Cardiovascular:      Rate and Rhythm: Normal rate and regular rhythm.      Pulses: Normal pulses.   Pulmonary:      Effort: Pulmonary effort is normal.   Musculoskeletal:         General: Swelling and tenderness present.      Cervical back: Normal range of motion.      Comments: Swelling and tenderness to palpation over left second proximal phalanx with overlying warmth.  Minimal erythema.   Tenderness over PIP  No tenderness or swelling to distal phalanx, DIP, or MCP   Very limited flexion of entire digit secondary to pain   No pain with passive or active extension of the digit   No felon or paronychia  Sensation intact to light touch   Distal pulses intact   Normal capillary refill    See clinical photos below      Skin:     General: Skin is warm and dry.      Capillary Refill: Capillary refill takes less than 2 seconds.   Neurological:      General: No focal deficit present.      Mental Status: He is alert and oriented to person, place, and time.   Psychiatric:         Mood and Affect: Mood normal.         Behavior: Behavior normal.                               TESTING RESULTS  PROCEDURES  Labs Reviewed   COMPREHENSIVE METABOLIC PANEL - Abnormal       Result Value    Sodium 138      Potassium 3.6      Chloride 108      CO2 (Bicarbonate) 24      Anion Gap 6      BUN 20      Creatinine 1.08      eGFRcr 81      Glucose 117      Fasting? Unknown      Calcium 9.3      AST 44 (*)     ALT 82 (*)     Alkaline phosphatase 73      Protein, total 7.8      Albumin 3.5      Bilirubin, total 0.3     MAGNESIUM - Normal    Magnesium 2.0     URIC ACID - Normal    Uric Acid 7.3     C-REACTIVE PROTEIN - Normal    CRP 0.35     CBC W/DIFF    Narrative:     The following orders were created for panel order CBC and differential.  Procedure  Abnormality         Status                     ---------                               -----------         ------                     CBC w/ Differential[[id number]]                              Final result                 Please view results for these tests on the individual orders.   CBC WITH DIFFERENTIAL    WBC 5.7      RBC 4.75      Hemoglobin 15.2      Hematocrit 42.9      MCV 90.3      MCH 32.0      MCHC 35.4      RDW-CV 13.6      RDW-SD 45.5      Platelets 169      MPV 10.5      Neutrophil % 53.5      Lymphocyte % 32.4       Monocytes % 12.1      Eosinophils % 1.1      Basophils % 0.7      Immature Granulocytes % 0.2      NRBC % 0.0      Neutrophils Absolute 3.04      Lymphocytes Absolute 1.84      Monocytes Absolute 0.69      Eosinophils Absolute 0.06      Basophils Absolute 0.04      Immature Granulocytes Absolute 0.01      NRBC Absolute 0.00       XR HAND LEFT 3+ VIEWS   Final Result   No acute osseous findings.   Mild degenerative change.      Duwaine Moats 07/08/2024 9:26 PM          Procedures  ED COURSE/MDM  Diagnoses as of 07/09/24 0044   Finger pain, left     ED Course & MDM   Medical Decision Making  54 year old male with past medical history of hypertension, hyperlipidemia, gout presents to emergency department with 9 days of atraumatic pain, swelling and limited range of motion to left index finger.  Vital signs notable for patient mildly hypertensive at 165/88, otherwise unremarkable.  Patient is afebrile.    IV access was established and following workup obtained.  CBC with differential generally unremarkable.  No leukocytosis.  Magnesium 2.0.  CRP 0.35.  CMP with mildly elevated AST at 44 and ALT 82, otherwise generally unremarkable.  Uric acid 7.3.  Differential diagnosis for atraumatic finger pain includes gout, pseudogout, osteoarthritis. Low suspicion for infectious flexor tenosynovitis as there is not fusiform swelling of the entire digit, there is no pain on passive or active extension, and the finger is held in extension rather than flexion. Discussed case with attending physician, Dr. Keven, regarding management.  Orthopedics consulted. Spoke with orthopedics PA, Waddell Cornet who recommended treatment with ice and NSAIDs and follow up in hand clinic. The hand clinic will contact the patient. Patient's phone number confirmed in chart.  Patient declined NSAIDs here reporting he has a cortisone injection scheduled tomorrow for back pain and he was told to avoid NSAIDs prior to the injection. Recommended he start  NSAIDs after his appointment tomorrow and use ice in the meantime to decrease inflammation.     Patient is safe for discharge at this time. The patient was informed of all results and given the opportunity to ask questions. All questions answered. The patient is in agreement with plan. Patient discharged home with orthopedics hand specialist and PCP follow up.           Problems Addressed:  Finger pain, left: acute illness or injury          DIAGNOSIS  Problem List Items Addressed This Visit    None  Visit Diagnoses         Finger pain, left    -  Primary          CONDITION  Fair    DISPOSITION  Discharged     Patient informed of evaluation results. Hospitalization is not necessary as patient is safe for discharge and appropriate for outpatient management. I have answered all questions.  Patient told to inform primary care doctor today of this ED visit and to obtain follow-up visit or return if unable to see primary care doctor.  Patient told to seek medical attention immediately for any further concerns, worsening of condition, or not getting better in the expected time thought to. Patient has been discharged.             [1]   Patient Active Problem List  Diagnosis    Primary hypertension    Hyperlipidemia    Fatty liver    Degenerative disc disease, lumbar    Class 2 severe obesity due to excess calories with serious comorbidity and body mass index (BMI) of 36.0 to 36.9 in adult    Lumbar radiculopathy    Lumbosacral spondylosis without myelopathy    Cervical radiculopathy    History of gout    Abnormal MRI, lumbar spine    Asymptomatic varicose veins of right lower extremity    Vertebrogenic low back pain   [2]   Past Medical History:  Diagnosis Date    Fatty liver     History of gout     Primary hypertension    [3]   Past Surgical History:  Procedure Laterality Date    CARPAL TUNNEL RELEASE Left 2025    endoscopic    ESOPHAGOGASTRODUODENOSCOPY  2023    INGUINAL HERNIA REPAIR      KNEE ARTHROSCOPY W/ DEBRIDEMENT  Right     x2    LEFT COLECTOMY      Sigmoid    ROTATOR CUFF REPAIR Right    [4]   Family History  Problem Relation Name Age of Onset    Breast cancer Mother      No Known Problems Sister     [5]   Allergies  Allergen Reactions    Ace Inhibitors Angioedema        Lucie Piety, [place]  07/09/24 [phone]

## 2024-07-08 NOTE — ED Progress Note (Signed)
 MEDICAL SCREENING EXAM - PROVIDER IN TRIAGE    Brief HPI: 54 year old male with past medical history of hypertension, gout, osteoarthritis presents to emergency department with left pointer finger pain x 1 week.  Patient denies associated trauma or injury.  Reports that swelling and pain has worsened over the past day, limited range of motion with finger flexion.  No discoloration, warmth to touch, open injury, overlying skin changes or rash appreciated.  The left upper extremity is neurovascularly intact.  Denies alcohol use, fever/chills, IV drug use, history of Lyme disease, risk of STI, history of rheumatoid arthritis.  Previous gout flares affecting the right foot.    Exam:  Constitutional: Vital signs reviewed. Well-nourished.  Respiratory: Nonlabored. Speaking in full clear sentences.   Skin: Normal color. No rash.  Neuro: Alert. Normal gross motor.  Psych. Normal affect    Tests ordered:   Orders Placed This Encounter   Procedures    XR HAND LEFT 3+ VIEWS    Comprehensive metabolic panel    CBC and differential    Magnesium    Uric acid    C-reactive protein (Regular)    CBC w/ Differential       Disposition: Extension    Observation Status: Patient placed in ED observation pending basic labs and x-ray of the left hand to rule out acute bony abnormalities versus inflammatory versus infectious pathology    Patient was advised not to leave the emergency department until further evaluation and work-up is complete.     Ronnald Long, [place]  07/08/24 2047

## 2024-07-08 NOTE — Discharge Instructions (Addendum)
 You were evaluated in the Emergency Department today for your left index finger pain. Your evaluation, including X-rays of your left hand, did not show signs of fractures or other acute abnormalities which require further intervention at this time.    Orthopedics will call you to schedule an appointment with a hand specialist. In the meantime you can take 400mg  ibuprofen  every 6 hours for pain and to reduce inflammation. If needed, you can also take Tylenol 650 mg every 6 hours and alternate these medications so that you take one medication every 3 hours.     Please follow up with your primary care provider and orthopedics. Please return to the Emergency Department if you experience worsening pain, swelling, redness, numbness, tingling, weakness or any other concerning symptoms.

## 2024-07-08 NOTE — ED Notes (Signed)
 D/c instructions were given to pt, all questions were answered.  Walked out with a steady gait and All belongings taken       Franky Ruth, RN  07/08/24 2326

## 2024-07-08 NOTE — ED Provider Notes (Incomplete)
 [facility]  [address]  [place] [address]    Chief Complaint   Patient presents with    Hand Pain       HISTORY OF PRESENT ILLNESS  54 year old male with past medical history of hypertension, hyperlipidemia, gout presents to emergency department with 9 days of worsening pain, swelling and limited range of motion to left index finger. Patient denies any trauma to the area including bug bite or other minor trauma.     The patient works as a counsellor that          History provided by:  Patient  Language interpreter used: No          PAST MEDICAL HISTORY  Problem List[1]  Medical History[2]  SURGICAL/FAM/SOCIAL HISTORY  Surgical History[3]    Family History[4]    Social History     Tobacco Use    Smoking status: Never    Smokeless tobacco: Never   Vaping Use    Vaping status: Never Used   Substance Use Topics    Alcohol use: Never    Drug use: Yes     Types: Marijuana       MEDICATIONS  Prior to Admission medications   Medication Sig Start Date End Date Taking? Authorizing Provider   amLODIPine  (Norvasc ) 5 mg tablet Take 1 tablet (5 mg) by mouth once daily. 06/11/24   Burnard Jenkins Quarry, NP   atorvastatin  (Lipitor ) 40 mg tablet TAKE 1 TABLET (40 MG) BY MOUTH ONCE DAILY. TO LOWER CHOLESTEROL  Patient not taking: Reported on 05/20/2024 04/20/23   Reyes Ngo, MD   EPINEPHrine  (Epipen ) 0.3 mg/0.3 mL auto-injector Inject entire contents of epinephrine  auto-injector into thigh. Call 911 after use. 04/27/23   Hosey Morales, PA   ketoconazole  (NIZOral ) 2 % cream Apply topically twice daily to affected areas of feet, stop when clear. 05/20/24   Koren Passer, MD   metoprolol  succinate XL (Toprol -XL) 50 mg 24 hr tablet Take 1 tablet (50 mg) by mouth once daily. Do not crush or chew 05/13/24   Debby Genre, NP   omeprazole  (PriLOSEC ) 20 mg DR capsule Take 1 capsule (20 mg) by mouth before breakfast. Do not crush or chew. 03/14/24   Debby Genre, NP   semaglutide , weight loss,  (Wegovy ) 2.4 mg/0.75 mL pen injector INJECT 2.4 MG SUBCUTANEOUSLY ONE TIME PER WEEK 07/08/24   Debby Genre, NP   triamterene -hydrochlorothiazid (Maxzide -25mg ) 37.5-25 mg tablet Take 1 tablet by mouth once daily. 06/11/24   Burnard Jenkins Quarry, NP   semaglutide , weight loss, (Wegovy ) 2.4 mg/0.75 mL pen injector Inject 2.4 mg under the skin every 7 (seven) days. 04/11/24 07/08/24  Debby Genre, NP      Allergies[5]  REVIEW OF SYSTEMS  Review of Systems   Constitutional:  Negative for chills and fever.   Musculoskeletal:  Positive for joint swelling and myalgias.     PHYSICAL EXAM  ED Triage Vitals [07/08/24 2030]   Temp Pulse Resp BP   36.3 C (97.4 F) 69 16 (!) 165/88      SpO2 Temp Source Heart Rate Source Patient Position   98 % Temporal Monitor Sitting      BP Location FiO2 (%)     Left arm --       Physical Exam              TESTING RESULTS  PROCEDURES  Labs Reviewed   COMPREHENSIVE METABOLIC PANEL - Abnormal       Result  Value    Sodium 138      Potassium 3.6      Chloride 108      CO2 (Bicarbonate) 24      Anion Gap 6      BUN 20      Creatinine 1.08      eGFRcr 81      Glucose 117      Fasting? Unknown      Calcium 9.3      AST 44 (*)     ALT 82 (*)     Alkaline phosphatase 73      Protein, total 7.8      Albumin 3.5      Bilirubin, total 0.3     MAGNESIUM - Normal    Magnesium 2.0     URIC ACID - Normal    Uric Acid 7.3     C-REACTIVE PROTEIN - Normal    CRP 0.35     CBC W/DIFF    Narrative:     The following orders were created for panel order CBC and differential.  Procedure                               Abnormality         Status                     ---------                               -----------         ------                     CBC w/ Differential[[id number]]                              Final result                 Please view results for these tests on the individual orders.   CBC WITH DIFFERENTIAL    WBC 5.7      RBC 4.75      Hemoglobin 15.2      Hematocrit 42.9      MCV 90.3      MCH 32.0       MCHC 35.4      RDW-CV 13.6      RDW-SD 45.5      Platelets 169      MPV 10.5      Neutrophil % 53.5      Lymphocyte % 32.4      Monocytes % 12.1      Eosinophils % 1.1      Basophils % 0.7      Immature Granulocytes % 0.2      NRBC % 0.0      Neutrophils Absolute 3.04      Lymphocytes Absolute 1.84      Monocytes Absolute 0.69      Eosinophils Absolute 0.06      Basophils Absolute 0.04      Immature Granulocytes Absolute 0.01      NRBC Absolute 0.00       XR HAND LEFT 3+ VIEWS   Final Result   No acute osseous findings.   Mild degenerative change.      Duwaine Moats 07/08/2024 9:26  PM          Procedures  ED COURSE/MDM  Diagnoses as of 07/08/24 2319   Finger pain, left     ED Course & MDM   Medical Decision Making  54 year old male with past medical history of hypertension, hyperlipidemia, gout presents to emergency department with 9 days of atraumatic pain, swelling and limited range of motion to left index finger.  Vital signs notable for patient mildly hypertensive at 165/88.  Otherwise unremarkable.  Patient is afebrile.    IV access was established and following workup obtained.  CBC with differential generally unremarkable.  No leukocytosis.  Magnesium 2.0.  CRP 0.35.  CMP with mildly elevated AST at 44 and ALT 82, otherwise generally unremarkable.  Uric acid 7.3.  Discussed case with attending physician, Dr. Keven, regarding management.  Orthopedics consulted. Spoke with orthopedics PA, Waddell Cornet who recommended treatment with ice and NSAIDs and follow up in hand clinic. Patient declined NSAIDs here reporting he has a cortisone injection scheduled tomorrow for back pain. Recommended he start NSAIDs after his appointment tomorrow and use ice in the meantime to decrease inflammation.             Problems Addressed:  Finger pain, left: acute illness or injury      Differential diagnosis includes gout, pseudogout,     DIAGNOSIS  Problem List Items Addressed This Visit    None  Visit Diagnoses          Finger pain, left    -  Primary          CONDITION  Fair    DISPOSITION  Discharged     Patient informed of evaluation results. Hospitalization is not necessary as patient is safe for discharge and appropriate for outpatient management. I have answered all questions.  Patient told to inform primary care doctor today of this ED visit and to obtain follow-up visit or return if unable to see primary care doctor.  Patient told to seek medical attention immediately for any further concerns, worsening of condition, or not getting better in the expected time thought to. Patient has been discharged.                 [1]  Patient Active Problem List  Diagnosis    Primary hypertension    Hyperlipidemia    Fatty liver    Degenerative disc disease, lumbar    Class 2 severe obesity due to excess calories with serious comorbidity and body mass index (BMI) of 36.0 to 36.9 in adult    Lumbar radiculopathy    Lumbosacral spondylosis without myelopathy    Cervical radiculopathy    History of gout    Abnormal MRI, lumbar spine    Asymptomatic varicose veins of right lower extremity    Vertebrogenic low back pain   [2]  Past Medical History:  Diagnosis Date    Fatty liver     History of gout     Primary hypertension    [3]  Past Surgical History:  Procedure Laterality Date    CARPAL TUNNEL RELEASE Left 2025    endoscopic    ESOPHAGOGASTRODUODENOSCOPY  2023    INGUINAL HERNIA REPAIR      KNEE ARTHROSCOPY W/ DEBRIDEMENT Right     x2    LEFT COLECTOMY      Sigmoid    ROTATOR CUFF REPAIR Right    [4]  Family History  Problem Relation Name Age of Onset    Breast cancer Mother  No Known Problems Sister     [5]  Allergies  Allergen Reactions    Ace Inhibitors Angioedema

## 2024-07-09 ENCOUNTER — Ambulatory Visit
Admit: 2024-07-09 | Discharge: 2024-07-09 | Payer: PRIVATE HEALTH INSURANCE | Attending: Physical Medicine & Rehabilitation | Primary: Family

## 2024-07-09 ENCOUNTER — Inpatient Hospital Stay
Admit: 2024-07-09 | Discharge: 2024-07-09 | Disposition: A | Discharge status: Home / Self Care | Payer: PRIVATE HEALTH INSURANCE | Arrived: VH

## 2024-07-09 ENCOUNTER — Ambulatory Visit
Admit: 2024-07-09 | Discharge: 2024-07-09 | Payer: PRIVATE HEALTH INSURANCE | Attending: Registered Nurse | Primary: Family

## 2024-07-09 LAB — COMPREHENSIVE METABOLIC PANEL
ALT: 82 U/L — ABNORMAL HIGH (ref 0–55)
AST: 44 U/L — ABNORMAL HIGH (ref 6–42)
Albumin: 3.5 g/dL (ref 3.2–5.0)
Alkaline phosphatase: 73 U/L (ref 30–130)
Anion Gap: 6 mmol/L (ref 3–14)
BUN: 20 mg/dL (ref 6–24)
Bilirubin, total: 0.3 mg/dL (ref 0.2–1.2)
CO2 (Bicarbonate): 24 mmol/L (ref 20–32)
Calcium: 9.3 mg/dL (ref 8.5–10.5)
Chloride: 108 mmol/L (ref 98–110)
Creatinine: 1.08 mg/dL (ref 0.55–1.30)
Glucose: 117 mg/dL (ref 70–139)
Potassium: 3.6 mmol/L (ref 3.6–5.2)
Protein, total: 7.8 g/dL (ref 6.0–8.4)
Sodium: 138 mmol/L (ref 135–146)
eGFRcr: 81 mL/min/{1.73_m2} (ref >=60)

## 2024-07-09 LAB — CBC WITH DIFFERENTIAL
Basophils %: 0.7 %
Basophils Absolute: 0.04 10*3/uL (ref 0.00–0.22)
Eosinophils %: 1.1 %
Eosinophils Absolute: 0.06 10*3/uL (ref 0.00–0.50)
Hematocrit: 42.9 % (ref 37.0–53.0)
Hemoglobin: 15.2 g/dL (ref 13.0–17.5)
Immature Granulocytes %: 0.2 %
Immature Granulocytes Absolute: 0.01 10*3/uL (ref 0.00–0.10)
Lymphocyte %: 32.4 %
Lymphocytes Absolute: 1.84 10*3/uL (ref 0.70–4.00)
MCH: 32 pg (ref 26.0–34.0)
MCHC: 35.4 g/dL (ref 31.0–37.0)
MCV: 90.3 fL (ref 80.0–100.0)
MPV: 10.5 fL (ref 9.1–12.4)
Monocytes %: 12.1 %
Monocytes Absolute: 0.69 10*3/uL (ref 0.38–0.83)
NRBC %: 0 % (ref 0.0–0.0)
NRBC Absolute: 0 10*3/uL (ref 0.00–2.00)
Neutrophil %: 53.5 %
Neutrophils Absolute: 3.04 10*3/uL (ref 1.50–7.95)
Platelets: 169 10*3/uL (ref 150–400)
RBC: 4.75 M/uL (ref 4.20–5.90)
RDW-CV: 13.6 % (ref 11.5–14.5)
RDW-SD: 45.5 fL (ref 35.0–51.0)
WBC: 5.7 10*3/uL (ref 4.0–11.0)

## 2024-07-09 LAB — MAGNESIUM: Magnesium: 2 mg/dL (ref 1.6–2.6)

## 2024-07-09 LAB — C-REACTIVE PROTEIN: CRP: 0.35 mg/dL (ref 0.29–0.80)

## 2024-07-09 LAB — URIC ACID: Uric Acid: 7.3 mg/dL (ref 2.6–8.0)

## 2024-07-09 MED ORDER — PREDNISONE 50 MG TABLET
50 | ORAL_TABLET | Freq: Every day | ORAL | 0 refills | 18.00000 days | Status: AC
Start: 2024-07-09 — End: ?

## 2024-07-09 MED ORDER — SODIUM CHLORIDE 0.9 % INJECTION SOLUTION
Freq: Once | INTRAMUSCULAR | Status: AC | PRN
Start: 2024-07-09 — End: 2024-07-09
  Administered 2024-07-09: 19:00:00 3 mL via INTRAMUSCULAR

## 2024-07-09 MED ORDER — DEXAMETHASONE SODIUM PHOSPHATE 4 MG/ML INJECTION SOLUTION
4 | Freq: Once | INTRAMUSCULAR | Status: AC | PRN
Start: 2024-07-09 — End: 2024-07-09
  Administered 2024-07-09: 19:00:00 12 mg via EPIDURAL

## 2024-07-09 MED ORDER — IOHEXOL 240 MG IODINE/ML INTRAVENOUS SOLUTION
240 | Freq: Once | INTRAVENOUS | Status: AC | PRN
Start: 2024-07-09 — End: 2024-07-09
  Administered 2024-07-09: 19:00:00 3 mL via EPIDURAL

## 2024-07-09 MED ORDER — LIDOCAINE (PF) 10 MG/ML (1 %) INJECTION SOLUTION
10 | Freq: Once | INTRAMUSCULAR | Status: AC | PRN
Start: 2024-07-09 — End: 2024-07-09
  Administered 2024-07-09: 19:00:00 5 mL via INTRAMUSCULAR

## 2024-07-09 NOTE — Telephone Encounter (Signed)
 Patient calling in to be seen by NP Salines    Patient reports going to [facility] 07/08/24 for swollen left pointer with pain    Patient has ortho appointment next week but cannot wait that long.     Offered patient appointment for 1/28 patient declined as they are working    Patient looking for pain medication    Callback:  [phone]

## 2024-07-09 NOTE — Progress Notes (Signed)
 [id number]  [place], [place] [address]    [address]  [place], [place] [address]    Phone: [phone]  Fax:      [phone]  E-mail: [email or web address]        PATIENT NAME: Timothy Morris   PATIENT DOB:  1971-06-06   PCP:   DEBBY GENRE, NP  PROCEDURALIST: Lucendia CLEMENTEEN Knows, MD  PROCEDURE DATE: 07/09/2024    Vitals:    07/09/24 1336   BP: (!) 144/88        PROCEDURE NOTE  LEFT L3-4 and L4-5 Lumbar Transforaminal Steroid Injections    Performed by: Lucendia Knows, MD  Authorized by: Lucendia Knows, MD    Universal Protocol:   Consent: Verbal consent obtained. Written consent obtained.  Consent given by: patient  Patient understanding: patient states understanding of the procedure being performed  Patient consent: the patient's understanding of the procedure matches consent given  Procedure consent: procedure consent matches procedure scheduled  Relevant documents: relevant documents present and verified  Test results: test results available and properly labeled  Site marked: the operative site was marked  Imaging studies: imaging studies available  Patient identity confirmed: verbally with patient and provided demographic data  Time out: Immediately prior to procedure a time out was called to verify the correct patient, procedure, equipment, support staff and site/side marked as required.  Location:     Left lumbar:  L3-4 and L4-5    Lateral oblique angle: left    Needle:     Needle type:  Spinal    Number of needles:  2    Needle gauge:  22 G    Needle length (in):  5.0  Procedure:     Needle position inspection: anteroposterior, lateral and ipsilateral oblique      Contrast Spread: nerve root sheath and epidural space    Medication:   12 mg dexAMETHasone  4 mg/mL; 3 mL iohexol  240 mg iodine /mL; 3 mL sodium chloride  0.9 %; 5 mL lidocaine  PF 10 mg/mL (1 %)  Comments:  LUMBAR left L3-4 and left L4-5 lumbar transforaminal epidural steroid injections    The procedure was explained. Risks,  benefits and alternatives were explained. A consent form was signed. The patient was placed prone on the exam table. The area overlying the lumbosacral spine was exposed and prepared using a  chlorhexidine solution. This area was then allowed to dry and draped. The entry points overlying the infraneural L3-4 and supraneural L4-5 transforaminal areas was identified using both anatomic landmarks and fluoroscopy. A skin wheal was raised at each entry point location using preservative free 1% lidocaine  without epinephrine  delivered via a 1.5 27g skin needle. The needle was then inserted and directed toward the transforaminal area. As the needle was withdrawn from each location, 1 mL of preservative free 1% lidocaine  without epinephrine  was injected. A 22 g 5 Quincke tipped spinal needle was then inserted and directed toward the respective neuroforamena using intermittent fluoroscopy in AP, lateral, and 20 degree ipsilateral oblique views. The stylus was removed and the location was confirmed through injection of 0.5 mL of Omnipaque  240 contrast. AP, lateral, and 20 degree ipsilateral oblique views were saved, as appropriate. Then, 1.5 mL of normal saline mixed with 1.5 mL of 4 mg/mL dexamethasone  was injected transforaminally into the respective epidural spaces. The needles were then withdrawn. The area was cleansed a sterile adhesive bandage was placed overlying the entry sites. All aspirates were negative. none. The patient ambulated to  the recovery area without difficulty.            Lucendia CLEMENTEEN Knows, MD, [email or web address]  Physical Medicine & Rehabilitation, [facility]  Assistant Clinical Professor, [facility]

## 2024-07-09 NOTE — Assessment & Plan Note (Signed)
 Unclear xray at hospital mostly normal  Labs normal  Prednisone  x 5 days  F/u with hand MD

## 2024-07-09 NOTE — Telephone Encounter (Signed)
 Patient is Scheduled to see kelly this afternoon at 2:30 pm

## 2024-07-09 NOTE — Progress Notes (Signed)
 This visit note was drafted with the help of artificial intelligence. I obtained consent to record the visit from all participants for this purpose.    BURNARD QUARRY, NP       History of Present Illness  The patient presents for evaluation of a swollen finger.    The swelling began two Saturdays ago, and discomfort and pain were noted upon waking up on Sunday. The swelling impeded the ability to flex the finger. Despite the pain intensifying on Monday and Tuesday, he returned to work on Wednesday. 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 He observed an improvement in the condition from Wednesday to Friday, but the symptoms recurred over the weekend, prompting him to seek medical attention at a hospital last night. An x-ray and blood test were performed, both of which returned normal results. He reports no joint pain or gout. He was unable to receive any medication at the hospital due to a recent back injection and was advised against taking blood thinners for a few days. He has scheduled an appointment with a hand specialist. He believes the initial improvement in his condition was due to reduced use of the finger, but the symptoms have since worsened. He experiences more pain on the side of the finger than the knuckle. He abstains from alcohol and consumes red meat sparingly. He received a cortisone injection in his hand in 05/2024 and underwent carpal tunnel surgery in 07/2023. He has noticed a gradual weakening of his hand.    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 He just had an injection in his back. He has had several injections in his back.    Social History:  Occupation: Cook  Diet: Consumes red meat sparingly  Alcohol: Abstains from alcohol    PAST SURGICAL HISTORY:  Carpal tunnel surgery in 07/2023       Surgical History[1]    Family History[2]    Social History[3]    Physical Exam  Constitutional:       Appearance: Normal appearance.    Musculoskeletal:      Comments: Left index finger with inflammation, and mild ecchymosis  TTP and can reproduce pain with bending at either joint, no erythema, good pulses and hands and fingers warm, normal sensation   Neurological:      Mental Status: He is alert.          Finger pain, left  Unclear xray at hospital mostly normal  Labs normal  Prednisone  x 5 days  F/u with hand MD      Results  Labs   - Uric acid level: Normal    Imaging   - X-ray of the finger: No fracture or signs of gout                                            [1]   Past Surgical History:  Procedure Laterality Date    CARPAL TUNNEL RELEASE Left 2025    endoscopic    ESOPHAGOGASTRODUODENOSCOPY  2023    INGUINAL HERNIA REPAIR      KNEE ARTHROSCOPY W/ DEBRIDEMENT Right     x2    LEFT COLECTOMY      Sigmoid    ROTATOR CUFF REPAIR Right    [2]   Family History  Problem Relation  Name Age of Onset    Breast cancer Mother      No Known Problems Sister     [3]   Social History  Socioeconomic History    Marital status: Divorced     Spouse name: Not on file    Number of children: 2    Years of education: Not on file    Highest education level: Not on file   Occupational History    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   Tobacco Use    Smoking status: Never    Smokeless tobacco: Never   Vaping Use    Vaping status: Never Used   Substance and Sexual Activity    Alcohol use: Never    Drug use: Yes     Types: Marijuana    Sexual activity: Not on file   Other Topics Concern    Not on file   Social History Narrative    Not on file     Social Determinants of Health     Financial Resource Strain: Patient Declined (07/08/2024)    Overall Financial Resource Strain (CARDIA)     Difficulty of Paying Living Expenses: Patient declined   Food Insecurity: Unknown (07/08/2024)    Hunger Vital Sign     Worried About Running Out of Food in the Last Year: Patient declined     Ran Out of Food in the Last Year: Not on file    Transportation Needs: Patient Declined (07/08/2024)    PRAPARE - Therapist, Art (Medical): Patient declined     Lack of Transportation (Non-Medical): Patient declined   Physical Activity: Not on file   Stress: Not on file   Social Connections: Not on file   Intimate Partner Violence: Not on file   Housing Stability: Unknown (07/08/2024)    Housing Stability Vital Sign     Unable to Pay for Housing in the Last Year: Not on file     Number of Times Moved in the Last Year: Not on file     Homeless in the Last Year: Patient declined

## 2024-07-10 MED ORDER — ATORVASTATIN 40 MG TABLET
40 | ORAL_TABLET | Freq: Every day | ORAL | 1 refills | 90.00000 days | Status: AC
Start: 2024-07-10 — End: ?

## 2024-07-10 NOTE — Telephone Encounter (Signed)
 Appointment:        Date of patient's next encounter in the current department:  Visit date not found   Date of patient's next encounter with the current provider:  Visit date not found   Date of patient's last encounter in the current department: 07/09/2024      Last BP:   BP Readings from Last 3 Encounters:   07/09/24 138/82   07/09/24 (!) 144/88   07/08/24 (!) 165/88       Last HGBA1C:   HgbA1C (%)   Date Value   12/21/2022 5.5       Labs  TSH   Date Value Ref Range Status   12/21/2022 1.160 0.450 - 4.500 uIU/mL Final     Comment:     No apparent thyroid disorder. Additional testing not indicated. In  rare instances, Secondary Hypothyroidism as well as Subclinical  Hypothyroidism have been reported in some patients with normal TSH  values.       Lab Results   Component Value Date    CALCIUM 9.3 07/08/2024    ALBUMIN 3.5 07/08/2024    NA 138 07/08/2024    K 3.6 07/08/2024    CO2 24 07/08/2024    CL 108 07/08/2024    BUN 20 07/08/2024    CREATININE 1.08 07/08/2024      AST   Date Value Ref Range Status   07/08/2024 44 (H) 6 - 42 U/L Final   02/01/2024 40 0 - 40 IU/L Final   11/01/2021 52 (H) 6 - 42 U/L Final     AST (SGOT)   Date Value Ref Range Status   11/26/2021 53 (H) 15 - 37 IU/L Final     ALT   Date Value Ref Range Status   07/08/2024 82 (H) 0 - 55 U/L Final   02/01/2024 53 (H) 0 - 44 IU/L Final   11/01/2021 104 (H) 0 - 55 U/L Final     ALT (SGPT)   Date Value Ref Range Status   11/26/2021 99 (H) 0 - 55 IU/L Final     Comment:     Lab Director: CURTISTINE ARVIN     No components found for: TBILI  Hemoglobin   Date Value Ref Range Status   07/08/2024 15.2 13.0 - 17.5 g/dL Final     Hematocrit   Date Value Ref Range Status   07/08/2024 42.9 37.0 - 53.0 % Final     Platelets   Date Value Ref Range Status   07/08/2024 169 150 - 400 K/uL Final     No results found for: PTT  No results found for: INR  No results found for: PROTIME  No results found for: PTADJUSTED    Last Urine Drug Screen: on .  Narcotic  Medications:    Directives/Controlled Substance Agreement   PMP Appropriate __YES      ___No

## 2024-07-12 ENCOUNTER — Ambulatory Visit: Admit: 2024-07-12 | Discharge: 2024-07-12 | Payer: PRIVATE HEALTH INSURANCE | Attending: Hand Surgery | Primary: Family

## 2024-07-12 DIAGNOSIS — M79645 Pain in left finger(s): Principal | ICD-10-CM

## 2024-07-12 NOTE — Progress Notes (Signed)
 [address]  [place], [place] [address]    [address]  [place], [place] [address]    Phone: [phone]  Fax:      [phone]  E-mail: [email or web address]      HPI  Timothy Morris is a 54 y.o. male with a chief complaint of left index finger pain and swelling without an injury. He went to the [facility] ER and they checked his uric acid levels which were normal.    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   His PCP put him on prednisone  which is helping his pain and swelling. He feels over 50% better.     A left thumb [facility] injection last month helped a lot.     Hand dominance: right   Duration of symptoms: 2 weeks  Aggravating factors: Making a fist, pressing on the index finger,   Pain scale: 8/10  Numbness or tingling: no  Pain medications: no  Immobilization: no splint  Steroid injections for this problem: no  Occupational or Physical Therapy: no  Prior upper extremity fractures:  no  Prior upper extremity surgery: [facility] Feb '25  Occupation: Cook      Medical History[1]    Surgical History[2]    Social History     Occupational History    Occupation: Short Order [employer]ment/[employer]ile [employer] Part Time     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   Tobacco Use    Smoking status: Never    Smokeless tobacco: Never   Vaping Use    Vaping status: Never Used   Substance and Sexual Activity    Alcohol use: Never    Drug use: Yes     Types: Marijuana    Sexual activity: Not on file       Current Outpatient Medications   Medication Instructions    amLODIPine  (NORVASC ) 5 mg, oral, Daily    atorvastatin  (LIPITOR ) 40 mg, oral, Daily, To lower cholesterol    EPINEPHrine  (EPIPEN ) 0.3 mg, intramuscular, Once as needed, Call 911 after use.    ketoconazole  (NIZOral ) 2 % cream Apply topically twice daily to affected areas of feet, stop when clear.    metoprolol  succinate XL (TOPROL -XL) 50 mg, oral, Daily, Do not crush or chew    omeprazole  (PRILOSEC ) 20 mg, oral, Daily before breakfast, Do not crush or chew.    predniSONE  (DELTASONE ) 50 mg,  oral, Daily    triamterene -hydrochlorothiazid (Maxzide -25mg ) 37.5-25 mg tablet 1 tablet, oral, Daily    Wegovy  2.4 mg, subcutaneous, Weekly       Allergies[3]    Exam  Ht Readings from Last 1 Encounters:   07/09/24 1.803 m     Wt Readings from Last 1 Encounters:   07/09/24 111.1 kg     BMI Readings from Last 1 Encounters:   07/09/24 34.17 kg/m     Comfortable. Normal mood. Cooperative with exam.     Diagnostic Tests  Lab Results   Component Value Date    URICACID 7.3 07/08/2024     Lab Results   Component Value Date    CRP 0.35 07/08/2024     Lab Results   Component Value Date    WBC 5.7 07/08/2024    HGB 15.2 07/08/2024    HCT 42.9 07/08/2024    MCV 90.3 07/08/2024    PLT 169 07/08/2024     Lab Results   Component Value Date    GLUCOSE 117 07/08/2024    CALCIUM 9.3  07/08/2024    NA 138 07/08/2024    K 3.6 07/08/2024    CO2 24 07/08/2024    CL 108 07/08/2024    BUN 20 07/08/2024    CREATININE 1.08 07/08/2024         Assessment & Plan  Finger pain, left [M79.645]   1. Finger pain, left      Sudden onset left index finger pain localized to the level of the proximal phalanx volarly may have been due to gout even though he had a normal uric acid level.  His labs and exam do not indicate any infection.  He is already doing better on oral steroids.    He may also have some flexor tendinitis.  There are no signs of trigger finger on exam.    Options include observation, a steroid injection, a radial gutter splint to force him to rest his fingers.    Since he is doing better he is going to finish out his oral steroids.  He is going to see how he does over the next few days.  If pain returns or gets worse he should return and we can consider some the options I listed above.  If he gets better and better then he can use his hand as tolerated.    Signed by:  Kyliana Standen, MD  [email or web address]   Dictated with voice recognition. Please contact me if you feel there are any errors.          [1]   Past Medical  History:  Diagnosis Date    Fatty liver     History of gout     Primary hypertension    [2]   Past Surgical History:  Procedure Laterality Date    CARPAL TUNNEL RELEASE Left 2025    endoscopic    ESOPHAGOGASTRODUODENOSCOPY  2023    INGUINAL HERNIA REPAIR      KNEE ARTHROSCOPY W/ DEBRIDEMENT Right     x2    LEFT COLECTOMY      Sigmoid    ROTATOR CUFF REPAIR Right    [3]   Allergies  Allergen Reactions    Ace Inhibitors Angioedema

## 2024-07-18 ENCOUNTER — Encounter: Payer: PRIVATE HEALTH INSURANCE | Attending: Orthopaedic Surgery | Primary: Family

## 2024-07-22 ENCOUNTER — Encounter: Payer: PRIVATE HEALTH INSURANCE | Attending: Physical Medicine & Rehabilitation | Primary: Family

## 2024-07-25 ENCOUNTER — Encounter: Payer: PRIVATE HEALTH INSURANCE | Primary: Family
